# Patient Record
Sex: Female | Born: 1935 | Race: White | Hispanic: No | Marital: Married | Smoking: Never smoker
Health system: Southern US, Community
[De-identification: ages and names within clinical notes are randomized; demographics above are authoritative.]

## PROBLEM LIST (undated history)

## (undated) DIAGNOSIS — I4891 Unspecified atrial fibrillation: Secondary | ICD-10-CM

## (undated) DIAGNOSIS — N390 Urinary tract infection, site not specified: Secondary | ICD-10-CM

## (undated) DIAGNOSIS — M1990 Unspecified osteoarthritis, unspecified site: Secondary | ICD-10-CM

## (undated) DIAGNOSIS — I509 Heart failure, unspecified: Secondary | ICD-10-CM

## (undated) DIAGNOSIS — I10 Essential (primary) hypertension: Secondary | ICD-10-CM

## (undated) DIAGNOSIS — J189 Pneumonia, unspecified organism: Secondary | ICD-10-CM

## (undated) HISTORY — PX: CARDIOVERSION: SHX1299

## (undated) HISTORY — PX: ABDOMINAL HYSTERECTOMY: SHX81

## (undated) HISTORY — PX: HAND SURGERY: SHX662

## (undated) HISTORY — PX: BREAST REDUCTION SURGERY: SHX8

---

## 2011-08-12 ENCOUNTER — Emergency Department (HOSPITAL_BASED_OUTPATIENT_CLINIC_OR_DEPARTMENT_OTHER)
Admission: EM | Admit: 2011-08-12 | Discharge: 2011-08-12 | Disposition: A | Discharge status: Home / Self Care | Payer: Medicare Other | Attending: Emergency Medicine | Admitting: Emergency Medicine

## 2011-08-12 ENCOUNTER — Encounter: Payer: Self-pay | Admitting: *Deleted

## 2011-08-12 ENCOUNTER — Emergency Department (INDEPENDENT_AMBULATORY_CARE_PROVIDER_SITE_OTHER): Payer: Medicare Other

## 2011-08-12 DIAGNOSIS — M25512 Pain in left shoulder: Secondary | ICD-10-CM

## 2011-08-12 DIAGNOSIS — M25519 Pain in unspecified shoulder: Secondary | ICD-10-CM | POA: Insufficient documentation

## 2011-08-12 DIAGNOSIS — I4891 Unspecified atrial fibrillation: Secondary | ICD-10-CM | POA: Insufficient documentation

## 2011-08-12 DIAGNOSIS — T458X1A Poisoning by other primarily systemic and hematological agents, accidental (unintentional), initial encounter: Secondary | ICD-10-CM | POA: Insufficient documentation

## 2011-08-12 DIAGNOSIS — T45511A Poisoning by anticoagulants, accidental (unintentional), initial encounter: Secondary | ICD-10-CM | POA: Insufficient documentation

## 2011-08-12 DIAGNOSIS — M7989 Other specified soft tissue disorders: Secondary | ICD-10-CM | POA: Insufficient documentation

## 2011-08-12 DIAGNOSIS — I509 Heart failure, unspecified: Secondary | ICD-10-CM | POA: Insufficient documentation

## 2011-08-12 DIAGNOSIS — M898X9 Other specified disorders of bone, unspecified site: Secondary | ICD-10-CM

## 2011-08-12 DIAGNOSIS — Z79899 Other long term (current) drug therapy: Secondary | ICD-10-CM | POA: Insufficient documentation

## 2011-08-12 DIAGNOSIS — I10 Essential (primary) hypertension: Secondary | ICD-10-CM | POA: Insufficient documentation

## 2011-08-12 HISTORY — DX: Unspecified osteoarthritis, unspecified site: M19.90

## 2011-08-12 HISTORY — DX: Unspecified atrial fibrillation: I48.91

## 2011-08-12 HISTORY — DX: Essential (primary) hypertension: I10

## 2011-08-12 HISTORY — DX: Heart failure, unspecified: I50.9

## 2011-08-12 HISTORY — DX: Pneumonia, unspecified organism: J18.9

## 2011-08-12 HISTORY — DX: Urinary tract infection, site not specified: N39.0

## 2011-08-12 LAB — DIFFERENTIAL
Eosinophils Relative: 2 % (ref 0–5)
Lymphocytes Relative: 24 % (ref 12–46)
Lymphs Abs: 1.6 10*3/uL (ref 0.7–4.0)
Monocytes Absolute: 0.9 10*3/uL (ref 0.1–1.0)

## 2011-08-12 LAB — CBC
HCT: 33.1 % — ABNORMAL LOW (ref 36.0–46.0)
Hemoglobin: 10.3 g/dL — ABNORMAL LOW (ref 12.0–15.0)
MCV: 95.9 fL (ref 78.0–100.0)
Platelets: 176 10*3/uL (ref 150–400)
RBC: 3.45 MIL/uL — ABNORMAL LOW (ref 3.87–5.11)
WBC: 6.8 10*3/uL (ref 4.0–10.5)

## 2011-08-12 LAB — BASIC METABOLIC PANEL
CO2: 32 mEq/L (ref 19–32)
Calcium: 9.4 mg/dL (ref 8.4–10.5)
Glucose, Bld: 100 mg/dL — ABNORMAL HIGH (ref 70–99)
Sodium: 142 mEq/L (ref 135–145)

## 2011-08-12 LAB — PROTIME-INR: INR: 3.32 — ABNORMAL HIGH (ref 0.00–1.49)

## 2011-08-12 NOTE — ED Provider Notes (Signed)
History     CSN: [id number]  Arrival date & time 08/12/11  1614   First MD Initiated Contact with Patient 08/12/11 1919      Chief Complaint  Patient presents with  . Bleeding/Bruising    (Consider location/radiation/quality/duration/timing/severity/associated sxs/prior treatment) HPI Comments: Pt doesn't recall any trauma to hand.  She was washing dishes ~ 1500 today and noticed that her L hand was swollen and bruised.  She denies pain.  Last INR check was over 2 weeks ago.  Also, has L shoulder pain.  Seen by PCP yest and had EKG which she was told "showed nothing".  The history is provided by the patient and the spouse. No language interpreter was used.    Past Medical History  Diagnosis Date  . Atrial fibrillation   . CHF (congestive heart failure)   . Arthritis   . Hypertension   . UTI (lower urinary tract infection)   . Pneumonia     Past Surgical History  Procedure Date  . Cardioversion   . Breast reduction surgery   . Abdominal hysterectomy   . Hand surgery     History reviewed. No pertinent family history.  History  Substance Use Topics  . Smoking status: Never Smoker   . Smokeless tobacco: Never Used  . Alcohol Use: No    OB History    Grav Para Term Preterm Abortions TAB SAB Ect Mult Living                  Review of Systems  Musculoskeletal:       Swelling and bruising.  Also, L shoulder pain  All other systems reviewed and are negative.    Allergies  Ace inhibitors; Colchicine; Crestor; Hydrocodone-acetaminophen; Lipitor; Nsaids; Pravachol; Tramadol; and Zocor  Home Medications   Current Outpatient Rx  Name Route Sig Dispense Refill  . ACETAMINOPHEN 325 MG PO TABS Oral Take 325-650 mg by mouth every 6 (six) hours as needed. For pain     . AMIODARONE HCL 400 MG PO TABS Oral Take 400 mg by mouth daily.      Marland Kitchen CALCIUM CARBONATE-VITAMIN D 600-200 MG-UNIT PO TABS Oral Take 1 tablet by mouth 2 (two) times daily.      Marland Kitchen VITAMIN D 1000 UNITS  PO TABS Oral Take 1,000 Units by mouth daily.      . OMEGA-3 FATTY ACIDS 1000 MG PO CAPS Oral Take 2 g by mouth 2 (two) times daily.      Marland Kitchen LEVOTHYROXINE SODIUM 50 MCG PO TABS Oral Take 50 mcg by mouth daily.      . ADULT MULTIVITAMIN W/MINERALS CH Oral Take 1 tablet by mouth daily.      Marland Kitchen MELATONIN PO Oral Take 2 mcg by mouth at bedtime.      Marland Kitchen POLYETHYLENE GLYCOL 3350 PO POWD Oral Take 17 g by mouth daily.      Marland Kitchen POLYSACCHARIDE IRON 150 MG PO CAPS Oral Take 150 mg by mouth daily.      . RED YEAST RICE 600 MG PO CAPS Oral Take 1,200 mg by mouth daily.      . TORSEMIDE 20 MG PO TABS Oral Take 60 mg by mouth daily.     . WARFARIN SODIUM 4 MG PO TABS Oral Take 2-4 mg by mouth daily. Take 1 tab on Monday, Tuesday, Wednesday, Thursday and Friday and take 1/2 tab on Saturday and Sunday     . NITROGLYCERIN 0.4 MG SL SUBL Sublingual Place 0.4 mg under  the tongue every 5 (five) minutes as needed. For chest pain       BP 142/76  Pulse 71  Temp(Src) 97.7 F (36.5 C) (Oral)  Resp 16  SpO2 97%  Physical Exam  Nursing note and vitals reviewed. Constitutional: She is oriented to person, place, and time. She appears well-developed and well-nourished. No distress.  HENT:  Head: Normocephalic and atraumatic.  Eyes: EOM are normal.  Neck: Normal range of motion.  Cardiovascular: Normal rate, regular rhythm and normal heart sounds.   Pulmonary/Chest: Effort normal and breath sounds normal.  Abdominal: Soft. She exhibits no distension. There is no tenderness.  Musculoskeletal: She exhibits no tenderness.       Left shoulder: She exhibits decreased range of motion, tenderness, bony tenderness and pain. She exhibits no deformity, no spasm and normal strength.       Left hand: She exhibits swelling. She exhibits normal range of motion, no tenderness, no bony tenderness, normal capillary refill, no deformity and no laceration. normal sensation noted. Normal strength noted.       Hands: Neurological: She is  alert and oriented to person, place, and time.  Skin: Skin is warm and dry.  Psychiatric: She has a normal mood and affect. Judgment normal.    ED Course  Procedures (including critical care time)  Labs Reviewed  CBC - Abnormal; Notable for the following:    RBC 3.45 (*)    Hemoglobin 10.3 (*)    HCT 33.1 (*)    All other components within normal limits  DIFFERENTIAL - Abnormal; Notable for the following:    Monocytes Relative 13 (*)    All other components within normal limits  BASIC METABOLIC PANEL - Abnormal; Notable for the following:    Glucose, Bld 100 (*)    BUN 57 (*)    Creatinine, Ser 2.50 (*)    GFR calc non Af Amer 16 (*)    GFR calc Af Amer 19 (*)    All other components within normal limits  PROTIME-INR - Abnormal; Notable for the following:    Prothrombin Time 34.2 (*)    INR 3.32 (*)    All other components within normal limits   Dg Shoulder Left  08/12/2011  *RADIOLOGY REPORT*  Clinical Data: Left shoulder pain, no known injury  LEFT SHOULDER - 2+ VIEW  Comparison: None  Findings: AC joint alignment normal. Diffuse osseous demineralization. Prominent inferior acromial spur. No glenohumeral fracture or dislocation. Visualized left ribs intact. Bronchitic changes identified.  IMPRESSION: No acute bony abnormalities. Osseous demineralization. Prominent inferior acromial spur, which may predispose the patient to rotator cuff pathology.  Original Report Authenticated By: Lollie Marrow, M.D.     No diagnosis found.    MDM         Worthy Rancher, PA 08/12/11 2111

## 2011-08-12 NOTE — ED Notes (Signed)
Pt reports left arm numb yesterday- then later in the day began having pain- left hand had small bruise- went to PCP and had ekg done yesterday- today woke up with marked swelling and bruising to left- pt on coumadin

## 2011-08-13 NOTE — ED Provider Notes (Signed)
Medical screening examination/treatment/procedure(s) were performed by non-physician practitioner and as supervising physician I was immediately available for consultation/collaboration.   Rolan Bucco, MD 08/13/11 [phone]

## 2012-08-23 ENCOUNTER — Encounter (HOSPITAL_COMMUNITY): Payer: Self-pay | Admitting: *Deleted

## 2012-08-23 ENCOUNTER — Emergency Department (HOSPITAL_COMMUNITY)
Admission: EM | Admit: 2012-08-23 | Discharge: 2012-08-23 | Disposition: A | Discharge status: Home / Self Care | Payer: Medicare Other | Attending: Emergency Medicine | Admitting: Emergency Medicine

## 2012-08-23 ENCOUNTER — Emergency Department (HOSPITAL_COMMUNITY): Payer: Medicare Other

## 2012-08-23 DIAGNOSIS — Z87440 Personal history of urinary (tract) infections: Secondary | ICD-10-CM | POA: Insufficient documentation

## 2012-08-23 DIAGNOSIS — Z79899 Other long term (current) drug therapy: Secondary | ICD-10-CM | POA: Insufficient documentation

## 2012-08-23 DIAGNOSIS — W19XXXA Unspecified fall, initial encounter: Secondary | ICD-10-CM

## 2012-08-23 DIAGNOSIS — Y939 Activity, unspecified: Secondary | ICD-10-CM | POA: Insufficient documentation

## 2012-08-23 DIAGNOSIS — T148XXA Other injury of unspecified body region, initial encounter: Secondary | ICD-10-CM | POA: Insufficient documentation

## 2012-08-23 DIAGNOSIS — Z8739 Personal history of other diseases of the musculoskeletal system and connective tissue: Secondary | ICD-10-CM | POA: Insufficient documentation

## 2012-08-23 DIAGNOSIS — I509 Heart failure, unspecified: Secondary | ICD-10-CM | POA: Insufficient documentation

## 2012-08-23 DIAGNOSIS — Z8701 Personal history of pneumonia (recurrent): Secondary | ICD-10-CM | POA: Insufficient documentation

## 2012-08-23 DIAGNOSIS — I10 Essential (primary) hypertension: Secondary | ICD-10-CM | POA: Insufficient documentation

## 2012-08-23 DIAGNOSIS — Z8679 Personal history of other diseases of the circulatory system: Secondary | ICD-10-CM | POA: Insufficient documentation

## 2012-08-23 DIAGNOSIS — R296 Repeated falls: Secondary | ICD-10-CM | POA: Insufficient documentation

## 2012-08-23 DIAGNOSIS — Y929 Unspecified place or not applicable: Secondary | ICD-10-CM | POA: Insufficient documentation

## 2012-08-23 NOTE — ED Provider Notes (Signed)
History  This chart was scribed for non-physician practitioner working with Celene Kras, MD by Ardeen Jourdain, ED Scribe. This patient was seen in room TR11C/TR11C and the patient's care was started at 1852.  CSN: [id number]  Arrival date & time 08/23/12  1740   First MD Initiated Contact with Patient 08/23/12 1852      Chief Complaint  Patient presents with  . Fall     The history is provided by the patient. No language interpreter was used.    Jennifer Bender is a 79 y.o. female who presents to the Emergency Department complaining of left upper arm pain from a fall. She states she was trying to sit on a stool when it rolled backwards and she fell straight to the ground. She denies any LOC, head injury and any other injuries at this time. She states she is ambulating normally and with out pain.     Past Medical History  Diagnosis Date  . Atrial fibrillation   . CHF (congestive heart failure)   . Arthritis   . Hypertension   . UTI (lower urinary tract infection)   . Pneumonia     Past Surgical History  Procedure Date  . Cardioversion   . Breast reduction surgery   . Abdominal hysterectomy   . Hand surgery     History reviewed. No pertinent family history.  History  Substance Use Topics  . Smoking status: Never Smoker   . Smokeless tobacco: Never Used  . Alcohol Use: No   No OB history available.   Review of Systems  Musculoskeletal:       Left upper arm pain  Skin:       Bruising to left upper arm  All other systems reviewed and are negative.    Allergies  Ace inhibitors; Colchicine; Crestor; Hydrocodone-acetaminophen; Lipitor; Nsaids; Pravachol; Tramadol; and Zocor  Home Medications   Current Outpatient Rx  Name  Route  Sig  Dispense  Refill  . ACETAMINOPHEN 325 MG PO TABS   Oral   Take 325-650 mg by mouth every 6 (six) hours as needed. For pain          . AMIODARONE HCL 400 MG PO TABS   Oral   Take 400 mg by mouth daily.           Marland Kitchen  CALCIUM CARBONATE-VITAMIN D 600-200 MG-UNIT PO TABS   Oral   Take 1 tablet by mouth 2 (two) times daily.           Marland Kitchen VITAMIN D 1000 UNITS PO TABS   Oral   Take 1,000 Units by mouth daily.           . OMEGA-3 FATTY ACIDS 1000 MG PO CAPS   Oral   Take 2 g by mouth 2 (two) times daily.           Marland Kitchen LEVOTHYROXINE SODIUM 50 MCG PO TABS   Oral   Take 50 mcg by mouth daily.           . ADULT MULTIVITAMIN W/MINERALS CH   Oral   Take 1 tablet by mouth daily.           Marland Kitchen NITROGLYCERIN 0.4 MG SL SUBL   Sublingual   Place 0.4 mg under the tongue every 5 (five) minutes as needed. For chest pain          . POLYSACCHARIDE IRON 150 MG PO CAPS   Oral   Take 150 mg by mouth daily.           Marland Kitchen  RED YEAST RICE 600 MG PO CAPS   Oral   Take 1,200 mg by mouth daily.           . TORSEMIDE 20 MG PO TABS   Oral   Take 60 mg by mouth daily.            Triage Vitals: BP 113/29  Pulse 64  Resp 14  SpO2 99%  Physical Exam  Nursing note and vitals reviewed. Constitutional: She is oriented to person, place, and time. She appears well-developed and well-nourished. No distress.  HENT:  Head: Normocephalic and atraumatic.  Eyes: EOM are normal. Pupils are equal, round, and reactive to light.  Neck: Normal range of motion. Neck supple. No tracheal deviation present.  Cardiovascular: Normal rate.   Pulmonary/Chest: Effort normal. No respiratory distress.  Abdominal: Soft. She exhibits no distension.  Musculoskeletal: Normal range of motion. She exhibits no edema.       Left arm TTP over biceps, with mild bruising, ROM 5/5, strength 5/5,   Neurological: She is alert and oriented to person, place, and time.  Skin: Skin is warm and dry.  Psychiatric: She has a normal mood and affect. Her behavior is normal.    ED Course  Procedures (including critical care time)  DIAGNOSTIC STUDIES: Oxygen Saturation is 99% on room air, normal by my interpretation.    COORDINATION OF  CARE:  7:22 PM: Discussed treatment plan which includes x-ray of the area with pt at bedside and pt agreed to plan.  7:29 PM: Rechecked BP it is 124/58, pt is asymptomatic for lightheadedness and dizziness. Pt has normal gait.    Labs Reviewed - No data to display Dg Humerus Left  08/23/2012  *RADIOLOGY REPORT*  Clinical Data: Larey Seat and injured left upper arm.  LEFT HUMERUS - 2+ VIEW  Comparison: Left shoulder x-rays 08/12/2011.  No prior humerus imaging.  Findings: No acute fractures involving the humerus.  Generalized loss of bone mineral density.  Mild degenerative changes involving the shoulder as noted previously.  IMPRESSION: No acute osseous abnormality.  Osseous demineralization.   Original Report Authenticated By: Hulan Saas, M.D.      1. Fall   2. Muscle strain       MDM  This is an 79 year old female, who fell out of a chair, as it rolled backwards. She is complaining of upper arm pain, which was likely due to catching herself. Plain films are negative. Patient had low blood pressure on initial reading, but when taken manually blood pressure was improved. She is asymptomatic. Feel that she is stable and ready for discharge.      I personally performed the services described in this documentation, which was scribed in my presence. The recorded information has been reviewed and is accurate.     Roxy Horseman, PA-C 08/23/12 1935

## 2012-08-23 NOTE — ED Notes (Signed)
Reports visiting family upstairs, went to sit on a stool and it rolled backwards and she fell. Only complaint is left upper arm pain, denies hitting her head or loc. Bruising noted, no obv deformity noted, +left radial pulse present.

## 2012-08-24 NOTE — ED Provider Notes (Signed)
Medical screening examination/treatment/procedure(s) were performed by non-physician practitioner and as supervising physician I was immediately available for consultation/collaboration.    Esmeralda Blanford R Lewie Deman, MD 08/24/12 0030 

## 2018-02-20 ENCOUNTER — Other Ambulatory Visit: Payer: Self-pay

## 2018-02-20 ENCOUNTER — Emergency Department (HOSPITAL_COMMUNITY)
Admission: EM | Admit: 2018-02-20 | Discharge: 2018-02-20 | Disposition: A | Discharge status: Home / Self Care | Payer: Medicare Other | Attending: Emergency Medicine | Admitting: Emergency Medicine

## 2018-02-20 ENCOUNTER — Emergency Department (HOSPITAL_COMMUNITY): Payer: Medicare Other

## 2018-02-20 ENCOUNTER — Encounter (HOSPITAL_COMMUNITY): Payer: Self-pay

## 2018-02-20 DIAGNOSIS — S0990XA Unspecified injury of head, initial encounter: Secondary | ICD-10-CM | POA: Diagnosis present

## 2018-02-20 DIAGNOSIS — Y999 Unspecified external cause status: Secondary | ICD-10-CM | POA: Insufficient documentation

## 2018-02-20 DIAGNOSIS — Y9301 Activity, walking, marching and hiking: Secondary | ICD-10-CM | POA: Diagnosis not present

## 2018-02-20 DIAGNOSIS — Y92129 Unspecified place in nursing home as the place of occurrence of the external cause: Secondary | ICD-10-CM | POA: Diagnosis not present

## 2018-02-20 DIAGNOSIS — M25522 Pain in left elbow: Secondary | ICD-10-CM | POA: Diagnosis not present

## 2018-02-20 DIAGNOSIS — I110 Hypertensive heart disease with heart failure: Secondary | ICD-10-CM | POA: Diagnosis not present

## 2018-02-20 DIAGNOSIS — S0191XA Laceration without foreign body of unspecified part of head, initial encounter: Secondary | ICD-10-CM | POA: Insufficient documentation

## 2018-02-20 DIAGNOSIS — F0390 Unspecified dementia without behavioral disturbance: Secondary | ICD-10-CM | POA: Insufficient documentation

## 2018-02-20 DIAGNOSIS — S0083XA Contusion of other part of head, initial encounter: Secondary | ICD-10-CM

## 2018-02-20 DIAGNOSIS — Z79899 Other long term (current) drug therapy: Secondary | ICD-10-CM | POA: Diagnosis not present

## 2018-02-20 DIAGNOSIS — I509 Heart failure, unspecified: Secondary | ICD-10-CM | POA: Insufficient documentation

## 2018-02-20 DIAGNOSIS — W0110XA Fall on same level from slipping, tripping and stumbling with subsequent striking against unspecified object, initial encounter: Secondary | ICD-10-CM | POA: Insufficient documentation

## 2018-02-20 DIAGNOSIS — T148XXA Other injury of unspecified body region, initial encounter: Secondary | ICD-10-CM

## 2018-02-20 LAB — BASIC METABOLIC PANEL
Anion gap: 11 (ref 5–15)
BUN: 64 mg/dL — AB (ref 8–23)
CHLORIDE: 99 mmol/L (ref 98–111)
CO2: 28 mmol/L (ref 22–32)
CREATININE: 2.18 mg/dL — AB (ref 0.44–1.00)
Calcium: 9 mg/dL (ref 8.9–10.3)
GFR calc Af Amer: 21 mL/min — ABNORMAL LOW (ref >60)
GFR, EST NON AFRICAN AMERICAN: 18 mL/min — AB (ref >60)
GLUCOSE: 136 mg/dL — AB (ref 70–99)
POTASSIUM: 4.4 mmol/L (ref 3.5–5.1)
SODIUM: 138 mmol/L (ref 135–145)

## 2018-02-20 LAB — CBC WITH DIFFERENTIAL/PLATELET
BASOS ABS: 0 10*3/uL (ref 0.0–0.1)
Basophils Relative: 0 %
Eosinophils Absolute: 0.1 10*3/uL (ref 0.0–0.7)
Eosinophils Relative: 2 %
HCT: 36.2 % (ref 36.0–46.0)
Hemoglobin: 11.7 g/dL — ABNORMAL LOW (ref 12.0–15.0)
LYMPHS PCT: 15 %
Lymphs Abs: 1.4 10*3/uL (ref 0.7–4.0)
MCH: 31.9 pg (ref 26.0–34.0)
MCHC: 32.3 g/dL (ref 30.0–36.0)
MCV: 98.6 fL (ref 78.0–100.0)
MONO ABS: 1.1 10*3/uL — AB (ref 0.1–1.0)
Monocytes Relative: 12 %
Neutro Abs: 6.7 10*3/uL (ref 1.7–7.7)
Neutrophils Relative %: 71 %
PLATELETS: 210 10*3/uL (ref 150–400)
RBC: 3.67 MIL/uL — ABNORMAL LOW (ref 3.87–5.11)
RDW: 13.4 % (ref 11.5–15.5)
WBC: 9.3 10*3/uL (ref 4.0–10.5)

## 2018-02-20 MED ORDER — SODIUM CHLORIDE 0.9 % IV BOLUS
500.0000 mL | Freq: Once | INTRAVENOUS | Status: AC
  Administered 2018-02-20: 500 mL via INTRAVENOUS

## 2018-02-20 MED ORDER — FENTANYL CITRATE (PF) 100 MCG/2ML IJ SOLN
50.0000 ug | Freq: Once | INTRAMUSCULAR | Status: AC
  Administered 2018-02-20: 50 ug via INTRAVENOUS
  Filled 2018-02-20: qty 2

## 2018-02-20 NOTE — ED Provider Notes (Signed)
[facility]-EMERGENCY DEPT Provider Note   CSN: [id number] Arrival date & time: 02/20/18  1725     History   Chief Complaint Chief Complaint  Patient presents with  . Fall    HPI Jennifer Bender is a 84 y.o. female with PMH/o Arthritis, Afib, CHF, HTN, Pneumonia BIB EMS who presents for evaluation of mechanical fall that occurred just prior to ED arrival.  Patient reports that she was walking back into the nursing home facility when she tripped over the cement, causing her to fall forward.  Unclear if any LOC.  Patient states she did not have any preceding chest pain or dizziness prior to onset of fall.  Patient reports that she landed on her knees and then her face and elbow. Patient reports that when she initially was evaluated by EMS she had some neck pain but states not anymore. Denies any back pain. On ED arrival, patient is complaining of headache, facial pain.  Additionally, she has left elbow and upper arm pain.  Patient reports that she hit both of her knees when she fell and that they are both causing her pain.  She feels like they are more swollen than they normally are.  Patient is not currently on blood thinners.  Patient denies any vision changes, chest pain, difficulty breathing, abdominal pain, nausea/vomiting, numbness/weakness of her arms or legs.  The history is provided by the patient.    Past Medical History:  Diagnosis Date  . Arthritis   . Atrial fibrillation (HCC)   . CHF (congestive heart failure) (HCC)   . Hypertension   . Pneumonia   . UTI (lower urinary tract infection)     There are no active problems to display for this patient.   Past Surgical History:  Procedure Laterality Date  . ABDOMINAL HYSTERECTOMY    . BREAST REDUCTION SURGERY    . CARDIOVERSION    . HAND SURGERY       OB History   None      Home Medications    Prior to Admission medications   Medication Sig Start Date End Date Taking? Authorizing  Provider  diltiazem (DILACOR XR) 240 MG 24 hr capsule Take 240 mg by mouth daily.   Yes [provider]  donepezil (ARICEPT) 10 MG tablet Take 10 mg by mouth daily.   Yes [provider]  DULoxetine (CYMBALTA) 30 MG capsule Take 30 mg by mouth daily.   Yes [provider]  fish oil-omega-3 fatty acids 1000 MG capsule Take 1 g by mouth daily.    Yes [provider]  levothyroxine (SYNTHROID, LEVOTHROID) 88 MCG tablet Take 88 mcg by mouth daily before breakfast.   Yes [provider]  Melatonin 5 MG TABS Take 5 mg by mouth at bedtime as needed (sleep).   Yes [provider]  Multiple Vitamin (MULITIVITAMIN WITH MINERALS) TABS Take 1 tablet by mouth daily.     Yes [provider]  potassium chloride (K-DUR,KLOR-CON) 10 MEQ tablet Take 10 mEq by mouth daily.   Yes [provider]  torsemide (DEMADEX) 20 MG tablet Take 40 mg by mouth 2 (two) times daily.    Yes [provider]    Family History History reviewed. No pertinent family history.  Social History Social History   Tobacco Use  . Smoking status: Never Smoker  . Smokeless tobacco: Never Used  Substance Use Topics  . Alcohol use: No  . Drug use: No     Allergies  Ace inhibitors; Colchicine; Crestor [rosuvastatin calcium]; Hydrocodone-acetaminophen; Lipitor [atorvastatin calcium]; Morphine and related; Nsaids; Pravachol; Rosuvastatin; Tramadol; and Zocor [simvastatin]   Review of Systems Review of Systems  Constitutional: Negative for fever.  Respiratory: Negative for cough and shortness of breath.   Cardiovascular: Negative for chest pain.  Gastrointestinal: Negative for abdominal pain, nausea and vomiting.  Genitourinary: Negative for dysuria and hematuria.  Musculoskeletal: Positive for neck pain. Negative for back pain.       Left arm pain, bilateral knee pain  Neurological: Positive for headaches.     Physical Exam Updated Vital  Signs BP (!) 128/108 (BP Location: Right Arm)   Pulse 66   Temp 98.6 F (37 C) (Oral)   Resp 14   SpO2 98%   Physical Exam  Constitutional: She is oriented to person, place, and time. She appears well-developed and well-nourished.  HENT:  Head: Normocephalic and atraumatic.    Nose: Sinus tenderness present. No nasal septal hematoma.  Mouth/Throat: Oropharynx is clear and moist and mucous membranes are normal.  No skull deformities or crepitus noted.  Dried blood noted on the left side but no underlying wound, abrasion, laceration.  Tenderness to palpation noted to the nasal bridge with some obvious swelling.  No evidence of septal hematoma.  Eyes: Pupils are equal, round, and reactive to light. Conjunctivae, EOM and lids are normal.  EOMs intact without any difficulty.  Neck:  C-collar in place.  Cardiovascular: Normal rate, regular rhythm, normal heart sounds and normal pulses. Exam reveals no gallop and no friction rub.  No murmur heard. Pulses:      Radial pulses are 2+ on the right side, and 2+ on the left side.       Dorsalis pedis pulses are 2+ on the right side, and 2+ on the left side.  Pulmonary/Chest: Effort normal and breath sounds normal.  Lungs clear to auscultation bilaterally.  Symmetric chest rise.  No wheezing, rales, rhonchi.  Abdominal: Soft. Normal appearance. There is no tenderness. There is no rigidity and no guarding.  Musculoskeletal: Normal range of motion.  Tenderness palpation noted to left humerus, left elbow, left forearm.  No deformity or crepitus noted.  Limited range of motion secondary to tenderness.  Skin tear noted to the lateral aspect of the left forearm.  No tenderness palpation noted to wrist, hand.  No tenderness noted to the right upper extremity.  Tenderness palpation noted to the right knee with some overlying soft tissue swelling that appears to be chronic in nature.  No deformity or crepitus noted.  Flexion/extension intact.  Tenderness  palpation noted to the left knee with some overlying soft tissue swelling that appears to be chronic in nature.  No deformity or crepitus noted.  Flexion/extension of left lower extremity intact any difficulty.  No tenderness palpation to distal tib-fib, ankles bilaterally.  Neurological: She is alert and oriented to person, place, and time.  Cranial nerves III-XII intact Follows commands, Moves all extremities  5/5 strength to BUE and BLE  Sensation intact throughout all major nerve distributions Normal finger to nose. No slurred speech. No facial droop.   Skin: Skin is warm and dry. Capillary refill takes less than 2 seconds.  Skin tear noted to left upper extremity.  Small 0.5 cm wound noted to the frontal scalp.  No evidence of deep laceration.  Oozing after cleaning.  Psychiatric: She has a normal mood and affect. Her speech is normal.  Nursing note and vitals reviewed.    ED Treatments /  Results  Labs (all labs ordered are listed, but only abnormal results are displayed) Labs Reviewed  CBC WITH DIFFERENTIAL/PLATELET - Abnormal; Notable for the following components:      Result Value   RBC 3.67 (*)    Hemoglobin 11.7 (*)    Monocytes Absolute 1.1 (*)    All other components within normal limits  BASIC METABOLIC PANEL - Abnormal; Notable for the following components:   Glucose, Bld 136 (*)    BUN 64 (*)    Creatinine, Ser 2.18 (*)    GFR calc non Af Amer 18 (*)    GFR calc Af Amer 21 (*)    All other components within normal limits    EKG EKG Interpretation  Date/Time:  Wednesday February 20 2018 19:03:58 EDT Ventricular Rate:  65 PR Interval:    QRS Duration: 156 QT Interval:  431 QTC Calculation: 431 R Axis:   -102 Text Interpretation:  Afib/flut and V-paced complexes No further analysis attempted due to paced rhythm No previous ECGs available Confirmed by Richardean Canal [phone]) on 02/20/2018 7:06:15 PM   Radiology Dg Chest 2 View  Result Date: 02/20/2018 CLINICAL DATA:   Fall EXAM: CHEST - 2 VIEW COMPARISON:  02/21/2016 FINDINGS: Left-sided pacing device as before. Cardiomegaly. Aortic atherosclerosis. No acute airspace disease or pleural effusion. No pneumothorax. Old fracture deformity of the proximal right humerus. Advanced arthritis of the left shoulder. IMPRESSION: No active cardiopulmonary disease.  Cardiomegaly. Electronically Signed   By: Jasmine Pang M.D.   On: 02/20/2018 18:48   Dg Pelvis 1-2 Views  Result Date: 02/20/2018 CLINICAL DATA:  Fall EXAM: PELVIS - 1-2 VIEW COMPARISON:  05/15/2017 FINDINGS: Large feces retention in the rectum. Old deformity of the right inferior pubic ramus. Pubic symphysis is intact. SI joint degenerative changes. No acute displaced fracture or malalignment. Vascular calcifications. IMPRESSION: No acute osseous abnormality. Electronically Signed   By: Jasmine Pang M.D.   On: 02/20/2018 18:50   Dg Elbow Complete Left  Result Date: 02/20/2018 CLINICAL DATA:  Fall with elbow pain EXAM: LEFT ELBOW - COMPLETE 3+ VIEW COMPARISON:  None. FINDINGS: Elbow effusion is present. No dislocation. Small cortex lucency at the radial head neck junction on the lateral view. Vascular calcifications. IMPRESSION: Elbow effusion. Small cortex lucency at the radial head neck junction on single view, possible subtle cortex fracture. Electronically Signed   By: Jasmine Pang M.D.   On: 02/20/2018 18:38   Dg Forearm Left  Result Date: 02/20/2018 CLINICAL DATA:  Fall with arm pain EXAM: LEFT FOREARM - 2 VIEW COMPARISON:  12/04/2015 FINDINGS: Bones appear demineralized. Chronic deformity of the first metacarpal. Moderate arthritis at the first [facility] joint. Possible tiny cortex lucency at the head neck junction of the radius. Otherwise no definitive fracture lucency seen. Vascular calcifications. IMPRESSION: No acute osseous abnormality. A possible cortex lucency at the radial head neck junction is better seen on the elbow views. Electronically Signed   By: Jasmine Pang M.D.   On: 02/20/2018 18:44   Ct Head Wo Contrast  Result Date: 02/20/2018 CLINICAL DATA:  Fall, hit face EXAM: CT HEAD WITHOUT CONTRAST CT MAXILLOFACIAL WITHOUT CONTRAST CT CERVICAL SPINE WITHOUT CONTRAST TECHNIQUE: Multidetector CT imaging of the head, cervical spine, and maxillofacial structures were performed using the standard protocol without intravenous contrast. Multiplanar CT image reconstructions of the cervical spine and maxillofacial structures were also generated. COMPARISON:  05/15/2017 FINDINGS: CT HEAD FINDINGS Brain: There is atrophy and chronic small vessel disease changes. No  acute intracranial abnormality. Specifically, no hemorrhage, hydrocephalus, mass lesion, acute infarction, or significant intracranial injury. Vascular: No hyperdense vessel or unexpected calcification. Skull: No acute calvarial abnormality. Other: Soft tissue swelling over the forehead near the midline. CT MAXILLOFACIAL FINDINGS Osseous: No fracture.  Zygomatic arches and mandible intact. Orbits: Negative. No traumatic or inflammatory finding. Sinuses: Mucosal thickening in the ethmoid air cells, right maxillary and right frontal sinus. No air-fluid levels. Mastoids clear. Soft tissues: Soft tissue swelling over the forehead and nose. CT CERVICAL SPINE FINDINGS Alignment: No subluxation Skull base and vertebrae: Severe chronic compression deformity at the T2 vertebral body, stable since prior study. Mild compression deformity of the C5 superior endplate has progressed since prior study. No acute fracture. Soft tissues and spinal canal: No prevertebral fluid or swelling. No visible canal hematoma. Disc levels: Diffuse degenerative disc disease throughout the cervical spine and degenerative facet disease bilaterally. Upper chest: No acute findings Other: No acute findings IMPRESSION: No acute intracranial abnormality. Atrophy, chronic microvascular disease. Soft tissue swelling over the forehead and nose. No facial  fracture. Diffuse degenerative disc and facet disease in the cervical spine. Chronic compression deformity at T2 is stable. Mild compression deformity through the superior endplate of C5 has progressed since prior study. No acute fracture. Electronically Signed   By: Charlett NoseKevin  Dover M.D.   On: 02/20/2018 19:10   Ct Cervical Spine Wo Contrast  Result Date: 02/20/2018 CLINICAL DATA:  Fall, hit face EXAM: CT HEAD WITHOUT CONTRAST CT MAXILLOFACIAL WITHOUT CONTRAST CT CERVICAL SPINE WITHOUT CONTRAST TECHNIQUE: Multidetector CT imaging of the head, cervical spine, and maxillofacial structures were performed using the standard protocol without intravenous contrast. Multiplanar CT image reconstructions of the cervical spine and maxillofacial structures were also generated. COMPARISON:  05/15/2017 FINDINGS: CT HEAD FINDINGS Brain: There is atrophy and chronic small vessel disease changes. No acute intracranial abnormality. Specifically, no hemorrhage, hydrocephalus, mass lesion, acute infarction, or significant intracranial injury. Vascular: No hyperdense vessel or unexpected calcification. Skull: No acute calvarial abnormality. Other: Soft tissue swelling over the forehead near the midline. CT MAXILLOFACIAL FINDINGS Osseous: No fracture.  Zygomatic arches and mandible intact. Orbits: Negative. No traumatic or inflammatory finding. Sinuses: Mucosal thickening in the ethmoid air cells, right maxillary and right frontal sinus. No air-fluid levels. Mastoids clear. Soft tissues: Soft tissue swelling over the forehead and nose. CT CERVICAL SPINE FINDINGS Alignment: No subluxation Skull base and vertebrae: Severe chronic compression deformity at the T2 vertebral body, stable since prior study. Mild compression deformity of the C5 superior endplate has progressed since prior study. No acute fracture. Soft tissues and spinal canal: No prevertebral fluid or swelling. No visible canal hematoma. Disc levels: Diffuse degenerative disc  disease throughout the cervical spine and degenerative facet disease bilaterally. Upper chest: No acute findings Other: No acute findings IMPRESSION: No acute intracranial abnormality. Atrophy, chronic microvascular disease. Soft tissue swelling over the forehead and nose. No facial fracture. Diffuse degenerative disc and facet disease in the cervical spine. Chronic compression deformity at T2 is stable. Mild compression deformity through the superior endplate of C5 has progressed since prior study. No acute fracture. Electronically Signed   By: Charlett NoseKevin  Dover M.D.   On: 02/20/2018 19:10   Dg Knee Complete 4 Views Left  Result Date: 02/20/2018 CLINICAL DATA:  Fall with knee pain EXAM: LEFT KNEE - COMPLETE 4+ VIEW COMPARISON:  05/15/2017 FINDINGS: No fracture or malalignment. Vascular calcification. Moderate arthritis of the medial compartment with marked arthritis of the lateral compartment. Mild patellofemoral degenerative  change. No significant knee effusion. Vascular calcifications. IMPRESSION: Arthritis of the left knee.  No acute osseous abnormality. Electronically Signed   By: Jasmine Pang M.D.   On: 02/20/2018 18:41   Dg Knee Complete 4 Views Right  Result Date: 02/20/2018 CLINICAL DATA:  Fall with pain EXAM: RIGHT KNEE - COMPLETE 4+ VIEW COMPARISON:  05/15/2017 FINDINGS: Small knee effusion. No acute displaced fracture seen. Surgical plate and multiple screw fixation of the distal femur across old distal femoral fracture deformity. Moderate severe arthritis of the medial and lateral compartments of the knee. Extensive vascular calcification IMPRESSION: Status post surgical plate and screw fixation of old distal femoral fracture. No definite acute osseous abnormality is seen. There is a small knee effusion. Electronically Signed   By: Jasmine Pang M.D.   On: 02/20/2018 18:46   Dg Humerus Left  Result Date: 02/20/2018 CLINICAL DATA:  Fall with arm pain EXAM: LEFT HUMERUS - 2+ VIEW COMPARISON:   05/15/2018 FINDINGS: No acute displaced fracture is seen. Bones appear demineralized. High-riding humeral head compatible with rotator cuff disease. Arthritis at the glenohumeral interval. IMPRESSION: No acute osseous abnormality. High-riding position of the humeral head suspicious for rotator cuff disease. Marked arthritis at the glenohumeral interval. Electronically Signed   By: Jasmine Pang M.D.   On: 02/20/2018 18:40   Ct Maxillofacial Wo Contrast  Result Date: 02/20/2018 CLINICAL DATA:  Fall, hit face EXAM: CT HEAD WITHOUT CONTRAST CT MAXILLOFACIAL WITHOUT CONTRAST CT CERVICAL SPINE WITHOUT CONTRAST TECHNIQUE: Multidetector CT imaging of the head, cervical spine, and maxillofacial structures were performed using the standard protocol without intravenous contrast. Multiplanar CT image reconstructions of the cervical spine and maxillofacial structures were also generated. COMPARISON:  05/15/2017 FINDINGS: CT HEAD FINDINGS Brain: There is atrophy and chronic small vessel disease changes. No acute intracranial abnormality. Specifically, no hemorrhage, hydrocephalus, mass lesion, acute infarction, or significant intracranial injury. Vascular: No hyperdense vessel or unexpected calcification. Skull: No acute calvarial abnormality. Other: Soft tissue swelling over the forehead near the midline. CT MAXILLOFACIAL FINDINGS Osseous: No fracture.  Zygomatic arches and mandible intact. Orbits: Negative. No traumatic or inflammatory finding. Sinuses: Mucosal thickening in the ethmoid air cells, right maxillary and right frontal sinus. No air-fluid levels. Mastoids clear. Soft tissues: Soft tissue swelling over the forehead and nose. CT CERVICAL SPINE FINDINGS Alignment: No subluxation Skull base and vertebrae: Severe chronic compression deformity at the T2 vertebral body, stable since prior study. Mild compression deformity of the C5 superior endplate has progressed since prior study. No acute fracture. Soft tissues and  spinal canal: No prevertebral fluid or swelling. No visible canal hematoma. Disc levels: Diffuse degenerative disc disease throughout the cervical spine and degenerative facet disease bilaterally. Upper chest: No acute findings Other: No acute findings IMPRESSION: No acute intracranial abnormality. Atrophy, chronic microvascular disease. Soft tissue swelling over the forehead and nose. No facial fracture. Diffuse degenerative disc and facet disease in the cervical spine. Chronic compression deformity at T2 is stable. Mild compression deformity through the superior endplate of C5 has progressed since prior study. No acute fracture. Electronically Signed   By: Charlett Nose M.D.   On: 02/20/2018 19:10    Procedures .Marland KitchenLaceration Repair Date/Time: 02/20/2018 10:00 PM Performed by: Maxwell Caul, PA-C Authorized by: Maxwell Caul, PA-C   Consent:    Consent obtained:  Verbal   Consent given by:  Patient   Risks discussed:  Infection, pain, retained foreign body, poor cosmetic result and poor wound healing Anesthesia (see MAR for  exact dosages):    Anesthesia method:  None Laceration details:    Location:  Scalp   Scalp location:  Frontal   Length (cm):  0.5 Repair type:    Repair type:  Simple Treatment:    Area cleansed with:  Soap and water   Amount of cleaning:  Standard   Irrigation solution:  Sterile saline Skin repair:    Repair method:  Tissue adhesive Approximation:    Approximation:  Close Post-procedure details:    Patient tolerance of procedure:  Tolerated well, no immediate complications   (including critical care time)  Medications Ordered in ED Medications  sodium chloride 0.9 % bolus 500 mL (0 mLs Intravenous Stopped 02/20/18 2010)  fentaNYL (SUBLIMAZE) injection 50 mcg (50 mcg Intravenous Given 02/20/18 2008)     Initial Impression / Assessment and Plan / ED Course  I have reviewed the triage vital signs and the nursing notes.  Pertinent labs & imaging results  that were available during my care of the patient were reviewed by me and considered in my medical decision making (see chart for details).     84 year old female with possible history of  BIB EMS for evaluation of mechanical fall that occurred just prior to ED arrival.  Patient states she tripped on the cement after getting off of a car walking into the nursing home.  No preceding chest pain, dizziness.  Unclear if LOC.  She is not on any blood thinners. Patient is afebrile , non-toxic appearing, sitting comfortably on examination table. Vital signs reviewed. Ptient's blood pressure is 94/71.  Vital signs otherwise stable.  On exam, patient with a hematoma with overlying abrasion noted to the right forehead.  She also has some swelling tenderness to nasal bridge.  Tenderness to the left upper extremity.  We will plan for CT head, CT C-spine, CT maxilla facial.  Will get x-ray imaging of left upper extremity and bilateral knees.  Additionally, will order basic labs, EKG given low blood pressure.  CT head shows no evidence of acute intracranial abnormality, skull fracture.  CT maxillofacial shows no evidence of facial fracture.  CT cervical spine shows no evidence of acute cervical fracture.  She has evidence of a compression deformity through the superior endplate of C5 but was seen on previous study.  Pelvis x-ray negative for any acute abnormality.  Chest x-ray negative for any acute abnormality.  X-ray shows small elbow effusion.  There is a small questionable cortex lucency at the radial head neck junction on single view.  Possible subtle cortex fracture.  Humerus x-ray negative for any acute abnormality.  Bilateral knee x-rays are without any acute abnormality.  Given concerns of possible small elbow fracture and the fact the patient has a history of Alzheimer's and will intermittently become confused, discussed with Dr. Silverio Lay feel that the best plan would be to go ahead and splint her in the ED to limit  any movement with plans to follow-up with orthopedics.  Patient had a 0.5 cm small wound that was not extensive enough to require suture repair.  It continued to ooze after cleaning.  A small amount of tissue adhesive was applied.  Patient tolerated procedure well.  Reevaluation after splint placement.  Patient with good distal cap refill.  She can move all 5 digits of her left upper extremity without any difficulty.  Patient stable for discharge back to nursing home.  Instructions provided on patient's AVS for further follow-up with orthopedics.   Final Clinical Impressions(s) /  ED Diagnoses   Final diagnoses:  Contusion of face, initial encounter  Hematoma  Left elbow pain    ED Discharge Orders    None       Rosana Hoes 02/21/18 0014    Charlynne Pander, MD 02/22/18 [phone]

## 2018-02-20 NOTE — Discharge Instructions (Signed)
There was an abnormality seen on her elbow x-ray.  This is why she is in a splint.  She needs further evaluation by the orthopedic doctor.  Call in the next 4 to 5 days to arrange for an appointment.  Elevate the arm to help with swelling.  The splint cannot get wet.  You can take 1000 mg of Tylenol.  Do not exceed 4000 mg of Tylenol a day.  Patient had a small wound noted to her face.  This was repaired with tissue glue.  This will fall off on its own.  Please apply ice to her swelling and bruising.  Her facial bruising will continue to spread over the next few days.  This is normal.  Return the emergency department for any worsening pain of her arm, discoloration of her hands or fingers, swelling or redness of her hands or fingers, chest pain, difficulty breathing or any other worsening or concerning symptoms.

## 2018-02-20 NOTE — ED Notes (Signed)
[facility]

## 2018-02-20 NOTE — ED Notes (Signed)
Cleaned the blood out of patients hair. Will clean patients skin tear and lacerations when patients comes back from radiology.

## 2018-02-20 NOTE — ED Notes (Signed)
Bed: WA17 Expected date:  Expected time:  Means of arrival:  Comments: EMS 

## 2018-02-20 NOTE — ED Notes (Signed)
Pt can not sign for discharge due to mental status at this time.

## 2018-02-20 NOTE — ED Triage Notes (Addendum)
[facility] Patient was getting off the bus and tripped on the curb and landed on her knees and face. No bleeding or signs of injury on her knees. Patient has multiple skin tears on left arm. Laceration in her right eye brow near her nose. Bleeding is controlled. Blood on left side of head that appears to be from blood that ran down the side of her head.Patient denies blood thinners. Hx. CHF, CKD, Alzheimer.

## 2018-02-20 NOTE — ED Notes (Signed)
Patient transported to CT 

## 2018-02-20 NOTE — ED Notes (Addendum)
Patient in radiology. Will get EKG, blood work, and orthostatics when patient is back in room.

## 2018-02-26 ENCOUNTER — Other Ambulatory Visit: Payer: Self-pay

## 2018-02-26 ENCOUNTER — Emergency Department (HOSPITAL_COMMUNITY)
Admission: EM | Admit: 2018-02-26 | Discharge: 2018-02-27 | Disposition: A | Discharge status: Home / Self Care | Payer: Medicare Other | Attending: Emergency Medicine | Admitting: Emergency Medicine

## 2018-02-26 ENCOUNTER — Encounter (HOSPITAL_COMMUNITY): Payer: Self-pay

## 2018-02-26 DIAGNOSIS — S22000A Wedge compression fracture of unspecified thoracic vertebra, initial encounter for closed fracture: Secondary | ICD-10-CM

## 2018-02-26 DIAGNOSIS — Y9389 Activity, other specified: Secondary | ICD-10-CM | POA: Insufficient documentation

## 2018-02-26 DIAGNOSIS — Y998 Other external cause status: Secondary | ICD-10-CM | POA: Diagnosis not present

## 2018-02-26 DIAGNOSIS — S22080A Wedge compression fracture of T11-T12 vertebra, initial encounter for closed fracture: Secondary | ICD-10-CM | POA: Insufficient documentation

## 2018-02-26 DIAGNOSIS — S76011A Strain of muscle, fascia and tendon of right hip, initial encounter: Secondary | ICD-10-CM | POA: Diagnosis not present

## 2018-02-26 DIAGNOSIS — I110 Hypertensive heart disease with heart failure: Secondary | ICD-10-CM | POA: Diagnosis not present

## 2018-02-26 DIAGNOSIS — S79911A Unspecified injury of right hip, initial encounter: Secondary | ICD-10-CM | POA: Diagnosis present

## 2018-02-26 DIAGNOSIS — W010XXA Fall on same level from slipping, tripping and stumbling without subsequent striking against object, initial encounter: Secondary | ICD-10-CM | POA: Insufficient documentation

## 2018-02-26 DIAGNOSIS — Y92129 Unspecified place in nursing home as the place of occurrence of the external cause: Secondary | ICD-10-CM | POA: Insufficient documentation

## 2018-02-26 DIAGNOSIS — F0390 Unspecified dementia without behavioral disturbance: Secondary | ICD-10-CM | POA: Insufficient documentation

## 2018-02-26 DIAGNOSIS — I509 Heart failure, unspecified: Secondary | ICD-10-CM | POA: Insufficient documentation

## 2018-02-26 DIAGNOSIS — Z79899 Other long term (current) drug therapy: Secondary | ICD-10-CM | POA: Insufficient documentation

## 2018-02-26 NOTE — ED Triage Notes (Addendum)
Pt presents to ED due to pain in the right hip from [facility]. Pt experienced a fall and was not able to brace herself. Pt states to EMS she has not taken any pain medication since the fall. Per EMS, pt is A&Ox3, disoriented to time.

## 2018-02-26 NOTE — ED Notes (Signed)
Bed: ZO10WA18 Expected date:  Expected time:  Means of arrival:  Comments: EMS 84 yo fall 1 week ago 155/81

## 2018-02-27 ENCOUNTER — Emergency Department (HOSPITAL_COMMUNITY): Payer: Medicare Other

## 2018-02-27 LAB — CBC WITH DIFFERENTIAL/PLATELET
BASOS ABS: 0 10*3/uL (ref 0.0–0.1)
BASOS PCT: 0 %
Eosinophils Absolute: 0.3 10*3/uL (ref 0.0–0.7)
Eosinophils Relative: 3 %
HEMATOCRIT: 43.9 % (ref 36.0–46.0)
Hemoglobin: 14.3 g/dL (ref 12.0–15.0)
LYMPHS PCT: 13 %
Lymphs Abs: 1.4 10*3/uL (ref 0.7–4.0)
MCH: 32.1 pg (ref 26.0–34.0)
MCHC: 32.6 g/dL (ref 30.0–36.0)
MCV: 98.7 fL (ref 78.0–100.0)
Monocytes Absolute: 1.4 10*3/uL — ABNORMAL HIGH (ref 0.1–1.0)
Monocytes Relative: 13 %
NEUTROS ABS: 7.5 10*3/uL (ref 1.7–7.7)
NEUTROS PCT: 71 %
Platelets: 216 10*3/uL (ref 150–400)
RBC: 4.45 MIL/uL (ref 3.87–5.11)
RDW: 13.6 % (ref 11.5–15.5)
WBC: 10.7 10*3/uL — AB (ref 4.0–10.5)

## 2018-02-27 LAB — BASIC METABOLIC PANEL
ANION GAP: 12 (ref 5–15)
BUN: 68 mg/dL — ABNORMAL HIGH (ref 8–23)
CALCIUM: 9.2 mg/dL (ref 8.9–10.3)
CO2: 31 mmol/L (ref 22–32)
Chloride: 99 mmol/L (ref 98–111)
Creatinine, Ser: 2.12 mg/dL — ABNORMAL HIGH (ref 0.44–1.00)
GFR, EST AFRICAN AMERICAN: 22 mL/min — AB (ref >60)
GFR, EST NON AFRICAN AMERICAN: 19 mL/min — AB (ref >60)
Glucose, Bld: 107 mg/dL — ABNORMAL HIGH (ref 70–99)
POTASSIUM: 4 mmol/L (ref 3.5–5.1)
Sodium: 142 mmol/L (ref 135–145)

## 2018-02-27 LAB — PROTIME-INR
INR: 1.24
Prothrombin Time: 15.5 seconds — ABNORMAL HIGH (ref 11.4–15.2)

## 2018-02-27 MED ORDER — FENTANYL CITRATE (PF) 100 MCG/2ML IJ SOLN
50.0000 ug | INTRAMUSCULAR | Status: DC | PRN
  Administered 2018-02-27: 50 ug via INTRAVENOUS
  Filled 2018-02-27: qty 2

## 2018-02-27 MED ORDER — TRIPLE ANTIBIOTIC 3.5-400-5000 EX OINT
TOPICAL_OINTMENT | Freq: Every day | CUTANEOUS | Status: DC
  Administered 2018-02-27: 05:00:00 via TOPICAL
  Filled 2018-02-27: qty 1

## 2018-02-27 NOTE — ED Notes (Signed)
Attempted to ambulate in hall. Pt was able to ambulate to doorway until feeling like she was unable to stand. Nursing staff assisted pt to steady to return pt back to bed.

## 2018-02-27 NOTE — ED Notes (Signed)
PTAR called for transportation back to [facility].  

## 2018-02-27 NOTE — ED Provider Notes (Signed)
[facility]-EMERGENCY DEPT Provider Note   CSN: [id number] Arrival date & time: 02/26/18  2304     History   Chief Complaint Chief Complaint  Patient presents with  . Fall 1 week ago  . Right hip pain  Level 5 caveat due to dementia  HPI Jennifer Bender is a 84 y.o. female.  The history is provided by the patient. The history is limited by the condition of the patient.  Hip Pain  This is a new problem. Episode onset: Unknown. The problem occurs constantly. The problem has been gradually worsening. Nothing aggravates the symptoms. Nothing relieves the symptoms.  Presents from nursing facility for right hip pain.  Patient reports pain is been worsening, but she also reports pain throughout her body.  She reports chest pain, back pain, head pain, abdominal pain.  History is limited from patient due to dementia.  I spoke to the nurse facility([facility]) and they report patient's been having increasing right hip pain.  She is a full code. She was seen in the emergency department on July 3 for a fall, and per report there has not been any new falls since that time    Past Medical History:  Diagnosis Date  . Arthritis   . Atrial fibrillation (HCC)   . CHF (congestive heart failure) (HCC)   . Hypertension   . Pneumonia   . UTI (lower urinary tract infection)     There are no active problems to display for this patient.   Past Surgical History:  Procedure Laterality Date  . ABDOMINAL HYSTERECTOMY    . BREAST REDUCTION SURGERY    . CARDIOVERSION    . HAND SURGERY       OB History   None      Home Medications    Prior to Admission medications   Medication Sig Start Date End Date Taking? Authorizing Provider  diltiazem (DILACOR XR) 240 MG 24 hr capsule Take 240 mg by mouth daily.   Yes [provider]  donepezil (ARICEPT) 10 MG tablet Take 10 mg by mouth daily.   Yes [provider]  DULoxetine (CYMBALTA) 30 MG capsule  Take 30 mg by mouth daily.   Yes [provider]  fish oil-omega-3 fatty acids 1000 MG capsule Take 1 g by mouth daily.    Yes [provider]  levothyroxine (SYNTHROID, LEVOTHROID) 88 MCG tablet Take 88 mcg by mouth daily before breakfast.   Yes [provider]  Melatonin 5 MG TABS Take 5 mg by mouth at bedtime as needed (sleep).   Yes [provider]  Multiple Vitamin (MULITIVITAMIN WITH MINERALS) TABS Take 1 tablet by mouth daily.     Yes [provider]  potassium chloride (K-DUR,KLOR-CON) 10 MEQ tablet Take 10 mEq by mouth daily.   Yes [provider]  torsemide (DEMADEX) 20 MG tablet Take 40 mg by mouth 2 (two) times daily.    Yes [provider]    Family History No family history on file.  Social History Social History   Tobacco Use  . Smoking status: Never Smoker  . Smokeless tobacco: Never Used  Substance Use Topics  . Alcohol use: No  . Drug use: No     Allergies   Ace inhibitors; Colchicine; Crestor [rosuvastatin calcium]; Hydrocodone-acetaminophen; Lipitor [atorvastatin calcium]; Morphine and related; Nsaids; Pravachol; Rosuvastatin; Tramadol; and Zocor [simvastatin]   Review of Systems Review of Systems  Unable to perform ROS: Dementia     Physical Exam  Updated Vital Signs BP (!) 177/80 (BP Location: Right Arm)   Temp (!) 97.4 F (36.3 C) (Oral)   Resp 20   Ht 1.651 m (5\' 5" )   Wt 45.8 kg (101 lb)   SpO2 97%   BMI 16.81 kg/m   Physical Exam CONSTITUTIONAL: Elderly and frail HEAD: Healing wound to forehead, no other new signs of trauma EYES: EOMI/PERRL ENMT: Bruising noted throughout face from previous fall NECK: supple no meningeal signs CV: S1/S2 noted LUNGS: Lungs are clear to auscultation bilaterally, no apparent distress Chest-no bruising or crepitus ABDOMEN: soft, nontender GU:no cva tenderness NEURO: Pt is awake/alert, moves all extremities x4.  Patient is pleasantly  confused EXTREMITIES: pulses normal/equal, Right lower extremity is shortened.  She has significant tenderness with range of motion of her R hip.  She has various stages of bruising throughout extremities, but no other deformities.  Bandage is noted to left arm with tenderness of left elbow  SKIN: warm, color normal PSYCH: no abnormalities of mood noted, alert and oriented to situation   ED Treatments / Results  Labs (all labs ordered are listed, but only abnormal results are displayed) Labs Reviewed  BASIC METABOLIC PANEL - Abnormal; Notable for the following components:      Result Value   Glucose, Bld 107 (*)    BUN 68 (*)    Creatinine, Ser 2.12 (*)    GFR calc non Af Amer 19 (*)    GFR calc Af Amer 22 (*)    All other components within normal limits  CBC WITH DIFFERENTIAL/PLATELET - Abnormal; Notable for the following components:   WBC 10.7 (*)    Monocytes Absolute 1.4 (*)    All other components within normal limits  PROTIME-INR - Abnormal; Notable for the following components:   Prothrombin Time 15.5 (*)    All other components within normal limits  TYPE AND SCREEN    EKG EKG Interpretation  Date/Time:  Wednesday February 27 2018 01:05:29 EDT Ventricular Rate:  99 PR Interval:    QRS Duration: 155 QT Interval:  388 QTC Calculation: 498 R Axis:   -88 Text Interpretation:  Atrial fibrillation RBBB and LAFB Confirmed by Zadie Rhine ([id number]) on 02/27/2018 1:22:38 AM   Radiology Ct Lumbar Spine Wo Contrast  Result Date: 02/27/2018 CLINICAL DATA:  Fall.  RIGHT hip pain. EXAM: CT LUMBAR SPINE WITHOUT CONTRAST TECHNIQUE: Multidetector CT imaging of the lumbar spine was performed without intravenous contrast administration. Multiplanar CT image reconstructions were also generated. COMPARISON:  None. FINDINGS: SEGMENTATION: For the purposes of this report the last well-formed intervertebral disc space is reported as L5-S1. ALIGNMENT: Maintained lumbar lordosis. Grade 1 L5-S1  anterolisthesis without spondylolysis. VERTEBRAE: Acute moderate T12 compression fracture with 30-50% height loss. Slight buckling of the posterior cortex without retropulsion. Old mild T11 compression fracture with superior endplate Schmorl's node. Lumbar vertebral bodies intact. Intervertebral disc heights preserved. Osteopenia without destructive bony lesions. Subacute to chronic RIGHT posterior eleventh and twelfth rib fractures. Old RIGHT L3 transverse process fracture. PARASPINAL AND OTHER SOFT TISSUES: Small RIGHT pleural effusion. LEFT lung base atelectasis/scarring. Calcific atherosclerosis of the aortoiliac vessels. RIGHT pelvic kidney. DISC LEVELS: T12-L1 through L3-4: No disc bulge, canal stenosis nor neural foraminal narrowing. L4-5: No disc bulge. Moderate to severe facet arthropathy and ligamentum flavum redundancy without canal stenosis. Mild RIGHT, severe LEFT neural foraminal narrowing. L5-S1: Anterolisthesis. Small broad-based disc bulge. Moderate to severe facet arthropathy. No canal stenosis. Moderate bilateral neural foraminal narrowing. IMPRESSION: 1. Acute moderate T12  compression fracture. Old mild T11 compression fracture. 2. Grade 1 L5-S1 anterolisthesis without spondylolysis. 3. No canal stenosis. Neural foraminal narrowing L4-5 and L5-S1: Severe on the LEFT at L4-5. Electronically Signed   By: Awilda Metro M.D.   On: 02/27/2018 03:58   Ct Hip Right Wo Contrast  Result Date: 02/27/2018 CLINICAL DATA:  84 year old post fall with right hip pain. Hip pain, acute, fx suspected, initial exam EXAM: CT OF THE RIGHT HIP WITHOUT CONTRAST TECHNIQUE: Multidetector CT imaging of the right hip was performed according to the standard protocol. Multiplanar CT image reconstructions were also generated. Performed in conjunction with CT of the lumbar spine. COMPARISON:  Radiograph earlier this day. FINDINGS: Bones/Joint/Cartilage No acute fracture of the hip. Remote right inferior pubic ramus  fracture. Well-defined osseous density adjacent to the lesser trochanter in the region of the iliacus muscle likely sequela of remote injury. No significant hip joint space narrowing. Degenerative change at the pubic symphysis. No joint effusion. Ligaments Suboptimally assessed by CT. Muscles and Tendons No intramuscular hematoma. Soft tissues Suspect patchy contusion in the subcutaneous tissues laterally. No confluent hematoma. Advanced vascular calcifications. Large stool burden in the included pelvis with rectal distention. IMPRESSION: 1. No acute fracture of the right hip. 2. Suspected mild patchy subcutaneous contusion laterally. Electronically Signed   By: Rubye Oaks M.D.   On: 02/27/2018 03:56   Dg Hip Unilat With Pelvis 2-3 Views Right  Result Date: 02/27/2018 CLINICAL DATA:  Right hip pain after fall. EXAM: DG HIP (WITH OR WITHOUT PELVIS) 2-3V RIGHT COMPARISON:  Pelvis radiograph 02/20/2018, hip radiograph 02/21/2016 FINDINGS: The cortical margins of the bony pelvis and right hip are intact. No fracture. Diffuse bony under mineralization pubic symphysis and sacroiliac joints are congruent. Both femoral heads are well-seated in the respective acetabula. Lateral plate in the mid distal femoral shaft is partially included. Advanced vascular calcifications. IMPRESSION: No pelvic or right hip fracture. Electronically Signed   By: Rubye Oaks M.D.   On: 02/27/2018 00:36    Procedures Procedures (including critical care time)  Medications Ordered in ED Medications  fentaNYL (SUBLIMAZE) injection 50 mcg (has no administration in time range)     Initial Impression / Assessment and Plan / ED Course  I have reviewed the triage vital signs and the nursing notes.  Pertinent labs & imaging results that were available during my care of the patient were reviewed by me and considered in my medical decision making (see chart for details).     12:50 AM Patient seen on July 3 for fall.  She  had extensive imaging at that time, was found to have questionable left radial head fracture and discharged.  She now presents with right hip pain.  Per report, there is not been any new falls.  Imaging/labs pending Family - son Jake Shark - [phone] 4:49 AM Extensive  imaging performed.  CT hip shows no acute fracture.  I did perform lumbar imaging, which revealed compression fractures but no other acute findings She was able to bear weight and after a few steps reported pain.  I do not feel further imaging is required.  My suspicion for occult hip fracture is low at this time.  I was able to range the hip while she was lying in the bed without any pain elicited  Discharge back to facility.  Advised use of sling for her radial head fracture from previous injury.  No other acute traumatic injury found Final Clinical Impressions(s) / ED Diagnoses   Final  diagnoses:  Hip strain, right, initial encounter  Closed compression fracture of thoracic vertebra, initial encounter [facility])    ED Discharge Orders    None       Zadie Rhine, MD 02/27/18 [phone]

## 2018-02-27 NOTE — ED Notes (Signed)
Report given to [facility] 

## 2018-02-27 NOTE — ED Notes (Signed)
Wound care applied to pt's left forearm. Pt has an abrasion from her fall last week that did not heel due to arm being wrapped and having a splint. Triple antibiotic ointment has been applied and arm has been wrapped.

## 2018-03-03 ENCOUNTER — Emergency Department (HOSPITAL_COMMUNITY)
Admission: EM | Admit: 2018-03-03 | Discharge: 2018-03-03 | Disposition: A | Discharge status: Skilled Nursing Facility | Payer: Medicare Other | Attending: Emergency Medicine | Admitting: Emergency Medicine

## 2018-03-03 ENCOUNTER — Emergency Department (HOSPITAL_COMMUNITY): Payer: Medicare Other

## 2018-03-03 ENCOUNTER — Other Ambulatory Visit: Payer: Self-pay

## 2018-03-03 ENCOUNTER — Encounter (HOSPITAL_COMMUNITY): Payer: Self-pay | Admitting: Emergency Medicine

## 2018-03-03 DIAGNOSIS — Z79899 Other long term (current) drug therapy: Secondary | ICD-10-CM | POA: Diagnosis not present

## 2018-03-03 DIAGNOSIS — Y92129 Unspecified place in nursing home as the place of occurrence of the external cause: Secondary | ICD-10-CM | POA: Insufficient documentation

## 2018-03-03 DIAGNOSIS — Y9301 Activity, walking, marching and hiking: Secondary | ICD-10-CM | POA: Diagnosis not present

## 2018-03-03 DIAGNOSIS — I110 Hypertensive heart disease with heart failure: Secondary | ICD-10-CM | POA: Diagnosis not present

## 2018-03-03 DIAGNOSIS — S51011A Laceration without foreign body of right elbow, initial encounter: Secondary | ICD-10-CM | POA: Diagnosis not present

## 2018-03-03 DIAGNOSIS — I509 Heart failure, unspecified: Secondary | ICD-10-CM | POA: Insufficient documentation

## 2018-03-03 DIAGNOSIS — Y999 Unspecified external cause status: Secondary | ICD-10-CM | POA: Insufficient documentation

## 2018-03-03 DIAGNOSIS — W19XXXA Unspecified fall, initial encounter: Secondary | ICD-10-CM | POA: Diagnosis not present

## 2018-03-03 DIAGNOSIS — S59901A Unspecified injury of right elbow, initial encounter: Secondary | ICD-10-CM | POA: Diagnosis present

## 2018-03-03 MED ORDER — ACETAMINOPHEN 325 MG PO TABS
650.0000 mg | ORAL_TABLET | Freq: Once | ORAL | Status: AC
  Administered 2018-03-03: 650 mg via ORAL
  Filled 2018-03-03: qty 2

## 2018-03-03 NOTE — ED Notes (Signed)
Bed: ZO10WA22 Expected date:  Expected time:  Means of arrival:  Comments: 84 yo F/fall

## 2018-03-03 NOTE — ED Provider Notes (Signed)
[facility]-EMERGENCY DEPT Provider Note: Lowella DellJ. Lane Karema Tocci, MD, FACEP  CSN: [id number] MRN: [id number] ARRIVAL: 03/03/18 at 0514 ROOM: WA22/WA22   CHIEF COMPLAINT  Fall  Level 5 caveat: Dementia HISTORY OF PRESENT ILLNESS  03/03/18 5:21 AM Jennifer Bender is a 84 y.o. female who was sent from her living facility after an unwitnessed fall.  She was found on the floor.  She is complaining of neck pain.  She denies head pain or other injury apart from a skin tear to her right elbow.  She has healing facial ecchymoses from a previous fall as well as a healing skin tear to the left elbow from the previous fall.  She is awake and alert to her baseline.     Past Medical History:  Diagnosis Date  . Arthritis   . Atrial fibrillation (HCC)   . CHF (congestive heart failure) (HCC)   . Hypertension   . Pneumonia   . UTI (lower urinary tract infection)     Past Surgical History:  Procedure Laterality Date  . ABDOMINAL HYSTERECTOMY    . BREAST REDUCTION SURGERY    . CARDIOVERSION    . HAND SURGERY      No family history on file.  Social History   Tobacco Use  . Smoking status: Never Smoker  . Smokeless tobacco: Never Used  Substance Use Topics  . Alcohol use: No  . Drug use: No    Prior to Admission medications   Medication Sig Start Date End Date Taking? Authorizing Provider  diltiazem (DILACOR XR) 240 MG 24 hr capsule Take 240 mg by mouth daily.    [provider]  donepezil (ARICEPT) 10 MG tablet Take 10 mg by mouth daily.    [provider]  DULoxetine (CYMBALTA) 30 MG capsule Take 30 mg by mouth daily.    [provider]  fish oil-omega-3 fatty acids 1000 MG capsule Take 1 g by mouth daily.     [provider]  levothyroxine (SYNTHROID, LEVOTHROID) 88 MCG tablet Take 88 mcg by mouth daily before breakfast.    [provider]  Melatonin 5 MG TABS Take 5 mg by mouth at bedtime as needed (sleep).    [provider]  Multiple  Vitamin (MULITIVITAMIN WITH MINERALS) TABS Take 1 tablet by mouth daily.      [provider]  potassium chloride (K-DUR,KLOR-CON) 10 MEQ tablet Take 10 mEq by mouth daily.    [provider]  torsemide (DEMADEX) 20 MG tablet Take 40 mg by mouth 2 (two) times daily.     [provider]    Allergies Ace inhibitors; Colchicine; Crestor [rosuvastatin calcium]; Hydrocodone-acetaminophen; Lipitor [atorvastatin calcium]; Morphine and related; Nsaids; Pravachol; Rosuvastatin; Tramadol; and Zocor [simvastatin]   REVIEW OF SYSTEMS     PHYSICAL EXAMINATION  Initial Vital Signs Blood pressure 125/71, pulse 84, temperature 98.1 F (36.7 C), temperature source Oral, resp. rate 18, SpO2 99 %.  Examination General: Well-developed, well-nourished female in no acute distress; appearance consistent with age of record HENT: normocephalic; no scalp hematomas seen or palpated; resolving ecchymoses of the maxillas bilaterally Eyes: Right pupil round and reactive to light, left pupil irregular; extraocular muscles grossly intact Neck: Immobilized in rolled towel; posterior tenderness Heart: regular rate and rhythm Lungs: clear to auscultation bilaterally Abdomen: soft; nondistended; nontender; bowel sounds present Extremities: No deformity; full range of motion; pulses normal; trace edema of lower legs; no pain on passive range of motion Neurologic: Awake, alert and oriented x 2, disoriented to  day, date, year and POTUS; motor function intact in all extremities and symmetric; no facial droop Skin: Warm and dry; chronic appearing changes of lower legs; acute skin tear of the right elbow; subacute, healing skin tear of the left elbow Psychiatric: Normal mood and affect   RESULTS  Summary of this visit's results, reviewed by myself:   EKG Interpretation  Date/Time:    Ventricular Rate:    PR Interval:    QRS Duration:   QT Interval:    QTC Calculation:   R Axis:     Text  Interpretation:        Laboratory Studies: No results found for this or any previous visit (from the past 24 hour(s)). Imaging Studies: Ct Cervical Spine Wo Contrast  Result Date: 03/03/2018 CLINICAL DATA:  84 year old female with history of unwitnessed fall. Dementia. EXAM: CT CERVICAL SPINE WITHOUT CONTRAST TECHNIQUE: Multidetector CT imaging of the cervical spine was performed without intravenous contrast. Multiplanar CT image reconstructions were also generated. COMPARISON:  Cervical spine CT scan 02/20/2018. FINDINGS: Alignment: Normal. Skull base and vertebrae: Chronic compression fractures of C5 and T2 are again noted, most severe at T1 where there is 40% loss of anterior vertebral body height. No other definite acute displaced fractures are identified on today's examination. Soft tissues and spinal canal: No prevertebral fluid or swelling. No visible canal hematoma. Disc levels: Multilevel degenerative disc disease, most severe at C5-C6 and C6-C7. Mild multilevel facet arthropathy. Upper chest: Moderate right and small left pleural effusions are incompletely imaged. Other: None. IMPRESSION: 1. No evidence of significant acute traumatic injury to the cervical spine. 2. Chronic compression fractures of C5 and T2 are unchanged. Multilevel degenerative disc disease and cervical spondylosis, similar to the prior study. 3. Moderate right and small left pleural effusions incompletely imaged. Electronically Signed   By: Trudie Reed M.D.   On: 03/03/2018 07:11    ED COURSE and MDM  Nursing notes and initial vitals signs, including pulse oximetry, reviewed.  Vitals:   03/03/18 0514  BP: 125/71  Pulse: 84  Resp: 18  Temp: 98.1 F (36.7 C)  TempSrc: Oral  SpO2: 99%    PROCEDURES    ED DIAGNOSES     ICD-10-CM   1. Fall at nursing home, initial encounter W19.XXXA    Y92.129   2. Skin tear of right elbow without complication, initial encounter S51.011A        Kris No, Jonny Ruiz,  MD 03/03/18 [phone]

## 2018-03-03 NOTE — ED Triage Notes (Signed)
[facility] BIB [facility] unwitnessed fall. Patient was found on the floor. Patient states she was trying to buy a refrigerator or a stove. Patient pleasant on arrival in NAD.

## 2018-03-03 NOTE — ED Notes (Signed)
[facility] notified of [facility].

## 2018-03-07 ENCOUNTER — Encounter (HOSPITAL_COMMUNITY): Payer: Self-pay

## 2018-03-07 ENCOUNTER — Emergency Department (HOSPITAL_COMMUNITY)
Admission: EM | Admit: 2018-03-07 | Discharge: 2018-03-07 | Disposition: A | Discharge status: Home / Self Care | Payer: Medicare Other | Attending: Emergency Medicine | Admitting: Emergency Medicine

## 2018-03-07 DIAGNOSIS — I110 Hypertensive heart disease with heart failure: Secondary | ICD-10-CM | POA: Diagnosis not present

## 2018-03-07 DIAGNOSIS — S50311A Abrasion of right elbow, initial encounter: Secondary | ICD-10-CM | POA: Insufficient documentation

## 2018-03-07 DIAGNOSIS — I509 Heart failure, unspecified: Secondary | ICD-10-CM | POA: Diagnosis not present

## 2018-03-07 DIAGNOSIS — S098XXA Other specified injuries of head, initial encounter: Secondary | ICD-10-CM | POA: Diagnosis present

## 2018-03-07 DIAGNOSIS — S0001XA Abrasion of scalp, initial encounter: Secondary | ICD-10-CM

## 2018-03-07 DIAGNOSIS — S0003XA Contusion of scalp, initial encounter: Secondary | ICD-10-CM | POA: Insufficient documentation

## 2018-03-07 DIAGNOSIS — Y92121 Bathroom in nursing home as the place of occurrence of the external cause: Secondary | ICD-10-CM | POA: Diagnosis not present

## 2018-03-07 DIAGNOSIS — Y9389 Activity, other specified: Secondary | ICD-10-CM | POA: Insufficient documentation

## 2018-03-07 DIAGNOSIS — Z79899 Other long term (current) drug therapy: Secondary | ICD-10-CM | POA: Insufficient documentation

## 2018-03-07 DIAGNOSIS — Y999 Unspecified external cause status: Secondary | ICD-10-CM | POA: Diagnosis not present

## 2018-03-07 DIAGNOSIS — W01198A Fall on same level from slipping, tripping and stumbling with subsequent striking against other object, initial encounter: Secondary | ICD-10-CM | POA: Insufficient documentation

## 2018-03-07 DIAGNOSIS — W19XXXA Unspecified fall, initial encounter: Secondary | ICD-10-CM

## 2018-03-07 MED ORDER — ACETAMINOPHEN 325 MG PO TABS
650.0000 mg | ORAL_TABLET | Freq: Once | ORAL | Status: AC
  Administered 2018-03-07: 650 mg via ORAL
  Filled 2018-03-07: qty 2

## 2018-03-07 NOTE — Discharge Instructions (Addendum)
Based on the low-impact mechanism of your fall and your physical exam, we do not suspect any fractures or bleeds due to your fall. Please return to the ED if you have worsening pain anywhere, severe headache, nausea, or confusion. Keep your wounds clean and dry with bandages. Intermittently apply ice to your head wound and right elbow wound. Strict fall precautions at nursing homes to prevent future falls. Consider bedside commode.

## 2018-03-07 NOTE — ED Notes (Signed)
Patient given discharge instructions and verbalized understanding.  Patient stable to discharge at this time.  Patient is alert and oriented to baseline.  No distressed noted at this time.  All belongings taken with the patient at discharge.   

## 2018-03-07 NOTE — ED Notes (Signed)
Pt has been placed on pure wick and hooked up to suction canister.

## 2018-03-07 NOTE — ED Notes (Signed)
PT[facility] contacted to transport patient to [facility] 

## 2018-03-07 NOTE — ED Triage Notes (Signed)
Pt brought in by EMS due to having a fall while trying to use the bathroom. Pt slipped on urine in bathroom. Pt denies LOC and neck/back pain. Pt c/o butt pain. Pt does have dementia. Pt a&ox3. Pt has small lac on back on head and skin tear on arm.

## 2018-03-07 NOTE — ED Provider Notes (Signed)
[facility] EMERGENCY DEPARTMENT Provider Note   CSN: [id number] Arrival date & time: 03/07/18  [id number]   History   Chief Complaint Chief Complaint  Patient presents with  . Fall    HPI Jennifer Bender is a 84 y.o. female with a medical history of dementia, HTN, CHF, and afib (not anticoagulated) who presents after a fall that occurred at her nursing facility earlier today. The patient reports that she needed to go to the bathroom and knew that she was supposed to wait for the staff to help her. However, they were taking too long so she began to walk to the bathroom by herself. Before reaching the toilet, she began to urinate and ultimately slipped on her urine. She reports hitting the back of her head on the water heater and falling onto her right side on the bathroom floor. She remembers the whole episode and denies LOC. She endorses mild pain in her posterior head, right elbow pain, right finger pain, and all-over body soreness. She denies changes in vision, nausea, vomiting, or confusion. She denies chest pain, dyspnea, dizziness, or palpitations before her fall. Of note, this is her 4th fall and visit to the ED this month.   Past Medical History:  Diagnosis Date  . Arthritis   . Atrial fibrillation (HCC)   . CHF (congestive heart failure) (HCC)   . Hypertension   . Pneumonia   . UTI (lower urinary tract infection)     There are no active problems to display for this patient.   Past Surgical History:  Procedure Laterality Date  . ABDOMINAL HYSTERECTOMY    . BREAST REDUCTION SURGERY    . CARDIOVERSION    . HAND SURGERY       OB History   None      Home Medications    Prior to Admission medications   Medication Sig Start Date End Date Taking? Authorizing Provider  diltiazem (DILACOR XR) 240 MG 24 hr capsule Take 240 mg by mouth daily.    [provider]  donepezil (ARICEPT) 10 MG tablet Take 10 mg by mouth daily.    [provider]  DULoxetine (CYMBALTA) 30 MG capsule Take 30 mg by mouth daily.    [provider]  fish oil-omega-3 fatty acids 1000 MG capsule Take 1 g by mouth daily.     [provider]  levothyroxine (SYNTHROID, LEVOTHROID) 88 MCG tablet Take 88 mcg by mouth daily before breakfast.    [provider]  Melatonin 5 MG TABS Take 5 mg by mouth at bedtime as needed (sleep).    [provider]  Multiple Vitamin (MULITIVITAMIN WITH MINERALS) TABS Take 1 tablet by mouth daily.      [provider]  potassium chloride (K-DUR,KLOR-CON) 10 MEQ tablet Take 10 mEq by mouth daily.    [provider]  torsemide (DEMADEX) 20 MG tablet Take 40 mg by mouth 2 (two) times daily.     [provider]    Family History No family history on file.  Social History Social History   Tobacco Use  . Smoking status: Never Smoker  . Smokeless tobacco: Never Used  Substance Use Topics  . Alcohol use: No  . Drug use: No     Allergies   Ace inhibitors; Colchicine; Crestor [rosuvastatin calcium]; Hydrocodone-acetaminophen; Lipitor [atorvastatin calcium]; Morphine and related; Nsaids; Pravachol; Rosuvastatin; Tramadol; and Zocor [simvastatin]   Review of Systems Review of Systems  Constitutional: Negative for chills and fever.  HENT: Negative for rhinorrhea and sore throat.   Eyes: Negative for visual disturbance.  Respiratory: Negative for cough and shortness of breath.   Cardiovascular: Negative for chest pain and leg swelling.  Gastrointestinal: Negative for abdominal pain, nausea and vomiting.  Genitourinary: Negative for difficulty urinating and dysuria.  Musculoskeletal:       Right elbow pain. Right ring finger pain.  Skin: Negative for rash.       Skin abrasions and ecchymoses.  Neurological: Negative for dizziness and syncope.     Physical Exam Updated Vital Signs BP (!) 168/76   Pulse 76   Temp 97.9 F (36.6 C) (Oral)   Resp 17   Ht 5'  5" (1.651 m)   Wt 45.8 kg (101 lb)   SpO2 98%   BMI 16.81 kg/m   Physical Exam  Constitutional: She appears well-developed and well-nourished. No distress.  HENT:  Head: Normocephalic.  Right posterior scalp hematoma with overlying abrasion. Bilateral cheek ecchymoses that appear old.  Eyes: Pupils are equal, round, and reactive to light. EOM are normal.  Neck: Normal range of motion. Neck supple.  Cardiovascular: Normal rate. Exam reveals no gallop and no friction rub.  No murmur heard. Irregularly irregular.  Pulmonary/Chest: Effort normal and breath sounds normal. No respiratory distress. She has no wheezes. She has no rales.  Abdominal: Soft. Bowel sounds are normal. She exhibits no distension. There is no tenderness.  Musculoskeletal: Normal range of motion.  Multiple skin tears and abrasions to the right elbow. Right ring finger has no swelling or deformity. Normal ROM. Bilateral patellar ecchymoses.  Neurological: She is alert. No cranial nerve deficit or sensory deficit. She exhibits normal muscle tone.  Oriented to person and place, but not to time.  Skin: Skin is warm and dry. Capillary refill takes less than 2 seconds.  Psychiatric: She has a normal mood and affect. Her behavior is normal.     ED Treatments / Results  Labs (all labs ordered are listed, but only abnormal results are displayed) Labs Reviewed - No data to display  EKG EKG Interpretation  Date/Time:  Thursday March 07 2018 07:51:32 EDT Ventricular Rate:  74 PR Interval:    QRS Duration: 163 QT Interval:  420 QTC Calculation: 450 R Axis:   -164 Text Interpretation:  Atrial fibrillation Ventricular premature complex Right bundle branch block Confirmed by Cathren Laine ([id number]) on 03/07/2018 8:02:38 AM   Radiology No results found.  Procedures Procedures (including critical care time)  Medications Ordered in ED Medications  acetaminophen (TYLENOL) tablet 650 mg (650 mg Oral Given 03/07/18 0955)      Initial Impression / Assessment and Plan / ED Course  I have reviewed the triage vital signs and the nursing notes.  Pertinent labs & imaging results that were available during my care of the patient were reviewed by me and considered in my medical decision making (see chart for details).  Jennifer Bender is a 84 y.o. female with a medical history of dementia, HTN, CHF, and afib (not anticoagulated) who presents after a mechanical fall resulting in multiple abrasions, particularly to her right posterior scalp and right elbow. Upon arrival to the ED, she was afebrile and hemodynamically stable with pain in the areas of her wounds and overall soreness, but no overt headache, bony pain, altered mental status, or neurological deficits. This is the patient's 4th visit this month for falls and she has had multiple scans during this time which have showed lumbar vertebral compression fractures, but  no acute injuries or bleeds. Based on her benign symptoms and physical exam today, no imaging is necessary. She has no lacerations in need of repair. Wounds were cleaned and dressed. Pt medically safe for discharge back to nursing facility with strict fall precautions and strict return precautions of headache, vomiting, chest pain, or any other new or concerning symptoms. Advised patient to treat pain with tylenol and to intermittently ice her wounds. Advised patient to follow the fall precautions laid out by her nursing facility. Recommended a bedside commode.  Final Clinical Impressions(s) / ED Diagnoses   Final diagnoses:  Fall, initial encounter  Hematoma of scalp, initial encounter  Abrasion of scalp, initial encounter  Abrasion of right elbow, initial encounter    ED Discharge Orders    None       Dorrell, Cathleen Corti, MD 03/07/18 1434    Cathren Laine, MD 03/07/18 1530

## 2018-03-12 ENCOUNTER — Other Ambulatory Visit: Payer: Self-pay

## 2018-03-12 ENCOUNTER — Encounter (HOSPITAL_COMMUNITY): Payer: Self-pay

## 2018-03-12 ENCOUNTER — Emergency Department (HOSPITAL_COMMUNITY): Payer: Medicare Other

## 2018-03-12 ENCOUNTER — Emergency Department (HOSPITAL_COMMUNITY)
Admission: EM | Admit: 2018-03-12 | Discharge: 2018-03-12 | Disposition: A | Discharge status: Home / Self Care | Payer: Medicare Other | Attending: Emergency Medicine | Admitting: Emergency Medicine

## 2018-03-12 DIAGNOSIS — I509 Heart failure, unspecified: Secondary | ICD-10-CM | POA: Diagnosis not present

## 2018-03-12 DIAGNOSIS — I110 Hypertensive heart disease with heart failure: Secondary | ICD-10-CM | POA: Insufficient documentation

## 2018-03-12 DIAGNOSIS — R0602 Shortness of breath: Secondary | ICD-10-CM | POA: Diagnosis not present

## 2018-03-12 DIAGNOSIS — Z79899 Other long term (current) drug therapy: Secondary | ICD-10-CM | POA: Insufficient documentation

## 2018-03-12 LAB — CBC
HCT: 38.6 % (ref 36.0–46.0)
Hemoglobin: 11.9 g/dL — ABNORMAL LOW (ref 12.0–15.0)
MCH: 31.2 pg (ref 26.0–34.0)
MCHC: 30.8 g/dL (ref 30.0–36.0)
MCV: 101 fL — ABNORMAL HIGH (ref 78.0–100.0)
PLATELETS: 293 10*3/uL (ref 150–400)
RBC: 3.82 MIL/uL — AB (ref 3.87–5.11)
RDW: 13.1 % (ref 11.5–15.5)
WBC: 8.2 10*3/uL (ref 4.0–10.5)

## 2018-03-12 LAB — BASIC METABOLIC PANEL
Anion gap: 13 (ref 5–15)
BUN: 64 mg/dL — AB (ref 8–23)
CALCIUM: 8.7 mg/dL — AB (ref 8.9–10.3)
CO2: 22 mmol/L (ref 22–32)
Chloride: 99 mmol/L (ref 98–111)
Creatinine, Ser: 2.35 mg/dL — ABNORMAL HIGH (ref 0.44–1.00)
GFR calc Af Amer: 20 mL/min — ABNORMAL LOW (ref >60)
GFR, EST NON AFRICAN AMERICAN: 17 mL/min — AB (ref >60)
Glucose, Bld: 141 mg/dL — ABNORMAL HIGH (ref 70–99)
POTASSIUM: 4.5 mmol/L (ref 3.5–5.1)
SODIUM: 134 mmol/L — AB (ref 135–145)

## 2018-03-12 LAB — BRAIN NATRIURETIC PEPTIDE: B NATRIURETIC PEPTIDE 5: 109.8 pg/mL — AB (ref 0.0–100.0)

## 2018-03-12 MED ORDER — ALBUTEROL SULFATE (2.5 MG/3ML) 0.083% IN NEBU: 5.0000 mg | INHALATION_SOLUTION | Freq: Once | RESPIRATORY_TRACT | Status: DC

## 2018-03-12 NOTE — Discharge Instructions (Addendum)
Continue medications as previously prescribed.  Return to the ER if symptoms significantly worsen or change. 

## 2018-03-12 NOTE — ED Provider Notes (Signed)
[facility] EMERGENCY DEPARTMENT Provider Note   CSN: [id number] Arrival date & time: 03/12/18  0154     History   Chief Complaint Chief Complaint  Patient presents with  . Shortness of Breath    HPI Jennifer Bender is a 84 y.o. female.  Patient is a 84 year old female with past medical history of atrial fibrillation, congestive heart failure, and dementia.  She was brought from her extended care facility for evaluation of shortness of breath and wheezing.  She received albuterol in route and is now feeling better.  The history is provided by the patient.  Shortness of Breath  This is a new problem. The problem occurs continuously.The current episode started yesterday. The problem has been gradually improving. Pertinent negatives include no fever, no cough, no sputum production and no chest pain. She has tried nothing for the symptoms. The treatment provided no relief.    Past Medical History:  Diagnosis Date  . Arthritis   . Atrial fibrillation (HCC)   . CHF (congestive heart failure) (HCC)   . Hypertension   . Pneumonia   . UTI (lower urinary tract infection)     There are no active problems to display for this patient.   Past Surgical History:  Procedure Laterality Date  . ABDOMINAL HYSTERECTOMY    . BREAST REDUCTION SURGERY    . CARDIOVERSION    . HAND SURGERY       OB History   None      Home Medications    Prior to Admission medications   Medication Sig Start Date End Date Taking? Authorizing Provider  diltiazem (DILACOR XR) 240 MG 24 hr capsule Take 240 mg by mouth daily.    [provider]  donepezil (ARICEPT) 10 MG tablet Take 10 mg by mouth daily.    [provider]  DULoxetine (CYMBALTA) 30 MG capsule Take 30 mg by mouth daily.    [provider]  fish oil-omega-3 fatty acids 1000 MG capsule Take 1 g by mouth daily.     [provider]  levothyroxine (SYNTHROID, LEVOTHROID) 88 MCG tablet  Take 88 mcg by mouth daily before breakfast.    [provider]  Melatonin 5 MG TABS Take 5 mg by mouth at bedtime as needed (sleep).    [provider]  Multiple Vitamin (MULITIVITAMIN WITH MINERALS) TABS Take 1 tablet by mouth daily.      [provider]  potassium chloride (K-DUR,KLOR-CON) 10 MEQ tablet Take 10 mEq by mouth daily.    [provider]  torsemide (DEMADEX) 20 MG tablet Take 40 mg by mouth 2 (two) times daily.     [provider]    Family History History reviewed. No pertinent family history.  Social History Social History   Tobacco Use  . Smoking status: Never Smoker  . Smokeless tobacco: Never Used  Substance Use Topics  . Alcohol use: No  . Drug use: No     Allergies   Ace inhibitors; Colchicine; Crestor [rosuvastatin calcium]; Hydrocodone-acetaminophen; Lipitor [atorvastatin calcium]; Morphine and related; Nsaids; Pravachol; Rosuvastatin; Tramadol; and Zocor [simvastatin]   Review of Systems Review of Systems  Constitutional: Negative for fever.  Respiratory: Positive for shortness of breath. Negative for cough and sputum production.   Cardiovascular: Negative for chest pain.  All other systems reviewed and are negative.    Physical Exam Updated Vital Signs BP (!) 129/52   Pulse (!) 109   Temp 97.6 F (36.4 C) (Tympanic)  Resp 16   Ht 5\' 5"  (1.651 m)   Wt 45.4 kg (100 lb)   SpO2 91%   BMI 16.64 kg/m   Physical Exam  Constitutional: She is oriented to person, place, and time. She appears well-developed and well-nourished. No distress.  HENT:  Head: Normocephalic and atraumatic.  Neck: Normal range of motion. Neck supple.  Cardiovascular: Normal rate and regular rhythm. Exam reveals no gallop and no friction rub.  No murmur heard. Pulmonary/Chest: Effort normal and breath sounds normal. No respiratory distress. She has no wheezes.  There are slight rhonchi bilaterally.  Abdominal: Soft. Bowel  sounds are normal. She exhibits no distension. There is no tenderness.  Musculoskeletal: Normal range of motion.  Neurological: She is alert and oriented to person, place, and time.  Skin: Skin is warm and dry. She is not diaphoretic.  Nursing note and vitals reviewed.    ED Treatments / Results  Labs (all labs ordered are listed, but only abnormal results are displayed) Labs Reviewed  CBC - Abnormal; Notable for the following components:      Result Value   RBC 3.82 (*)    Hemoglobin 11.9 (*)    MCV 101.0 (*)    All other components within normal limits  BASIC METABOLIC PANEL - Abnormal; Notable for the following components:   Sodium 134 (*)    Glucose, Bld 141 (*)    BUN 64 (*)    Creatinine, Ser 2.35 (*)    Calcium 8.7 (*)    GFR calc non Af Amer 17 (*)    GFR calc Af Amer 20 (*)    All other components within normal limits  BRAIN NATRIURETIC PEPTIDE    EKG None  Radiology Dg Chest 2 View  Result Date: 03/12/2018 CLINICAL DATA:  Chest pain today. History of CHF and pacemaker. Shortness of breath. EXAM: CHEST - 2 VIEW COMPARISON:  02/20/2018 FINDINGS: Cardiac pacemaker. Shallow inspiration. Cardiac enlargement with pulmonary vascular congestion. Small bilateral pleural effusions are developing since previous study. Basilar atelectasis. No definite edema or consolidation. No pneumothorax. Calcification of the aorta. Degenerative changes in the spine and shoulders. Old fracture deformity of the proximal right humerus. IMPRESSION: Cardiac enlargement with pulmonary vascular congestion and small bilateral pleural effusions. Electronically Signed   By: Burman NievesWilliam  Stevens M.D.   On: 03/12/2018 02:49    Procedures Procedures (including critical care time)  Medications Ordered in ED Medications - No data to display   Initial Impression / Assessment and Plan / ED Course  I have reviewed the triage vital signs and the nursing notes.  Pertinent labs & imaging results that were  available during my care of the patient were reviewed by me and considered in my medical decision making (see chart for details).  Patient given an albuterol treatment and steroids in route by EMS.  Her oxygen saturations are adequate and she is in no respiratory distress.  Her lungs are basically clear at this time.  Her work-up is showing no significant abnormality otherwise.  Chest x-ray shows pulmonary vascular congestion, however BNP is essentially normal.  At this point, I feel as though she is appropriate for discharge and follow-up as needed.  Final Clinical Impressions(s) / ED Diagnoses   Final diagnoses:  None    ED Discharge Orders    None       Geoffery Lyonselo, Faigy Stretch, MD 03/12/18 [phone]

## 2018-03-12 NOTE — ED Notes (Signed)
[facility] called  

## 2018-03-12 NOTE — ED Triage Notes (Signed)
[facility] breath and diffuse wheezing.  EMS treated with 15mg  albuterol, atrovent, and 125mg  solumedrol.  Patient able to talk in broken sentences.  Hx of Alzheimers, CHF and recent broken hip.

## 2018-03-22 ENCOUNTER — Emergency Department (HOSPITAL_COMMUNITY): Payer: Medicare Other

## 2018-03-22 ENCOUNTER — Emergency Department (HOSPITAL_COMMUNITY)
Admission: EM | Admit: 2018-03-22 | Discharge: 2018-03-22 | Disposition: A | Discharge status: Home / Self Care | Payer: Medicare Other | Attending: Emergency Medicine | Admitting: Emergency Medicine

## 2018-03-22 ENCOUNTER — Encounter (HOSPITAL_COMMUNITY): Payer: Self-pay | Admitting: Emergency Medicine

## 2018-03-22 DIAGNOSIS — S39012A Strain of muscle, fascia and tendon of lower back, initial encounter: Secondary | ICD-10-CM | POA: Diagnosis not present

## 2018-03-22 DIAGNOSIS — S7002XA Contusion of left hip, initial encounter: Secondary | ICD-10-CM | POA: Diagnosis not present

## 2018-03-22 DIAGNOSIS — W010XXA Fall on same level from slipping, tripping and stumbling without subsequent striking against object, initial encounter: Secondary | ICD-10-CM | POA: Insufficient documentation

## 2018-03-22 DIAGNOSIS — I110 Hypertensive heart disease with heart failure: Secondary | ICD-10-CM | POA: Insufficient documentation

## 2018-03-22 DIAGNOSIS — Y92129 Unspecified place in nursing home as the place of occurrence of the external cause: Secondary | ICD-10-CM | POA: Insufficient documentation

## 2018-03-22 DIAGNOSIS — I509 Heart failure, unspecified: Secondary | ICD-10-CM | POA: Diagnosis not present

## 2018-03-22 DIAGNOSIS — Y999 Unspecified external cause status: Secondary | ICD-10-CM | POA: Insufficient documentation

## 2018-03-22 DIAGNOSIS — G309 Alzheimer's disease, unspecified: Secondary | ICD-10-CM | POA: Diagnosis not present

## 2018-03-22 DIAGNOSIS — S29019A Strain of muscle and tendon of unspecified wall of thorax, initial encounter: Secondary | ICD-10-CM

## 2018-03-22 DIAGNOSIS — Z79899 Other long term (current) drug therapy: Secondary | ICD-10-CM | POA: Diagnosis not present

## 2018-03-22 DIAGNOSIS — W19XXXA Unspecified fall, initial encounter: Secondary | ICD-10-CM

## 2018-03-22 DIAGNOSIS — Y9301 Activity, walking, marching and hiking: Secondary | ICD-10-CM | POA: Diagnosis not present

## 2018-03-22 DIAGNOSIS — S79912A Unspecified injury of left hip, initial encounter: Secondary | ICD-10-CM | POA: Diagnosis present

## 2018-03-22 MED ORDER — ACETAMINOPHEN 325 MG PO TABS
650.0000 mg | ORAL_TABLET | Freq: Once | ORAL | Status: AC
  Administered 2018-03-22: 650 mg via ORAL
  Filled 2018-03-22: qty 2

## 2018-03-22 NOTE — ED Notes (Signed)
Reviewed discharge instructions with pt, who noted understanding. Also [place] and [place] [facility] [place] [facility] Pt departed in NAD, and in care of [facility].

## 2018-03-22 NOTE — ED Provider Notes (Signed)
[facility] EMERGENCY DEPARTMENT Provider Note   CSN: [id number] Arrival date & time: 03/22/18  0103     History   Chief Complaint Chief Complaint  Patient presents with  . Fall    HPI Jennifer Bender is a 84 y.o. female.  Patient is a 84 year old female brought from [facility] for evaluation of a fall.  She was apparently attempting to ambulate when she lost her balance and fell.  She was unable to get off the floor and believe she was on the floor for approximately 1 hour.  She is complaining of pain in her left hip and back.  Denies other injury.  History is somewhat limited secondary to dementia.  The history is provided by the patient.  Fall  This is a new problem. The current episode started 1 to 2 hours ago. The problem occurs constantly. The problem has not changed since onset.Exacerbated by: Movement and palpation. Nothing relieves the symptoms. She has tried nothing for the symptoms.    Past Medical History:  Diagnosis Date  . Arthritis   . Atrial fibrillation (HCC)   . CHF (congestive heart failure) (HCC)   . Hypertension   . Pneumonia   . UTI (lower urinary tract infection)     There are no active problems to display for this patient.   Past Surgical History:  Procedure Laterality Date  . ABDOMINAL HYSTERECTOMY    . BREAST REDUCTION SURGERY    . CARDIOVERSION    . HAND SURGERY       OB History   None      Home Medications    Prior to Admission medications   Medication Sig Start Date End Date Taking? Authorizing Provider  acetaminophen (TYLENOL) 325 MG tablet Take 650 mg by mouth every 6 (six) hours as needed for mild pain.    [provider]  diltiazem (DILACOR XR) 240 MG 24 hr capsule Take 240 mg by mouth daily.    [provider]  donepezil (ARICEPT) 10 MG tablet Take 10 mg by mouth daily.    [provider]  DULoxetine (CYMBALTA) 30 MG capsule Take 30 mg by mouth daily.    [provider]    fish oil-omega-3 fatty acids 1000 MG capsule Take 1 g by mouth daily.     [provider]  levothyroxine (SYNTHROID, LEVOTHROID) 88 MCG tablet Take 88 mcg by mouth daily before breakfast.    [provider]  Melatonin 5 MG TABS Take 5 mg by mouth at bedtime as needed (sleep).    [provider]  Menthol, Topical Analgesic, (ICE BLUE EX) Apply 1 application topically 3 (three) times daily.    [provider]  Multiple Vitamin (MULITIVITAMIN WITH MINERALS) TABS Take 1 tablet by mouth daily.      [provider]  nitrofurantoin, macrocrystal-monohydrate, (MACROBID) 100 MG capsule Take 100 mg by mouth 4 (four) times daily. For 5 days    [provider]  potassium chloride (K-DUR,KLOR-CON) 10 MEQ tablet Take 10 mEq by mouth daily.    [provider]  Skin Protectants, Misc. (EUCERIN) cream Apply 1 application topically at bedtime.    [provider]  torsemide (DEMADEX) 20 MG tablet Take 40 mg by mouth every morning.     [provider]    Family History History reviewed. No pertinent family history.  Social History Social History   Tobacco Use  . Smoking status: Never Smoker  . Smokeless tobacco: Never Used  Substance Use Topics  . Alcohol use: No  . Drug use: No     Allergies   Ace inhibitors; Colchicine; Crestor [rosuvastatin calcium]; Hydrocodone-acetaminophen; Lipitor [atorvastatin calcium]; Morphine and related; Nsaids; Pravachol; Rosuvastatin; Tramadol; and Zocor [simvastatin]   Review of Systems Review of Systems  All other systems reviewed and are negative.    Physical Exam Updated Vital Signs BP (!) 134/56   Pulse 64   Temp 97.8 F (36.6 C) (Oral)   Resp 12   SpO2 98%   Physical Exam  Constitutional: She is oriented to person, place, and time. She appears well-developed and well-nourished. No distress.  HENT:  Head: Normocephalic and atraumatic.  Neck: Normal range of motion. Neck  supple.  Cardiovascular: Normal rate and regular rhythm. Exam reveals no gallop and no friction rub.  No murmur heard. Pulmonary/Chest: Effort normal and breath sounds normal. No respiratory distress. She has no wheezes.  Abdominal: Soft. Bowel sounds are normal. She exhibits no distension. There is no tenderness.  Musculoskeletal: Normal range of motion.  There is tenderness to palpation in the soft tissues of the thoracic and lumbar region.  There is no bony tenderness or step-off.  The left hip appears grossly normal.  There is minimal pain with palpation and range of motion.  Distal PMS is intact.  Neurological: She is alert and oriented to person, place, and time.  Skin: Skin is warm and dry. She is not diaphoretic.  Nursing note and vitals reviewed.    ED Treatments / Results  Labs (all labs ordered are listed, but only abnormal results are displayed) Labs Reviewed - No data to display  EKG None  Radiology No results found.  Procedures Procedures (including critical care time)  Medications Ordered in ED Medications - No data to display   Initial Impression / Assessment and Plan / ED Course  I have reviewed the triage vital signs and the nursing notes.  Pertinent labs & imaging results that were available during my care of the patient were reviewed by me and considered in my medical decision making (see chart for details).  X-rays are negative for fracture.  Patient will be discharged, to follow-up as needed.  Final Clinical Impressions(s) / ED Diagnoses   Final diagnoses:  None    ED Discharge Orders    None       Geoffery Lyonselo, Mili Piltz, MD 03/22/18 [phone]

## 2018-03-22 NOTE — ED Notes (Signed)
Pt placed on purewick 

## 2018-03-22 NOTE — ED Triage Notes (Signed)
[facility] of a unwitnessed [facility]. Pt complaining to pain to her back and left hip. Right leg is bowed outward which normal per EMS. Hx of Alzheimer's, CHF, Afib.

## 2018-03-22 NOTE — Discharge Instructions (Addendum)
Tylenol 650 mg every 6 hours as needed for pain.  Follow-up with your primary doctor if symptoms are not improving in the next week.

## 2018-03-22 NOTE — ED Notes (Signed)
Pt asking for food 

## 2018-03-27 ENCOUNTER — Emergency Department (HOSPITAL_COMMUNITY): Payer: Medicare Other

## 2018-03-27 ENCOUNTER — Other Ambulatory Visit: Payer: Self-pay

## 2018-03-27 ENCOUNTER — Encounter (HOSPITAL_COMMUNITY): Payer: Self-pay | Admitting: Emergency Medicine

## 2018-03-27 ENCOUNTER — Emergency Department (HOSPITAL_COMMUNITY)
Admission: EM | Admit: 2018-03-27 | Discharge: 2018-03-27 | Disposition: A | Discharge status: Home / Self Care | Payer: Medicare Other | Attending: Emergency Medicine | Admitting: Emergency Medicine

## 2018-03-27 DIAGNOSIS — I509 Heart failure, unspecified: Secondary | ICD-10-CM | POA: Insufficient documentation

## 2018-03-27 DIAGNOSIS — I110 Hypertensive heart disease with heart failure: Secondary | ICD-10-CM | POA: Diagnosis not present

## 2018-03-27 DIAGNOSIS — I4891 Unspecified atrial fibrillation: Secondary | ICD-10-CM | POA: Diagnosis not present

## 2018-03-27 DIAGNOSIS — Z0389 Encounter for observation for other suspected diseases and conditions ruled out: Secondary | ICD-10-CM | POA: Diagnosis not present

## 2018-03-27 DIAGNOSIS — Z79899 Other long term (current) drug therapy: Secondary | ICD-10-CM | POA: Diagnosis not present

## 2018-03-27 DIAGNOSIS — Z0000 Encounter for general adult medical examination without abnormal findings: Secondary | ICD-10-CM

## 2018-03-27 DIAGNOSIS — R7989 Other specified abnormal findings of blood chemistry: Secondary | ICD-10-CM | POA: Diagnosis present

## 2018-03-27 DIAGNOSIS — R899 Unspecified abnormal finding in specimens from other organs, systems and tissues: Secondary | ICD-10-CM

## 2018-03-27 LAB — CBC
HEMATOCRIT: 35.6 % — AB (ref 36.0–46.0)
Hemoglobin: 11.2 g/dL — ABNORMAL LOW (ref 12.0–15.0)
MCH: 31.6 pg (ref 26.0–34.0)
MCHC: 31.5 g/dL (ref 30.0–36.0)
MCV: 100.6 fL — ABNORMAL HIGH (ref 78.0–100.0)
Platelets: 198 10*3/uL (ref 150–400)
RBC: 3.54 MIL/uL — ABNORMAL LOW (ref 3.87–5.11)
RDW: 13.5 % (ref 11.5–15.5)
WBC: 8.5 10*3/uL (ref 4.0–10.5)

## 2018-03-27 LAB — BASIC METABOLIC PANEL
Anion gap: 10 (ref 5–15)
BUN: 50 mg/dL — AB (ref 8–23)
CALCIUM: 8.5 mg/dL — AB (ref 8.9–10.3)
CO2: 30 mmol/L (ref 22–32)
Chloride: 100 mmol/L (ref 98–111)
Creatinine, Ser: 1.55 mg/dL — ABNORMAL HIGH (ref 0.44–1.00)
GFR calc Af Amer: 32 mL/min — ABNORMAL LOW (ref >60)
GFR, EST NON AFRICAN AMERICAN: 28 mL/min — AB (ref >60)
GLUCOSE: 103 mg/dL — AB (ref 70–99)
Potassium: 4 mmol/L (ref 3.5–5.1)
Sodium: 140 mmol/L (ref 135–145)

## 2018-03-27 LAB — I-STAT TROPONIN, ED: Troponin i, poc: 0 ng/mL (ref 0.00–0.08)

## 2018-03-27 LAB — BRAIN NATRIURETIC PEPTIDE: B Natriuretic Peptide: 231.1 pg/mL — ABNORMAL HIGH (ref 0.0–100.0)

## 2018-03-27 NOTE — ED Triage Notes (Signed)
[facility], fell at the fascility last night and had labs drawn this mnorning. Elevated BNP. No c/o SOB (only with excertion), no CP. No swelling noted in extremities. AOx4

## 2018-03-27 NOTE — ED Notes (Signed)
[facility] contacted [facility]

## 2018-03-27 NOTE — ED Notes (Signed)
Pt denies chest pain and shortness of breath.

## 2018-03-27 NOTE — ED Notes (Signed)
Patient transported to X-ray 

## 2018-03-27 NOTE — Discharge Instructions (Signed)
BNP in the ER is 230. X-ray does not show any worsening CHF. We suspect that BNP you have is likely a lab error.

## 2018-03-27 NOTE — ED Provider Notes (Signed)
[facility] Provider Note   CSN: [id number] Arrival date & time: 03/27/18  1726     History   Chief Complaint Chief Complaint  Patient presents with  . Abnormal Lab    HPI Jennifer Bender is a 84 y.o. female.  HPI  84 year old female with history of CHF, A. fib comes in with chief complaint of abnormal lab.  Patient resides at a nursing home and was sent to the emergency room because a routine lab work-up showed BNP greater than 5000.  Apparently patient had a fall yesterday.  Patient has no complaints from her side.  She denies any chest pain, shortness of breath, headache, weakness and is upset that she was brought to the ER in first place.  Past Medical History:  Diagnosis Date  . Arthritis   . Atrial fibrillation (HCC)   . CHF (congestive heart failure) (HCC)   . Hypertension   . Pneumonia   . UTI (lower urinary tract infection)     There are no active problems to display for this patient.   Past Surgical History:  Procedure Laterality Date  . ABDOMINAL HYSTERECTOMY    . BREAST REDUCTION SURGERY    . CARDIOVERSION    . HAND SURGERY       OB History   None      Home Medications    Prior to Admission medications   Medication Sig Start Date End Date Taking? Authorizing Provider  acetaminophen (TYLENOL) 325 MG tablet Take 650 mg by mouth every 6 (six) hours as needed for mild pain.   Yes [provider]  diltiazem (DILACOR XR) 240 MG 24 hr capsule Take 240 mg by mouth daily.   Yes [provider]  donepezil (ARICEPT) 5 MG tablet Take 5 mg by mouth daily. x2 weeks   Yes [provider]  DULoxetine (CYMBALTA) 30 MG capsule Take 30 mg by mouth daily.   Yes [provider]  fish oil-omega-3 fatty acids 1000 MG capsule Take 1 g by mouth daily.    Yes [provider]  levothyroxine (SYNTHROID, LEVOTHROID) 88 MCG tablet Take 88 mcg by mouth daily before breakfast.   Yes [provider]  Melatonin 5 MG TABS Take 5 mg by mouth at bedtime as needed (sleep).   Yes [provider]  Menthol, Topical Analgesic, (ICE BLUE EX) Apply 1 application topically 3 (three) times daily.   Yes [provider]  Multiple Vitamin (MULITIVITAMIN WITH MINERALS) TABS Take 1 tablet by mouth daily. Daily-vite   Yes [provider]  potassium chloride (K-DUR,KLOR-CON) 10 MEQ tablet Take 10 mEq by mouth daily.   Yes [provider]  Skin Protectants, Misc. (EUCERIN) cream Apply 1 application topically at bedtime.   Yes [provider]  torsemide (DEMADEX) 20 MG tablet Take 30 mg by mouth 2 (two) times daily.    Yes [provider]    Family History History reviewed. No pertinent family history.  Social History Social History   Tobacco Use  . Smoking status: Never Smoker  . Smokeless tobacco: Never Used  Substance Use Topics  . Alcohol use: No  . Drug use: No     Allergies   Ace inhibitors; Colchicine; Crestor [rosuvastatin calcium]; Hydrocodone-acetaminophen; Lipitor [atorvastatin calcium]; Morphine and related; Nsaids; Pravachol; Rosuvastatin; Tramadol; and Zocor [simvastatin]   Review of Systems Review of Systems  Constitutional: Negative for activity change.  Respiratory: Negative for shortness of breath.   Cardiovascular: Negative for  chest pain.  Skin: Positive for rash.     Physical Exam Updated Vital Signs BP (!) 131/56   Pulse 67   Temp 98.4 F (36.9 C) (Oral)   Resp 18   Ht 5\' 5"  (1.651 m)   Wt 45.4 kg (100 lb)   SpO2 98%   BMI 16.64 kg/m   Physical Exam  Constitutional: She appears well-developed.  HENT:  Head: Normocephalic and atraumatic.  Eyes: EOM are normal.  Neck: Neck supple.  Cardiovascular: Normal rate.  Pulmonary/Chest: Effort normal.  Musculoskeletal: She exhibits no edema.  Neurological: She is alert.  Skin: Skin is dry.  Nursing note and vitals reviewed.    ED Treatments  / Results  Labs (all labs ordered are listed, but only abnormal results are displayed) Labs Reviewed  BASIC METABOLIC PANEL - Abnormal; Notable for the following components:      Result Value   Glucose, Bld 103 (*)    BUN 50 (*)    Creatinine, Ser 1.55 (*)    Calcium 8.5 (*)    GFR calc non Af Amer 28 (*)    GFR calc Af Amer 32 (*)    All other components within normal limits  CBC - Abnormal; Notable for the following components:   RBC 3.54 (*)    Hemoglobin 11.2 (*)    HCT 35.6 (*)    MCV 100.6 (*)    All other components within normal limits  BRAIN NATRIURETIC PEPTIDE - Abnormal; Notable for the following components:   B Natriuretic Peptide 231.1 (*)    All other components within normal limits  I-STAT TROPONIN, ED    EKG EKG Interpretation  Date/Time:  Wednesday March 27 2018 18:43:10 EDT Ventricular Rate:  73 PR Interval:    QRS Duration: 109 QT Interval:  401 QTC Calculation: 427 R Axis:   140 Text Interpretation:  Atrial fibrillation Ventricular tachycardia, unsustained Right axis deviation Low voltage, precordial leads Borderline T abnormalities, anterior leads No acute changes Confirmed by Derwood Kaplan ([id number]) on 03/27/2018 7:30:41 PM   Radiology Dg Chest 2 View  Result Date: 03/27/2018 CLINICAL DATA:  Chest pain and shortness of breath. Fell last night. EXAM: CHEST - 2 VIEW COMPARISON:  Thoracic spine dated 03/22/2018 and chest radiographs dated 03/12/2018. FINDINGS: Stable enlarged cardiac silhouette and left subclavian pacemaker leads. Small bilateral pleural effusions, decreased. Decreased prominence of the pulmonary vascularity and interstitial markings. Old, healed right humeral neck fracture. Superior migration of the left humeral head with bony remodeling compatible with a large, chronic rotator cuff tear. Stable lower thoracic vertebral compression deformity. No acute fractures. IMPRESSION: 1. Improving changes of congestive heart failure superimposed on  COPD. 2. No acute abnormality. Electronically Signed   By: Beckie Salts M.D.   On: 03/27/2018 18:46    Procedures Procedures (including critical care time)  Medications Ordered in ED Medications - No data to display   Initial Impression / Assessment and Plan / ED Course  I have reviewed the triage vital signs and the nursing notes.  Pertinent labs & imaging results that were available during my care of the patient were reviewed by me and considered in my medical decision making (see chart for details).     84 year old female comes in with chief complaint of abnormal labs. Patient has history of CHF, A. fib and resides at the nursing home.  Apparently patient had a fall yesterday, routine labs were ordered today and her BNP was noted to be greater than 5000 therefore she  was sent to the ED.  Patient denies any chest pain, shortness of breath.  She has no JVD, no rales on her exam, no respiratory distress and no pitting edema.  X-ray did not reveal any evidence of volume overload either.  BNP at our system is 200.   Clearly it seems like the lab had an erroneous result at the outside facility.  We have called the nursing home and informed them about our findings.  They had no other reason to send her to the ER besides the abnormal lab therefore we will discharge her.  Final Clinical Impressions(s) / ED Diagnoses   Final diagnoses:  Abnormal laboratory test  Normal exam    ED Discharge Orders    None       Derwood Kaplan, MD 03/27/18 2028

## 2018-03-31 ENCOUNTER — Emergency Department (HOSPITAL_COMMUNITY): Payer: Medicare Other

## 2018-03-31 ENCOUNTER — Encounter (HOSPITAL_COMMUNITY): Payer: Self-pay | Admitting: Emergency Medicine

## 2018-03-31 ENCOUNTER — Emergency Department (HOSPITAL_COMMUNITY)
Admission: EM | Admit: 2018-03-31 | Discharge: 2018-03-31 | Disposition: A | Discharge status: Home / Self Care | Payer: Medicare Other | Attending: Emergency Medicine | Admitting: Emergency Medicine

## 2018-03-31 ENCOUNTER — Other Ambulatory Visit: Payer: Self-pay

## 2018-03-31 DIAGNOSIS — Y92129 Unspecified place in nursing home as the place of occurrence of the external cause: Secondary | ICD-10-CM | POA: Diagnosis not present

## 2018-03-31 DIAGNOSIS — F0390 Unspecified dementia without behavioral disturbance: Secondary | ICD-10-CM | POA: Diagnosis not present

## 2018-03-31 DIAGNOSIS — S0990XA Unspecified injury of head, initial encounter: Secondary | ICD-10-CM | POA: Diagnosis not present

## 2018-03-31 DIAGNOSIS — I110 Hypertensive heart disease with heart failure: Secondary | ICD-10-CM | POA: Insufficient documentation

## 2018-03-31 DIAGNOSIS — W19XXXA Unspecified fall, initial encounter: Secondary | ICD-10-CM | POA: Insufficient documentation

## 2018-03-31 DIAGNOSIS — Z79899 Other long term (current) drug therapy: Secondary | ICD-10-CM | POA: Diagnosis not present

## 2018-03-31 DIAGNOSIS — Y939 Activity, unspecified: Secondary | ICD-10-CM | POA: Diagnosis not present

## 2018-03-31 DIAGNOSIS — Y998 Other external cause status: Secondary | ICD-10-CM | POA: Insufficient documentation

## 2018-03-31 DIAGNOSIS — I509 Heart failure, unspecified: Secondary | ICD-10-CM | POA: Insufficient documentation

## 2018-03-31 DIAGNOSIS — I4891 Unspecified atrial fibrillation: Secondary | ICD-10-CM | POA: Diagnosis not present

## 2018-03-31 NOTE — ED Notes (Signed)
Bed: WA18 Expected date:  Expected time:  Means of arrival:  Comments: EMS 

## 2018-03-31 NOTE — ED Provider Notes (Signed)
[facility]-EMERGENCY DEPT Provider Note   CSN: [id number] Arrival date & time: 03/31/18  0032     History   Chief Complaint Chief Complaint  Patient presents with  . Fall   Level 5 caveat: Dementia  HPI Jennifer Bender is a 84 y.o. female.  HPI 84 year old female presents to the emergency department with a reported fall from her nursing home.  She has a history of dementia.  She has no complaints at this time.  Staff reports that she tripped over her laces of her shoe.  Patient reports some pain in the back of her head.  No laceration.  No shortening of her legs or rotation of her legs.  Moves all 4 extremities.  Nursing facility reports baseline mental status.   Past Medical History:  Diagnosis Date  . Arthritis   . Atrial fibrillation (HCC)   . CHF (congestive heart failure) (HCC)   . Hypertension   . Pneumonia   . UTI (lower urinary tract infection)     There are no active problems to display for this patient.   Past Surgical History:  Procedure Laterality Date  . ABDOMINAL HYSTERECTOMY    . BREAST REDUCTION SURGERY    . CARDIOVERSION    . HAND SURGERY       OB History   None      Home Medications    Prior to Admission medications   Medication Sig Start Date End Date Taking? Authorizing Provider  diltiazem (DILACOR XR) 240 MG 24 hr capsule Take 240 mg by mouth daily.   Yes [provider]  donepezil (ARICEPT) 5 MG tablet Take 5 mg by mouth daily. x2 weeks   Yes [provider]  DULoxetine (CYMBALTA) 30 MG capsule Take 30 mg by mouth daily.   Yes [provider]  fish oil-omega-3 fatty acids 1000 MG capsule Take 1 g by mouth daily.    Yes [provider]  levothyroxine (SYNTHROID, LEVOTHROID) 88 MCG tablet Take 88 mcg by mouth daily before breakfast.   Yes [provider]  Menthol, Topical Analgesic, (ICE BLUE EX) Apply 1 application topically 3 (three) times daily.   Yes [provider]  Multiple Vitamin (MULITIVITAMIN WITH MINERALS) TABS Take 1 tablet by mouth daily. Daily-vite   Yes [provider]  potassium chloride (K-DUR,KLOR-CON) 10 MEQ tablet Take 10 mEq by mouth daily.   Yes [provider]  Skin Protectants, Misc. (EUCERIN) cream Apply 1 application topically at bedtime.   Yes [provider]  torsemide (DEMADEX) 20 MG tablet Take 30 mg by mouth 2 (two) times daily.    Yes [provider]  acetaminophen (TYLENOL) 325 MG tablet Take 650 mg by mouth every 6 (six) hours as needed for mild pain.    [provider]  Melatonin 5 MG TABS Take 5 mg by mouth at bedtime as needed (sleep).    [provider]    Family History No family history on file.  Social History Social History   Tobacco Use  . Smoking status: Never Smoker  . Smokeless tobacco: Never Used  Substance Use Topics  . Alcohol use: No  . Drug use: No     Allergies   Ace inhibitors; Colchicine; Crestor [rosuvastatin calcium]; Hydrocodone-acetaminophen; Lipitor [atorvastatin calcium]; Morphine and related; Nsaids; Pravachol; Rosuvastatin; Tramadol; and Zocor [simvastatin]   Review of Systems Review of Systems  Unable to perform ROS: Dementia     Physical Exam Updated Vital Signs BP Marland Kitchen(!)  154/71 (BP Location: Left Arm)   Pulse 83   Temp 98 F (36.7 C) (Oral)   Resp 18   Ht 5\' 5"  (1.651 m)   Wt 45.4 kg   SpO2 97%   BMI 16.64 kg/m   Physical Exam  Constitutional: She appears well-developed and well-nourished. No distress.  HENT:  Small posterior scalp hematoma without laceration  Eyes: EOM are normal.  Neck: Normal range of motion. Neck supple.  No C-spine tenderness.  Cardiovascular: Normal rate, regular rhythm and normal heart sounds.  Pulmonary/Chest: Effort normal and breath sounds normal.  Abdominal: Soft. She exhibits no distension. There is no tenderness.  Musculoskeletal: Normal range of motion.  Full range of motion of  bilateral shoulders, elbows, wrist.  Full range of motion bilateral hips, knees, ankles.  Skin tear to her right posterior forearm without secondary signs of infection.  No active bleeding.  Normal grip strength bilaterally.  Neurological: She is alert.  Skin: Skin is warm and dry.  Psychiatric: She has a normal mood and affect. Judgment normal.  Nursing note and vitals reviewed.    ED Treatments / Results  Labs (all labs ordered are listed, but only abnormal results are displayed) Labs Reviewed - No data to display  EKG None  Radiology Ct Head Wo Contrast  Result Date: 03/31/2018 CLINICAL DATA:  84 y/o F; status post fall. History of dementia and recent falls. EXAM: CT HEAD WITHOUT CONTRAST TECHNIQUE: Contiguous axial images were obtained from the base of the skull through the vertex without intravenous contrast. COMPARISON:  02/20/2018 CT head. FINDINGS: Brain: No evidence of acute infarction, hemorrhage, hydrocephalus, extra-axial collection or mass lesion/mass effect. Stable chronic microvascular ischemic changes and volume loss of the brain. Vascular: Calcific atherosclerosis of carotid siphons and vertebral arteries. Skull: Normal. Negative for fracture or focal lesion. Sinuses/Orbits: Chronic inflammatory changes of the wall of left sphenoid sinus which is well aerated. Mild mucosal thickening in right anterior ethmoid and frontal sinus. Normal aeration of mastoid air cells. Right intra-ocular lens replacement. Other: None. IMPRESSION: 1. No acute intracranial abnormality or calvarial fracture. 2. Stable chronic microvascular ischemic changes and volume loss of the brain. Electronically Signed   By: Mitzi Hansen M.D.   On: 03/31/2018 02:50    Procedures Procedures (including critical care time)  Medications Ordered in ED Medications - No data to display   Initial Impression / Assessment and Plan / ED Course  I have reviewed the triage vital signs and the nursing  notes.  Pertinent labs & imaging results that were available during my care of the patient were reviewed by me and considered in my medical decision making (see chart for details).     Chest and abdomen are benign.  Full range of motion of major joints.  CT imaging of the head without acute abnormality.  C-spine nontender.  Normal neurologic exam.  Primary care follow-up.  No indication for additional testing or work-up at this time.   Final Clinical Impressions(s) / ED Diagnoses   Final diagnoses:  Closed head injury, initial encounter  Fall, initial encounter    ED Discharge Orders    None       Azalia Bilis, MD 03/31/18 [phone]

## 2018-03-31 NOTE — ED Triage Notes (Signed)
Pt BIB EMS from [facility] s/p fall. Patient with a skin tear to her right arm but no other injuries noted. Patient hx dementia, unable to tell us if she hurt anything else. No shortening or rotation noted to either hip. Per staff patient only had one shoe on for unknown reason and tripped over the laces.

## 2018-03-31 NOTE — ED Notes (Signed)
[facility] called for transport.  

## 2018-03-31 NOTE — ED Notes (Signed)
[facility] at bedside 

## 2018-03-31 NOTE — ED Notes (Signed)
Patient unable to sign for self, signed for patient and witnessed by I. Coggin, CN

## 2018-05-28 ENCOUNTER — Encounter (HOSPITAL_COMMUNITY): Payer: Self-pay

## 2018-05-28 ENCOUNTER — Emergency Department (HOSPITAL_COMMUNITY): Payer: Medicare Other

## 2018-05-28 ENCOUNTER — Other Ambulatory Visit: Payer: Self-pay

## 2018-05-28 ENCOUNTER — Emergency Department (HOSPITAL_COMMUNITY)
Admission: EM | Admit: 2018-05-28 | Discharge: 2018-05-28 | Payer: Medicare Other | Attending: Emergency Medicine | Admitting: Emergency Medicine

## 2018-05-28 DIAGNOSIS — Y929 Unspecified place or not applicable: Secondary | ICD-10-CM | POA: Insufficient documentation

## 2018-05-28 DIAGNOSIS — Y999 Unspecified external cause status: Secondary | ICD-10-CM | POA: Insufficient documentation

## 2018-05-28 DIAGNOSIS — W0110XA Fall on same level from slipping, tripping and stumbling with subsequent striking against unspecified object, initial encounter: Secondary | ICD-10-CM | POA: Diagnosis not present

## 2018-05-28 DIAGNOSIS — Z79899 Other long term (current) drug therapy: Secondary | ICD-10-CM | POA: Diagnosis not present

## 2018-05-28 DIAGNOSIS — W19XXXA Unspecified fall, initial encounter: Secondary | ICD-10-CM

## 2018-05-28 DIAGNOSIS — Y9301 Activity, walking, marching and hiking: Secondary | ICD-10-CM | POA: Insufficient documentation

## 2018-05-28 DIAGNOSIS — S0101XA Laceration without foreign body of scalp, initial encounter: Secondary | ICD-10-CM | POA: Diagnosis not present

## 2018-05-28 DIAGNOSIS — I509 Heart failure, unspecified: Secondary | ICD-10-CM | POA: Diagnosis not present

## 2018-05-28 DIAGNOSIS — I110 Hypertensive heart disease with heart failure: Secondary | ICD-10-CM | POA: Diagnosis not present

## 2018-05-28 DIAGNOSIS — S0990XA Unspecified injury of head, initial encounter: Secondary | ICD-10-CM | POA: Diagnosis present

## 2018-05-28 LAB — BASIC METABOLIC PANEL
Anion gap: 14 (ref 5–15)
BUN: 62 mg/dL — ABNORMAL HIGH (ref 8–23)
CHLORIDE: 98 mmol/L (ref 98–111)
CO2: 27 mmol/L (ref 22–32)
CREATININE: 2.08 mg/dL — AB (ref 0.44–1.00)
Calcium: 9 mg/dL (ref 8.9–10.3)
GFR calc Af Amer: 23 mL/min — ABNORMAL LOW (ref >60)
GFR, EST NON AFRICAN AMERICAN: 20 mL/min — AB (ref >60)
Glucose, Bld: 124 mg/dL — ABNORMAL HIGH (ref 70–99)
POTASSIUM: 4 mmol/L (ref 3.5–5.1)
Sodium: 139 mmol/L (ref 135–145)

## 2018-05-28 LAB — CBC
HCT: 36.3 % (ref 36.0–46.0)
Hemoglobin: 11.5 g/dL — ABNORMAL LOW (ref 12.0–15.0)
MCH: 31.5 pg (ref 26.0–34.0)
MCHC: 31.7 g/dL (ref 30.0–36.0)
MCV: 99.5 fL (ref 80.0–100.0)
PLATELETS: 234 10*3/uL (ref 150–400)
RBC: 3.65 MIL/uL — AB (ref 3.87–5.11)
RDW: 13.2 % (ref 11.5–15.5)
WBC: 7.3 10*3/uL (ref 4.0–10.5)
nRBC: 0 % (ref 0.0–0.2)

## 2018-05-28 LAB — TROPONIN I: Troponin I: <0.03 ng/mL (ref <0.03)

## 2018-05-28 MED ORDER — LIDOCAINE-EPINEPHRINE (PF) 2 %-1:200000 IJ SOLN
20.0000 mL | Freq: Once | INTRAMUSCULAR | Status: AC
  Administered 2018-05-28: 20 mL via INTRADERMAL

## 2018-05-28 NOTE — ED Notes (Signed)
[facility] called for transport to [facility], 8th on list

## 2018-05-28 NOTE — Discharge Instructions (Addendum)
Follow up with your primary care doctor, staples will need to be removed in 7-10 days

## 2018-05-28 NOTE — ED Notes (Signed)
ED Provider at bedside. 

## 2018-05-28 NOTE — ED Provider Notes (Signed)
[facility] EMERGENCY DEPARTMENT Provider Note   CSN: [id number] Arrival date & time: 05/28/18  1706     History   Chief Complaint Chief Complaint  Patient presents with  . Fall  . Chest Pain    HPI Jennifer Bender is a 84 y.o. female.  HPI Patient presents to the emergency room for evaluation of a head injury after a fall.  Patient was at her nursing facility.  She was walking with her walker when she fell backwards.  Patient hit her head on a chair and sustained a laceration.  Patient did not lose consciousness.  She does complain of some pain at the top of her head and also at the base of her neck.  She initially complained of some pain in her chest after the fall but denies any pain currently.  Patient has history of dementia so she has some difficulty with her memory.  Denies any arm pain or leg pain.  She denies any abdominal pain.  No complaints of any numbness or weakness. Past Medical History:  Diagnosis Date  . Arthritis   . Atrial fibrillation (HCC)   . CHF (congestive heart failure) (HCC)   . Hypertension   . Pneumonia   . UTI (lower urinary tract infection)     There are no active problems to display for this patient.   Past Surgical History:  Procedure Laterality Date  . ABDOMINAL HYSTERECTOMY    . BREAST REDUCTION SURGERY    . CARDIOVERSION    . HAND SURGERY       OB History   None      Home Medications    Prior to Admission medications   Medication Sig Start Date End Date Taking? Authorizing Provider  acetaminophen (TYLENOL) 325 MG tablet Take 650 mg by mouth every 6 (six) hours as needed for mild pain.   Yes [provider]  diltiazem (DILACOR XR) 240 MG 24 hr capsule Take 240 mg by mouth daily.   Yes [provider]  DULoxetine (CYMBALTA) 30 MG capsule Take 30 mg by mouth daily.   Yes [provider]  fish oil-omega-3 fatty acids 1000 MG capsule Take 1 g by mouth daily.    Yes [provider]  levothyroxine (SYNTHROID, LEVOTHROID) 88 MCG tablet Take 88 mcg by mouth daily before breakfast.   Yes [provider]  Melatonin 5 MG TABS Take 5 mg by mouth at bedtime as needed (sleep).   Yes [provider]  Menthol, Topical Analgesic, (ICE BLUE EX) Apply 1 application topically 3 (three) times daily.   Yes [provider]  Multiple Vitamin (MULITIVITAMIN WITH MINERALS) TABS Take 1 tablet by mouth daily. Daily-vite   Yes [provider]  potassium chloride (K-DUR,KLOR-CON) 10 MEQ tablet Take 10 mEq by mouth daily.   Yes [provider]  Skin Protectants, Misc. (EUCERIN) cream Apply 1 application topically at bedtime.   Yes [provider]  torsemide (DEMADEX) 20 MG tablet Take 30 mg by mouth 2 (two) times daily.    Yes [provider]    Family History No family history on file.  Social History Social History   Tobacco Use  . Smoking status: Never Smoker  . Smokeless tobacco: Never Used  Substance Use Topics  . Alcohol use: No  . Drug use: No     Allergies   Ace inhibitors; Colchicine; Crestor [rosuvastatin calcium]; Ezetimibe; Hydrocodone-acetaminophen; Lipitor [atorvastatin calcium]; Morphine and related; Nsaids; Pravachol; Rosuvastatin; Shellfish allergy;  Tolmetin; Tramadol; and Zocor [simvastatin]   Review of Systems Review of Systems  All other systems reviewed and are negative.    Physical Exam Updated Vital Signs BP (!) 126/100   Pulse 69   Temp 97.8 F (36.6 C) (Oral)   Resp 18   Ht 1.6 m (5\' 3" )   Wt 45.4 kg   SpO2 95%   BMI 17.73 kg/m   Physical Exam  Constitutional: No distress.  Elderly, frail  HENT:  Head: Normocephalic.  Right Ear: External ear normal.  Left Ear: External ear normal.  Dried blood on the top of her scalp and in her hair  Eyes: Conjunctivae are normal. Right eye exhibits no discharge. Left eye exhibits no discharge. No scleral icterus.  Neck: Neck supple. No  tracheal deviation present.  Cardiovascular: Normal rate, regular rhythm and intact distal pulses.  Pulmonary/Chest: Effort normal and breath sounds normal. No stridor. No respiratory distress. She has no wheezes. She has no rales.  Abdominal: Soft. Bowel sounds are normal. She exhibits no distension. There is no tenderness. There is no rebound and no guarding.  Musculoskeletal: She exhibits no edema.       Right shoulder: She exhibits no tenderness, no bony tenderness and no swelling.       Left shoulder: She exhibits no tenderness, no bony tenderness and no swelling.       Right wrist: She exhibits no tenderness, no bony tenderness and no swelling.       Left wrist: She exhibits no tenderness, no bony tenderness and no swelling.       Right hip: She exhibits normal range of motion, no tenderness, no bony tenderness and no swelling.       Left hip: She exhibits normal range of motion, no tenderness and no bony tenderness.       Right ankle: She exhibits no swelling. No tenderness.       Left ankle: She exhibits no swelling. No tenderness.       Cervical back: She exhibits tenderness. She exhibits no bony tenderness and no swelling.       Thoracic back: She exhibits no tenderness, no bony tenderness and no swelling.       Lumbar back: She exhibits no tenderness, no bony tenderness and no swelling.  Neurological: She is alert. She has normal strength. No cranial nerve deficit (no facial droop, extraocular movements intact, no slurred speech) or sensory deficit. She exhibits normal muscle tone. She displays no seizure activity. Coordination normal.  Skin: Skin is warm and dry. No rash noted.  Psychiatric: She has a normal mood and affect.  Nursing note and vitals reviewed.    ED Treatments / Results  Labs (all labs ordered are listed, but only abnormal results are displayed) Labs Reviewed  CBC - Abnormal; Notable for the following components:      Result Value   RBC 3.65 (*)    Hemoglobin  11.5 (*)    All other components within normal limits  BASIC METABOLIC PANEL - Abnormal; Notable for the following components:   Glucose, Bld 124 (*)    BUN 62 (*)    Creatinine, Ser 2.08 (*)    GFR calc non Af Amer 20 (*)    GFR calc Af Amer 23 (*)    All other components within normal limits  TROPONIN I    EKG EKG Interpretation  Date/Time:  Tuesday May 28 2018 17:14:02 EDT Ventricular Rate:  64 PR Interval:    QRS  Duration: 157 QT Interval:  454 QTC Calculation: 476 R Axis:   86 Text Interpretation:  Atrial fibrillation Right bundle branch block No significant change since last tracing Confirmed by Linwood Dibbles [phone]) on 05/28/2018 5:20:34 PM   Radiology Ct Head Wo Contrast  Result Date: 05/28/2018 CLINICAL DATA:  84 year old female with acute head and neck injury from fall today. EXAM: CT HEAD WITHOUT CONTRAST CT CERVICAL SPINE WITHOUT CONTRAST TECHNIQUE: Multidetector CT imaging of the head and cervical spine was performed following the standard protocol without intravenous contrast. Multiplanar CT image reconstructions of the cervical spine were also generated. COMPARISON:  03/31/2018 and prior CTs FINDINGS: CT HEAD FINDINGS Brain: No evidence of acute infarction, hemorrhage, hydrocephalus, extra-axial collection or mass lesion/mass effect. Atrophy and mild chronic small-vessel white matter ischemic changes again noted. Vascular: Atherosclerotic calcifications again identified. Skull: Normal. Negative for fracture or focal lesion. Sinuses/Orbits: Opacified RIGHT frontal, anterior ethmoid and maxillary sinuses noted. Other: High scalp soft tissue swelling noted without fracture. CT CERVICAL SPINE FINDINGS Alignment: Normal. Skull base and vertebrae: No acute fracture. T2 compression fracture is again noted. No primary bone lesion or focal pathologic process. Soft tissues and spinal canal: No prevertebral fluid or swelling. No visible canal hematoma. Disc levels: Mild to moderate  multilevel degenerative disc disease, spondylosis and facet arthropathy noted. Upper chest: No acute abnormality Other: None IMPRESSION: 1. No evidence of acute intracranial abnormality. Atrophy and chronic small-vessel white matter ischemic changes. 2. No static evidence of acute injury to the cervical spine. 3. Scalp soft tissue swelling without acute fracture. 4. Remote T2 compression fracture 5. RIGHT paranasal sinusitis. Electronically Signed   By: Harmon Pier M.D.   On: 05/28/2018 19:44   Ct Cervical Spine Wo Contrast  Result Date: 05/28/2018 CLINICAL DATA:  84 year old female with acute head and neck injury from fall today. EXAM: CT HEAD WITHOUT CONTRAST CT CERVICAL SPINE WITHOUT CONTRAST TECHNIQUE: Multidetector CT imaging of the head and cervical spine was performed following the standard protocol without intravenous contrast. Multiplanar CT image reconstructions of the cervical spine were also generated. COMPARISON:  03/31/2018 and prior CTs FINDINGS: CT HEAD FINDINGS Brain: No evidence of acute infarction, hemorrhage, hydrocephalus, extra-axial collection or mass lesion/mass effect. Atrophy and mild chronic small-vessel white matter ischemic changes again noted. Vascular: Atherosclerotic calcifications again identified. Skull: Normal. Negative for fracture or focal lesion. Sinuses/Orbits: Opacified RIGHT frontal, anterior ethmoid and maxillary sinuses noted. Other: High scalp soft tissue swelling noted without fracture. CT CERVICAL SPINE FINDINGS Alignment: Normal. Skull base and vertebrae: No acute fracture. T2 compression fracture is again noted. No primary bone lesion or focal pathologic process. Soft tissues and spinal canal: No prevertebral fluid or swelling. No visible canal hematoma. Disc levels: Mild to moderate multilevel degenerative disc disease, spondylosis and facet arthropathy noted. Upper chest: No acute abnormality Other: None IMPRESSION: 1. No evidence of acute intracranial  abnormality. Atrophy and chronic small-vessel white matter ischemic changes. 2. No static evidence of acute injury to the cervical spine. 3. Scalp soft tissue swelling without acute fracture. 4. Remote T2 compression fracture 5. RIGHT paranasal sinusitis. Electronically Signed   By: Harmon Pier M.D.   On: 05/28/2018 19:44    Procedures .Marland KitchenLaceration Repair Date/Time: 05/28/2018 8:33 PM Performed by: Linwood Dibbles, MD Authorized by: Linwood Dibbles, MD   Consent:    Consent obtained:  Verbal   Consent given by:  Patient   Risks discussed:  Infection, need for additional repair, pain, poor cosmetic result and poor wound healing  Alternatives discussed:  No treatment and delayed treatment Universal protocol:    Procedure explained and questions answered to patient or proxy's satisfaction: yes     Relevant documents present and verified: yes     Test results available and properly labeled: yes     Imaging studies available: yes     Required blood products, implants, devices, and special equipment available: yes     Site/side marked: yes     Immediately prior to procedure, a time out was called: yes     Patient identity confirmed:  Verbally with patient Anesthesia (see MAR for exact dosages):    Anesthesia method:  Local infiltration   Local anesthetic:  Lidocaine 1% WITH epi Laceration details:    Location:  Scalp   Scalp location:  Mid-scalp   Length (cm):  2.5 Repair type:    Repair type:  Simple Pre-procedure details:    Preparation:  Patient was prepped and draped in usual sterile fashion Exploration:    Wound extent: no foreign bodies/material noted, no muscle damage noted and no underlying fracture noted     Contaminated: no   Treatment:    Area cleansed with:  Saline   Amount of cleaning:  Standard   Irrigation solution:  Sterile saline   Irrigation method:  Syringe   Visualized foreign bodies/material removed: no   Skin repair:    Repair method:  Staples   Number of staples:   5 Approximation:    Approximation:  Close Post-procedure details:    Dressing:  Open (no dressing)   Patient tolerance of procedure:  Tolerated well, no immediate complications   (including critical care time)  Medications Ordered in ED Medications  lidocaine-EPINEPHrine (XYLOCAINE W/EPI) 2 %-1:200000 (PF) injection 20 mL (20 mLs Intradermal Given 05/28/18 1954)     Initial Impression / Assessment and Plan / ED Course  I have reviewed the triage vital signs and the nursing notes.  Pertinent labs & imaging results that were available during my care of the patient were reviewed by me and considered in my medical decision making (see chart for details).    Labs reviewed.  Evaded BUN and creatinine is chronic.  Hemoglobin is stable compared to previous.  EKG without concerning features and troponin is normal.  Patient did not complain of any chest pain to me.  I suspect she may have had some chest wall injury associated with her fall.  I doubt acute coronary syndrome.  Head CT and cervical spine CT did not show any fractures or intracranial bleeding.  Patient's scalp laceration was repaired without difficulty.  She is stable for discharge back to the nursing facility  Final Clinical Impressions(s) / ED Diagnoses   Final diagnoses:  Laceration of scalp, initial encounter  Fall, initial encounter    ED Discharge Orders    None       Linwood Dibbles, MD 05/28/18 2034

## 2018-05-28 NOTE — ED Notes (Signed)
MD at bedside. 

## 2018-05-28 NOTE — ED Triage Notes (Signed)
Pt from [facility] for fall and chest pain. Pt was walking with walker when she fell backwards, hitting her head on a chair, 2am lac to back of head. No LOC, no blood thinners. Pt immediately c.o chest pain after the fall but denies pain at this time. Pt has hx of dementia and is at baseline. Has pacemaker. Pt alert,nad noted.  Bleeding controlled at this time  Bp 122/56 HR 60 paced [id number] 95% room air CBG 145

## 2018-05-30 ENCOUNTER — Encounter (HOSPITAL_COMMUNITY): Payer: Self-pay

## 2018-05-30 ENCOUNTER — Emergency Department (HOSPITAL_COMMUNITY): Payer: Medicare Other

## 2018-05-30 ENCOUNTER — Other Ambulatory Visit: Payer: Self-pay

## 2018-05-30 ENCOUNTER — Emergency Department (HOSPITAL_COMMUNITY)
Admission: EM | Admit: 2018-05-30 | Discharge: 2018-05-30 | Disposition: A | Discharge status: Home / Self Care | Payer: Medicare Other | Attending: Emergency Medicine | Admitting: Emergency Medicine

## 2018-05-30 DIAGNOSIS — I509 Heart failure, unspecified: Secondary | ICD-10-CM | POA: Insufficient documentation

## 2018-05-30 DIAGNOSIS — Z79899 Other long term (current) drug therapy: Secondary | ICD-10-CM | POA: Diagnosis not present

## 2018-05-30 DIAGNOSIS — I110 Hypertensive heart disease with heart failure: Secondary | ICD-10-CM | POA: Diagnosis not present

## 2018-05-30 DIAGNOSIS — R52 Pain, unspecified: Secondary | ICD-10-CM | POA: Diagnosis present

## 2018-05-30 DIAGNOSIS — F0391 Unspecified dementia with behavioral disturbance: Secondary | ICD-10-CM | POA: Diagnosis not present

## 2018-05-30 DIAGNOSIS — W19XXXA Unspecified fall, initial encounter: Secondary | ICD-10-CM

## 2018-05-30 NOTE — ED Notes (Addendum)
ED Provider at bedside. Purewick placed on pt at this time.

## 2018-05-30 NOTE — ED Provider Notes (Signed)
[facility] EMERGENCY DEPARTMENT Provider Note   CSN: [id number] Arrival date & time: 05/30/18  0755     History   Chief Complaint Chief Complaint  Patient presents with  . Fall    HPI Jennifer Bender is a 84 y.o. female.  HPI  Pt arrives to ED from [facility] with complaints of two falls this morning and neck and bilateral hip pain. EMS reports pt was found by staff early this morning on the floor and "refused care" but was returned to bed. Pt found again screaming out on the floor a few hours later and EMS was called. Pt has dementia and is confused baseline.   Patient has been alert and interactive with me.  She can very very talkative and talk about her past extensively.  She does not recall falling or what occurred earlier in the day.  She does not localize pain.  Sometimes she tells me she has no pain in the another time she endorses pain. Past Medical History:  Diagnosis Date  . Arthritis   . Atrial fibrillation (HCC)   . CHF (congestive heart failure) (HCC)   . Hypertension   . Pneumonia   . UTI (lower urinary tract infection)     There are no active problems to display for this patient.   Past Surgical History:  Procedure Laterality Date  . ABDOMINAL HYSTERECTOMY    . BREAST REDUCTION SURGERY    . CARDIOVERSION    . HAND SURGERY       OB History   None      Home Medications    Prior to Admission medications   Medication Sig Start Date End Date Taking? Authorizing Provider  acetaminophen (TYLENOL) 325 MG tablet Take 650 mg by mouth 4 (four) times daily as needed for mild pain.    Yes [provider]  diltiazem (DILACOR XR) 240 MG 24 hr capsule Take 240 mg by mouth daily.   Yes [provider]  DULoxetine (CYMBALTA) 30 MG capsule Take 30 mg by mouth daily.   Yes [provider]  fish oil-omega-3 fatty acids 1000 MG capsule Take 1 g by mouth daily.    Yes [provider]  levothyroxine (SYNTHROID,  LEVOTHROID) 88 MCG tablet Take 88 mcg by mouth daily before breakfast.   Yes [provider]  Melatonin 5 MG TABS Take 5 mg by mouth at bedtime as needed (sleep).   Yes [provider]  Menthol, Topical Analgesic, (ICE BLUE EX) Apply 1 application topically 3 (three) times daily.   Yes [provider]  Multiple Vitamin (MULITIVITAMIN WITH MINERALS) TABS Take 1 tablet by mouth daily. Daily-vite   Yes [provider]  potassium chloride (K-DUR,KLOR-CON) 10 MEQ tablet Take 10 mEq by mouth daily.   Yes [provider]  Skin Protectants, Misc. (EUCERIN) cream Apply 1 application topically at bedtime.   Yes [provider]  torsemide (DEMADEX) 20 MG tablet Take 40 mg by mouth 2 (two) times daily.    Yes [provider]    Family History History reviewed. No pertinent family history.  Social History Social History   Tobacco Use  . Smoking status: Never Smoker  . Smokeless tobacco: Never Used  Substance Use Topics  . Alcohol use: No  . Drug use: No     Allergies   Ace inhibitors; Colchicine; Crestor [rosuvastatin calcium]; Ezetimibe; Hydrocodone-acetaminophen; Lipitor [atorvastatin calcium]; Morphine and related; Nsaids; Pravachol; Rosuvastatin; Shellfish allergy; Tolmetin; Tramadol; and Zocor [simvastatin]  Review of Systems Review of Systems Level 5 caveat cannot obtain review of systems due to dementia.  Physical Exam Updated Vital Signs BP (!) 156/79   Pulse 69   Temp 97.8 F (36.6 C) (Oral)   Resp 14   Ht 5\' 3"  (1.6 m)   Wt 45.4 kg   SpO2 98%   BMI 17.73 kg/m   Physical Exam  Constitutional:  Patient is alert and lying in the bed.  She is in no acute distress.  No respiratory distress.  HENT:  Patient has previously stapled scalp laceration to the vertex of her head.  This is very good condition.  No other evident facial contusions or scalp hematomas.  Eyes: Pupils are equal, round, and reactive to light.  EOM are normal.  Neck:  Patient exhibits no pain to palpation of the cervical spine.  Cardiovascular: Normal rate, regular rhythm and intact distal pulses.  Pulmonary/Chest: Effort normal and breath sounds normal. She exhibits no tenderness.  Abdominal: Soft. She exhibits no distension. There is no tenderness. There is no guarding.  Patient's abdomen is nontender.  She initially had a full bladder.  This was palpable.  She reported having to go to the bathroom.  Once [place] placed, she was able to urinate freely and I could palpate her abdomen with no discomfort.  Musculoskeletal:  No evident new deformities.  Patient has extensive arthritis changes of her knees and other joints.  none joints are warm or have effusion present.  She can clasp each of my hands and her hands and with pleasant conversation we can go through handshake motions and move her elbows wrists and shoulders without objection.  Also, with continuous pleasant conversation I am able to palpate along her lower extremities and hips without her objecting or complaining of pain.  She has very large arthritic knees and clearly has extensive chronic bony degeneration.  However I am able to do some flexion extension at the hips and knees as we converse with outpatient crying out or making any verbal objections.  Same with the back.  Patient lying on her side will speak with me continuously I have examined and palpated the bony prominences of her back without eliciting objection.  Neurological: She is alert. She exhibits normal muscle tone. Coordination normal.  Skin: Skin is warm and dry.  Psychiatric: She has a normal mood and affect.     ED Treatments / Results  Labs (all labs ordered are listed, but only abnormal results are displayed) Labs Reviewed - No data to display  EKG EKG Interpretation  Date/Time:  Thursday May 30 2018 08:09:54 EDT Ventricular Rate:  75 PR Interval:    QRS Duration: 155 QT Interval:  423 QTC  Calculation: 473 R Axis:   -111 Text Interpretation:  Sinus rhythm Multiform ventricular premature complexes Short PR interval RBBB and LAFB Baseline wander in lead(s) V4 old RBBB. not sig different from previous Confirmed by Arby Barrette [phone]) on 05/30/2018 8:46:34 AM   Radiology Dg Chest 2 View  Result Date: 05/30/2018 CLINICAL DATA:  Fall EXAM: CHEST - 2 VIEW COMPARISON:  03/27/2018 FINDINGS: Left pacer in place with leads in the right atrium and right ventricle. Cardiomegaly. No overt edema or confluent opacities. No effusions or acute bony abnormality. IMPRESSION: Cardiomegaly.  No acute cardiopulmonary disease. Electronically Signed   By: Charlett Nose M.D.   On: 05/30/2018 12:49   Dg Lumbar Spine 2-3 Views  Result Date: 05/30/2018 CLINICAL DATA:  Recent fall, diffuse pain EXAM:  LUMBAR SPINE - 2-3 VIEW COMPARISON:  Lumbar spine films of 03/22/2017 and CT lumbar spine of 02/27/2017 FINDINGS: The old compression deformities of T12 and to a lesser degree T11 are again noted without interval change. No new compression deformity is seen. Lumbar vertebrae are unchanged in alignment with very slight anterolisthesis of L5 on S1 most likely due to degenerative change. Intervertebral disc spaces appear normal. Moderate abdominal aortic atherosclerosis is present. IMPRESSION: No new compression deformity is seen. No change in alignment and intervertebral disc spaces. Electronically Signed   By: Dwyane Dee M.D.   On: 05/30/2018 12:51   Dg Pelvis 1-2 Views  Result Date: 05/30/2018 CLINICAL DATA:  Recent fall, diffuse pain EXAM: PELVIS - 1-2 VIEW COMPARISON:  CT abdomen pelvis of 02/27/2017 FINDINGS: No hip fracture is seen. The bones are osteopenic in this elderly patient. There is irregularity of the right inferior pelvic ramus which may represent fracture, possibly old. Clinical correlation is recommended. The SI joints appear normal. IMPRESSION: 1. No hip fracture is seen. 2. There is abnormality  of the inferior right pelvic ramus which represent an old fracture, but correlate clinically. Electronically Signed   By: Dwyane Dee M.D.   On: 05/30/2018 12:49   Ct Head Wo Contrast  Result Date: 05/28/2018 CLINICAL DATA:  84 year old female with acute head and neck injury from fall today. EXAM: CT HEAD WITHOUT CONTRAST CT CERVICAL SPINE WITHOUT CONTRAST TECHNIQUE: Multidetector CT imaging of the head and cervical spine was performed following the standard protocol without intravenous contrast. Multiplanar CT image reconstructions of the cervical spine were also generated. COMPARISON:  03/31/2018 and prior CTs FINDINGS: CT HEAD FINDINGS Brain: No evidence of acute infarction, hemorrhage, hydrocephalus, extra-axial collection or mass lesion/mass effect. Atrophy and mild chronic small-vessel white matter ischemic changes again noted. Vascular: Atherosclerotic calcifications again identified. Skull: Normal. Negative for fracture or focal lesion. Sinuses/Orbits: Opacified RIGHT frontal, anterior ethmoid and maxillary sinuses noted. Other: High scalp soft tissue swelling noted without fracture. CT CERVICAL SPINE FINDINGS Alignment: Normal. Skull base and vertebrae: No acute fracture. T2 compression fracture is again noted. No primary bone lesion or focal pathologic process. Soft tissues and spinal canal: No prevertebral fluid or swelling. No visible canal hematoma. Disc levels: Mild to moderate multilevel degenerative disc disease, spondylosis and facet arthropathy noted. Upper chest: No acute abnormality Other: None IMPRESSION: 1. No evidence of acute intracranial abnormality. Atrophy and chronic small-vessel white matter ischemic changes. 2. No static evidence of acute injury to the cervical spine. 3. Scalp soft tissue swelling without acute fracture. 4. Remote T2 compression fracture 5. RIGHT paranasal sinusitis. Electronically Signed   By: Harmon Pier M.D.   On: 05/28/2018 19:44   Ct Cervical Spine Wo  Contrast  Result Date: 05/28/2018 CLINICAL DATA:  84 year old female with acute head and neck injury from fall today. EXAM: CT HEAD WITHOUT CONTRAST CT CERVICAL SPINE WITHOUT CONTRAST TECHNIQUE: Multidetector CT imaging of the head and cervical spine was performed following the standard protocol without intravenous contrast. Multiplanar CT image reconstructions of the cervical spine were also generated. COMPARISON:  03/31/2018 and prior CTs FINDINGS: CT HEAD FINDINGS Brain: No evidence of acute infarction, hemorrhage, hydrocephalus, extra-axial collection or mass lesion/mass effect. Atrophy and mild chronic small-vessel white matter ischemic changes again noted. Vascular: Atherosclerotic calcifications again identified. Skull: Normal. Negative for fracture or focal lesion. Sinuses/Orbits: Opacified RIGHT frontal, anterior ethmoid and maxillary sinuses noted. Other: High scalp soft tissue swelling noted without fracture. CT CERVICAL SPINE FINDINGS Alignment: Normal.  Skull base and vertebrae: No acute fracture. T2 compression fracture is again noted. No primary bone lesion or focal pathologic process. Soft tissues and spinal canal: No prevertebral fluid or swelling. No visible canal hematoma. Disc levels: Mild to moderate multilevel degenerative disc disease, spondylosis and facet arthropathy noted. Upper chest: No acute abnormality Other: None IMPRESSION: 1. No evidence of acute intracranial abnormality. Atrophy and chronic small-vessel white matter ischemic changes. 2. No static evidence of acute injury to the cervical spine. 3. Scalp soft tissue swelling without acute fracture. 4. Remote T2 compression fracture 5. RIGHT paranasal sinusitis. Electronically Signed   By: Harmon Pier M.D.   On: 05/28/2018 19:44    Procedures Procedures (including critical care time)  Medications Ordered in ED Medications - No data to display   Initial Impression / Assessment and Plan / ED Course  I have reviewed the triage  vital signs and the nursing notes.  Pertinent labs & imaging results that were available during my care of the patient were reviewed by me and considered in my medical decision making (see chart for details).     Patient is sent for crying out after found down from fall.  As documented above, patient is very conversant and can have long conversations about her past.  I have been able to do physical examination without precipitating pain complaints.  However, if the patient becomes agitated and movements are too brisker people approach her quickly trying to move her blankets or do other medical related things, she can become quite agitated crying out and complaining of pain.  X-rays of the axial skeleton do not show acute fractures.  At this time, I feel patient stable to return to nursing home care.  I recommend Tylenol for pain control, careful and attentive movements.  She should have recheck by treating facility physician within the next several days.  Final Clinical Impressions(s) / ED Diagnoses   Final diagnoses:  Fall, initial encounter  Generalized pain  Dementia with behavioral disturbance, unspecified dementia type [facility])    ED Discharge Orders    None       Arby Barrette, MD 05/30/18 1321

## 2018-05-30 NOTE — ED Notes (Signed)
Patient/caregiver verbalizes understanding of discharge instructions. Opportunity for questioning and answers were provided. Armband removed by staff, pt discharged from ED via [facility].

## 2018-05-30 NOTE — Discharge Instructions (Addendum)
1.  No focal area of pain was identified.  Patient was examined and there are no areas of bruising or localizing reproducible pain.  She was functional sitting up, eating and interacting.  She is able to stand to bear weight.  X-rays were taken of chest, lumbar spine and pelvis without any new findings identified. 2.  Give the patient Tylenol as needed for pain.  Assist in position as possible for comfort.  Have the facility physician reassess the patient in 2 to 3 days.

## 2018-05-30 NOTE — ED Notes (Signed)
Patient transported to X-ray 

## 2018-05-30 NOTE — ED Notes (Signed)
Attempted to ambulate pt with walker per MD request. Pt got to standing position with minimal assistance, and then refused to walk due to "pain in my left shoulder and then everywhere running down". Pt returned to bed and stated that she would be willing to try again later and that she is sorry she disappointed the staff. MD aware, will continue to monitor.

## 2018-05-30 NOTE — ED Triage Notes (Signed)
Pt arrives to ED from [facility] with complaints of two falls this morning and neck and bilateral hip pain. EMS reports pt was found by staff early this morning on the floor and "refused care" but was returned to bed. Pt found again screaming out on the floor a few hours later and EMS was called. Pt has dementia and is confused baseline. Pt placed in position of comfort with bed locked and lowered, call bell in reach.

## 2018-06-12 ENCOUNTER — Emergency Department (HOSPITAL_COMMUNITY)
Admission: EM | Admit: 2018-06-12 | Discharge: 2018-06-12 | Disposition: A | Discharge status: Home / Self Care | Payer: Medicare Other | Attending: Emergency Medicine | Admitting: Emergency Medicine

## 2018-06-12 ENCOUNTER — Emergency Department (HOSPITAL_COMMUNITY): Payer: Medicare Other

## 2018-06-12 ENCOUNTER — Other Ambulatory Visit: Payer: Self-pay

## 2018-06-12 ENCOUNTER — Encounter (HOSPITAL_COMMUNITY): Payer: Self-pay

## 2018-06-12 DIAGNOSIS — M25561 Pain in right knee: Secondary | ICD-10-CM | POA: Insufficient documentation

## 2018-06-12 DIAGNOSIS — S0990XA Unspecified injury of head, initial encounter: Secondary | ICD-10-CM

## 2018-06-12 DIAGNOSIS — M25551 Pain in right hip: Secondary | ICD-10-CM | POA: Insufficient documentation

## 2018-06-12 DIAGNOSIS — Z79899 Other long term (current) drug therapy: Secondary | ICD-10-CM | POA: Diagnosis not present

## 2018-06-12 DIAGNOSIS — F0390 Unspecified dementia without behavioral disturbance: Secondary | ICD-10-CM | POA: Diagnosis not present

## 2018-06-12 DIAGNOSIS — I110 Hypertensive heart disease with heart failure: Secondary | ICD-10-CM | POA: Insufficient documentation

## 2018-06-12 DIAGNOSIS — I509 Heart failure, unspecified: Secondary | ICD-10-CM | POA: Insufficient documentation

## 2018-06-12 DIAGNOSIS — Y939 Activity, unspecified: Secondary | ICD-10-CM | POA: Insufficient documentation

## 2018-06-12 DIAGNOSIS — Y92129 Unspecified place in nursing home as the place of occurrence of the external cause: Secondary | ICD-10-CM | POA: Diagnosis not present

## 2018-06-12 DIAGNOSIS — S0083XA Contusion of other part of head, initial encounter: Secondary | ICD-10-CM | POA: Diagnosis not present

## 2018-06-12 DIAGNOSIS — W1830XA Fall on same level, unspecified, initial encounter: Secondary | ICD-10-CM | POA: Diagnosis not present

## 2018-06-12 DIAGNOSIS — S0101XA Laceration without foreign body of scalp, initial encounter: Secondary | ICD-10-CM | POA: Diagnosis not present

## 2018-06-12 DIAGNOSIS — W19XXXA Unspecified fall, initial encounter: Secondary | ICD-10-CM

## 2018-06-12 DIAGNOSIS — S12391A Other nondisplaced fracture of fourth cervical vertebra, initial encounter for closed fracture: Secondary | ICD-10-CM | POA: Insufficient documentation

## 2018-06-12 DIAGNOSIS — Y999 Unspecified external cause status: Secondary | ICD-10-CM | POA: Insufficient documentation

## 2018-06-12 LAB — BASIC METABOLIC PANEL
Anion gap: 9 (ref 5–15)
BUN: 66 mg/dL — AB (ref 8–23)
CHLORIDE: 101 mmol/L (ref 98–111)
CO2: 31 mmol/L (ref 22–32)
Calcium: 8.9 mg/dL (ref 8.9–10.3)
Creatinine, Ser: 1.42 mg/dL — ABNORMAL HIGH (ref 0.44–1.00)
GFR calc Af Amer: 36 mL/min — ABNORMAL LOW (ref >60)
GFR calc non Af Amer: 31 mL/min — ABNORMAL LOW (ref >60)
Glucose, Bld: 100 mg/dL — ABNORMAL HIGH (ref 70–99)
POTASSIUM: 3.9 mmol/L (ref 3.5–5.1)
Sodium: 141 mmol/L (ref 135–145)

## 2018-06-12 LAB — CBC
HCT: 41.2 % (ref 36.0–46.0)
Hemoglobin: 12.9 g/dL (ref 12.0–15.0)
MCH: 31.4 pg (ref 26.0–34.0)
MCHC: 31.3 g/dL (ref 30.0–36.0)
MCV: 100.2 fL — ABNORMAL HIGH (ref 80.0–100.0)
NRBC: 0 % (ref 0.0–0.2)
Platelets: 225 10*3/uL (ref 150–400)
RBC: 4.11 MIL/uL (ref 3.87–5.11)
RDW: 12.9 % (ref 11.5–15.5)
WBC: 9.5 10*3/uL (ref 4.0–10.5)

## 2018-06-12 MED ORDER — FENTANYL CITRATE (PF) 100 MCG/2ML IJ SOLN
25.0000 ug | Freq: Once | INTRAMUSCULAR | Status: AC
  Administered 2018-06-12: 25 ug via INTRAVENOUS
  Filled 2018-06-12: qty 2

## 2018-06-12 NOTE — ED Provider Notes (Signed)
.  Suture Removal Date/Time: 06/12/2018 3:17 PM Performed by: Emi Holes, PA-C Authorized by: Emi Holes, PA-C   Consent:    Consent obtained:  Verbal   Consent given by:  Patient   Risks discussed:  Bleeding and pain   Alternatives discussed:  No treatment Location:    Location:  Head/neck   Head/neck location:  Scalp Procedure details:    Wound appearance:  No signs of infection, good wound healing and clean   Number of staples removed:  5 Post-procedure details:    Post-removal:  No dressing applied   Patient tolerance of procedure:  Tolerated well, no immediate complications .Marland KitchenLaceration Repair Date/Time: 06/12/2018 3:18 PM Performed by: Emi Holes, PA-C Authorized by: Emi Holes, PA-C   Consent:    Consent obtained:  Verbal   Consent given by:  Patient   Risks discussed:  Infection, pain and poor cosmetic result   Alternatives discussed:  No treatment Anesthesia (see MAR for exact dosages):    Anesthesia method:  None Laceration details:    Location:  Scalp   Scalp location:  L parietal   Length (cm):  2 Repair type:    Repair type:  Simple Pre-procedure details:    Preparation:  Imaging obtained to evaluate for foreign bodies and patient was prepped and draped in usual sterile fashion Exploration:    Hemostasis achieved with:  Direct pressure   Wound exploration: wound explored through full range of motion     Wound extent: no foreign bodies/material noted     Contaminated: no   Treatment:    Area cleansed with:  Saline   Amount of cleaning:  Standard   Irrigation solution:  Sterile saline   Irrigation volume:    Irrigation method:  Syringe   Visualized foreign bodies/material removed: no   Skin repair:    Repair method:  Staples   Number of staples:  3 Approximation:    Approximation:  Close Post-procedure details:    Dressing:  Open (no dressing)   Patient tolerance of procedure:  Tolerated well, no immediate  complications   Caprice Renshaw, PA-S assisted with both procedures under my supervision.   Emi Holes, PA-C 06/12/18 1521    Tegeler, Canary Brim, MD 06/12/18 1540

## 2018-06-12 NOTE — Consult Note (Signed)
Neurosurgery Consultation  Reason for Consult: Closed fracture of the cervical spine Referring Physician: Pollina  CC: Neck pain  HPI: This is a 84 y.o. woman that presents with after a fall with severe neck pain. She reportedly has dementia, she is unfortunately a poor historian and is unable to provide me with details of when or how she fell or how she has been feeling since. She currently endorses neck pain, denies weakness, numbness, or parasthesias.    ROS: A 14 point ROS was performed and is negative except as noted in the HPI but is limited by patient's dementia  PMHx:  Past Medical History:  Diagnosis Date  . Arthritis   . Atrial fibrillation (HCC)   . CHF (congestive heart failure) (HCC)   . Hypertension   . Pneumonia   . UTI (lower urinary tract infection)    FamHx: No family history on file. SocHx:  reports that she has never smoked. She has never used smokeless tobacco. She reports that she does not drink alcohol or use drugs.  Exam: Vital signs in last 24 hours: Temp:  [97.9 F (36.6 C)] 97.9 F (36.6 C) (10/23 0515) Pulse Rate:  [79-119] 85 (10/23 1215) Resp:  [16] 16 (10/23 0515) BP: (130-166)/(70-94) 166/92 (10/23 1215) SpO2:  [91 %-99 %] 99 % (10/23 1215) Weight:  [45.4 kg] 45.4 kg (10/23 0518) General: Awake, alert, cooperative, appears stated age and frail Head: normocephalic, +large left sided scalp ecchymosis HEENT: in well-fitting cervical collar, +diffusely increased tone in posterior cervical musculature  Pulmonary: breathing O2 by [place] comfortably, no evidence of increased work of breathing Cardiac: irregularly irregular Abdomen: S NT ND Extremities: warm and well perfused x4 Neuro: AOx1, PERRL, gaze neutral, FS Strength 5/5 x4, SILTx4 Pt spent much of the exam discussing her roommate, easily distracted during exam   Assessment and Plan: 84 y.o. woman s/p fall. 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 In comparison with her CT C-spine from 2 weeks ago, the fishmouthing of C4-5 has increased significantly. Given her DISH changes, this likely represents a fracture-dislocation of these two motion segments with instability. -given pt's age, dementia, and frailty, I do not think she would do well with surgical treatment of this fracture. I discussed this with the patient's family. I also explained to them that this represents an unstable fracture and that another injury like a fall could result in a spinal cord injury due to this instability. Unfortunately, a rigid cervical collar is the best treatment that we have, but I emphasized that this is unlikely to protect her completely. -okay for discharge from a neurosurgical perspective, pt should follow up with me in 6 weeks with repeat xrays to monitor the status of the fracture healing -Family asked regarding pain control. I advised the family that muscle relaxants can worsen mental status in the elderly, especially with dementia and that they could be used if needed. -please call with any concerns or questions  Jadene Pierini, MD 06/12/18 2:41 PM [facility]

## 2018-06-12 NOTE — ED Notes (Signed)
Patient transported to X-ray 

## 2018-06-12 NOTE — Discharge Instructions (Addendum)
Your imaging today showed evidence of a cervical spine fracture.  The neurosurgery team recommends discharge with cervical collar in place.  They request follow-up in 6 weeks.  Be careful not to have further falls given the instability of your neck fracture.  If any symptoms develop, change, or worsen, please return to the nearest emergency department.

## 2018-06-12 NOTE — ED Notes (Signed)
Reported off information to [facility]. Patient will be transported to [facility].

## 2018-06-12 NOTE — ED Notes (Signed)
Bed: WA21 Expected date:  Expected time:  Means of arrival:  Comments: EMS 84 yo female from SNF-unwitnessed fall/hematoma to head-no blood thinners

## 2018-06-12 NOTE — ED Triage Notes (Signed)
Pt brought by [facility] from [facility] due to an unwitnessed fall. Pt has a hematoma on the left side of her head. Denies LOC or blood thinners. Bleeding is controlled by dressing at this time. Pt also c/o hip bilateral hip pain.

## 2018-06-12 NOTE — ED Notes (Signed)
Patient cleaned up after arriving by EMS.

## 2018-06-12 NOTE — ED Provider Notes (Signed)
Patient arrived as a transfer from [place] long emerge department.  Patient had imaging after sustaining a fall overnight revealing evidence of a C4 cervical spine fracture.    Patient was transferred to see neurosurgery in the emergency department.    On my initial evaluation the patient, she is reporting severe pain in her neck and head.  Low-dose of fentanyl be ordered for pain.  Neurosurgery was called who will come see patient to determine disposition.   2:50 PM Neurosurgery assessed the patient and felt that she likely had other fractures that were not seen on initial CT.  They felt that due to patient's age and comorbidities she would not be a good candidate for surgery and thus they felt she was safe for discharge home for chronic cervical collar use.   Neurosurgery is going to call the patient's family and update them on the plan of care.  Patient's left wound will be cleaned.  Patient was found to have staples in the top of her head which will be removed.  Patient had staples placed in a new laceration on the left.  Patient will be discharged home for outpatient management of her cervical spine fracture.   Clinical Impression: 1. Other closed nondisplaced fracture of fourth cervical vertebra, initial encounter (HCC)   2. Fall, initial encounter   3. Injury of head, initial encounter     Disposition: Discharge  Condition: Stable  I have discussed the results, Dx and Tx plan with the pt(& family if present). He/she/they expressed understanding and agree(s) with the plan. Discharge instructions discussed at great length. Strict return precautions discussed and pt &/or family have verbalized understanding of the instructions. No further questions at time of discharge.    New Prescriptions   No medications on file    Follow Up: Jadene Pierini, MD [address] [place] [address] [phone]  In 6 weeks Please call to follow-up in 6 weeks with neurosurgery  team.  [facility] EMERGENCY DEPARTMENT [address] [id number] mc [place] [address] [phone]         Tegeler, Canary Brim, MD 06/12/18 2209

## 2018-06-12 NOTE — ED Provider Notes (Addendum)
[facility]-EMERGENCY DEPT Provider Note   CSN: [id number] Arrival date & time: 06/12/18  0507     History   Chief Complaint Chief Complaint  Patient presents with  . Fall    HPI Jennifer Bender is a 84 y.o. female.  Patient presents to the emergency department for evaluation after a fall.  Patient had an unwitnessed fall at nursing facility.  She was found on the ground, has a large contusion on the left side of her forehead.  Patient is only complaining of right knee pain.  She is unsure what happened, does not remember the fall.  She does have a history of dementia.     Past Medical History:  Diagnosis Date  . Arthritis   . Atrial fibrillation (HCC)   . CHF (congestive heart failure) (HCC)   . Hypertension   . Pneumonia   . UTI (lower urinary tract infection)     There are no active problems to display for this patient.   Past Surgical History:  Procedure Laterality Date  . ABDOMINAL HYSTERECTOMY    . BREAST REDUCTION SURGERY    . CARDIOVERSION    . HAND SURGERY       OB History   None      Home Medications    Prior to Admission medications   Medication Sig Start Date End Date Taking? Authorizing Provider  acetaminophen (TYLENOL) 325 MG tablet Take 650 mg by mouth 4 (four) times daily as needed for mild pain.    Yes [provider]  diltiazem (DILACOR XR) 180 MG 24 hr capsule Take 180 mg by mouth daily.   Yes [provider]  DULoxetine (CYMBALTA) 30 MG capsule Take 30 mg by mouth daily.   Yes [provider]  fish oil-omega-3 fatty acids 1000 MG capsule Take 1 g by mouth daily.    Yes [provider]  levothyroxine (SYNTHROID, LEVOTHROID) 88 MCG tablet Take 88 mcg by mouth daily before breakfast.   Yes [provider]  Menthol, Topical Analgesic, (ICE BLUE EX) Apply 1 application topically 3 (three) times daily.   Yes [provider]  Multiple Vitamin (MULITIVITAMIN WITH MINERALS)  TABS Take 1 tablet by mouth daily. Daily-vite   Yes [provider]  potassium chloride (K-DUR,KLOR-CON) 10 MEQ tablet Take 10 mEq by mouth daily.   Yes [provider]  Skin Protectants, Misc. (EUCERIN) cream Apply 1 application topically at bedtime.   Yes [provider]  torsemide (DEMADEX) 20 MG tablet Take 40 mg by mouth 2 (two) times daily.    Yes [provider]  Melatonin 5 MG TABS Take 5 mg by mouth at bedtime as needed (sleep).    [provider]    Family History No family history on file.  Social History Social History   Tobacco Use  . Smoking status: Never Smoker  . Smokeless tobacco: Never Used  Substance Use Topics  . Alcohol use: No  . Drug use: No     Allergies   Ace inhibitors; Colchicine; Crestor [rosuvastatin calcium]; Ezetimibe; Hydrocodone-acetaminophen; Lipitor [atorvastatin calcium]; Morphine and related; Nsaids; Pravachol; Rosuvastatin; Shellfish allergy; Tolmetin; Tramadol; and Zocor [simvastatin]   Review of Systems Review of Systems  Unable to perform ROS: Dementia     Physical Exam Updated Vital Signs BP (!) 156/94 (BP Location: Right Arm)   Pulse 79   Temp 97.9 F (36.6 C) (Oral)   Resp 16   Ht 5\' 3"  (1.6 m)   Wt  45.4 kg   SpO2 96%   BMI 17.73 kg/m   Physical Exam  Constitutional: She is oriented to person, place, and time. She appears well-developed and well-nourished. No distress.  HENT:  Head: Normocephalic. Head is with contusion (left forehead).  Right Ear: Hearing normal.  Left Ear: Hearing normal.  Nose: Nose normal.  Mouth/Throat: Oropharynx is clear and moist and mucous membranes are normal.  Eyes: Pupils are equal, round, and reactive to light. Conjunctivae and EOM are normal.  Neck: Normal range of motion. Neck supple.  Cardiovascular: Regular rhythm, S1 normal and S2 normal. Exam reveals no gallop and no friction rub.  No murmur heard. Pulmonary/Chest: Effort normal and  breath sounds normal. No respiratory distress. She exhibits no tenderness.  Abdominal: Soft. Normal appearance and bowel sounds are normal. There is no hepatosplenomegaly. There is no tenderness. There is no rebound, no guarding, no tenderness at McBurney's point and negative Murphy's sign. No hernia.  Musculoskeletal: Normal range of motion.       Right hip: She exhibits normal range of motion.       Left hip: She exhibits normal range of motion.       Right knee: She exhibits ecchymosis. She exhibits no deformity. Tenderness found.  Neurological: She is alert and oriented to person, place, and time. She has normal strength. No cranial nerve deficit or sensory deficit. Coordination normal. GCS eye subscore is 4. GCS verbal subscore is 5. GCS motor subscore is 6.  Skin: Skin is warm, dry and intact. No rash noted. No cyanosis.  Psychiatric: She has a normal mood and affect. Her speech is normal and behavior is normal. Thought content normal.  Nursing note and vitals reviewed.    ED Treatments / Results  Labs (all labs ordered are listed, but only abnormal results are displayed) Labs Reviewed  CBC - Abnormal; Notable for the following components:      Result Value   MCV 100.2 (*)    All other components within normal limits  BASIC METABOLIC PANEL - Abnormal; Notable for the following components:   Glucose, Bld 100 (*)    BUN 66 (*)    Creatinine, Ser 1.42 (*)    GFR calc non Af Amer 31 (*)    GFR calc Af Amer 36 (*)    All other components within normal limits    EKG None  Radiology Ct Head Wo Contrast  Result Date: 06/12/2018 CLINICAL DATA:  Pain following trauma EXAM: CT HEAD WITHOUT CONTRAST CT CERVICAL SPINE WITHOUT CONTRAST TECHNIQUE: Multidetector CT imaging of the head and cervical spine was performed following the standard protocol without intravenous contrast. Multiplanar CT image reconstructions of the cervical spine were also generated. COMPARISON:  CT head and CT  cervical spine May 28, 2018; CT cervical spine March 03, 2018 FINDINGS: CT HEAD FINDINGS Brain: Moderate diffuse atrophy is stable. There is no intracranial mass, hemorrhage, extra-axial fluid collection, or midline shift. There is patchy small vessel disease in the centra semiovale bilaterally. No acute infarct is appreciable. Vascular: There is no hyperdense vessel. There is calcification in each carotid siphon region. Skull: Bony calvarium appears intact. There is a sizable left frontal scalp hematoma. There is a smaller left temporal scalp hematoma. There are skin staples in the anterior frontal vertex region midline. Sinuses/Orbits: There is opacification of most of the visualized right maxillary antrum. There is opacification of multiple anterior ethmoid air cells on the right as well as the right frontal sinus. Orbits appear symmetric  bilaterally. Other: Mastoid air cells are clear. CT CERVICAL SPINE FINDINGS Alignment: There is 1 mm of anterolisthesis of C4 on C5. No other spondylolisthesis evident. Skull base and vertebrae: There is an acute fracture involving the posterior aspect of the right C4 lamina extending to the junction of the lamina and spinous process of C4 without appreciable displacement. Fracture does not extend into the canal in this region. No other acute fracture is demonstrable. There is chronic anterior wedging of the C5 and T2 vertebral bodies which appear stable. Bones are osteoporotic. There are no blastic or lytic bone lesions. Soft tissues and spinal canal: Prevertebral soft tissues and predental space regions are normal. There is no paraspinous lesion. There is no evident cord or canal hematoma. Disc levels: There is severe disc space narrowing at C5-6 and C6-7. There is facet hypertrophy at multiple levels bilaterally. There is moderate exit foraminal narrowing at C3-4 on the right, at C4-5 on the right, and at C5-6 bilaterally due to bony hypertrophy. There is no frank disc  extrusion or high-grade stenosis. Upper chest: Visualized upper lung zones are clear. Other: There is calcification in each carotid artery. IMPRESSION: CT head: Left-sided scalp hematomas, largest in the left frontal region. Skin staples anterior vertex region. No fracture. There is stable atrophy with periventricular small vessel disease. No acute infarct. No intracranial mass or hemorrhage. Multifocal paranasal sinus disease on the right. Foci of arterial vascular calcification noted. CT cervical spine: 1. Acute fracture involving the posterior aspect of the C4 lamina on the right extending to the junction of the C4 lamina and spinous process, essentially nondisplaced. No bony fragments extending into the canal. No other acute fracture. 2.  Stable prior wedge fractures at C5 and T2. 3.  Multilevel arthropathy. 4.  1 mm anterolisthesis of C4 on C5, likely due to spondylosis. 5.  Carotid artery calcification bilaterally. Critical Value/emergent results were called by telephone at the time of interpretation on 06/12/2018 at 7:21 am to Dr. Jaci Carrel , who verbally acknowledged these results. Electronically Signed   By: Bretta Bang III M.D.   On: 06/12/2018 07:21   Ct Cervical Spine Wo Contrast  Result Date: 06/12/2018 CLINICAL DATA:  Pain following trauma EXAM: CT HEAD WITHOUT CONTRAST CT CERVICAL SPINE WITHOUT CONTRAST TECHNIQUE: Multidetector CT imaging of the head and cervical spine was performed following the standard protocol without intravenous contrast. Multiplanar CT image reconstructions of the cervical spine were also generated. COMPARISON:  CT head and CT cervical spine May 28, 2018; CT cervical spine March 03, 2018 FINDINGS: CT HEAD FINDINGS Brain: Moderate diffuse atrophy is stable. There is no intracranial mass, hemorrhage, extra-axial fluid collection, or midline shift. There is patchy small vessel disease in the centra semiovale bilaterally. No acute infarct is appreciable.  Vascular: There is no hyperdense vessel. There is calcification in each carotid siphon region. Skull: Bony calvarium appears intact. There is a sizable left frontal scalp hematoma. There is a smaller left temporal scalp hematoma. There are skin staples in the anterior frontal vertex region midline. Sinuses/Orbits: There is opacification of most of the visualized right maxillary antrum. There is opacification of multiple anterior ethmoid air cells on the right as well as the right frontal sinus. Orbits appear symmetric bilaterally. Other: Mastoid air cells are clear. CT CERVICAL SPINE FINDINGS Alignment: There is 1 mm of anterolisthesis of C4 on C5. No other spondylolisthesis evident. Skull base and vertebrae: There is an acute fracture involving the posterior aspect of the right C4 lamina  extending to the junction of the lamina and spinous process of C4 without appreciable displacement. Fracture does not extend into the canal in this region. No other acute fracture is demonstrable. There is chronic anterior wedging of the C5 and T2 vertebral bodies which appear stable. Bones are osteoporotic. There are no blastic or lytic bone lesions. Soft tissues and spinal canal: Prevertebral soft tissues and predental space regions are normal. There is no paraspinous lesion. There is no evident cord or canal hematoma. Disc levels: There is severe disc space narrowing at C5-6 and C6-7. There is facet hypertrophy at multiple levels bilaterally. There is moderate exit foraminal narrowing at C3-4 on the right, at C4-5 on the right, and at C5-6 bilaterally due to bony hypertrophy. There is no frank disc extrusion or high-grade stenosis. Upper chest: Visualized upper lung zones are clear. Other: There is calcification in each carotid artery. IMPRESSION: CT head: Left-sided scalp hematomas, largest in the left frontal region. Skin staples anterior vertex region. No fracture. There is stable atrophy with periventricular small vessel  disease. No acute infarct. No intracranial mass or hemorrhage. Multifocal paranasal sinus disease on the right. Foci of arterial vascular calcification noted. CT cervical spine: 1. Acute fracture involving the posterior aspect of the C4 lamina on the right extending to the junction of the C4 lamina and spinous process, essentially nondisplaced. No bony fragments extending into the canal. No other acute fracture. 2.  Stable prior wedge fractures at C5 and T2. 3.  Multilevel arthropathy. 4.  1 mm anterolisthesis of C4 on C5, likely due to spondylosis. 5.  Carotid artery calcification bilaterally. Critical Value/emergent results were called by telephone at the time of interpretation on 06/12/2018 at 7:21 am to Dr. Jaci Carrel , who verbally acknowledged these results. Electronically Signed   By: Bretta Bang III M.D.   On: 06/12/2018 07:21   Dg Knee Complete 4 Views Right  Result Date: 06/12/2018 CLINICAL DATA:  Fall tonight with pain. EXAM: RIGHT KNEE - COMPLETE 4+ VIEW COMPARISON:  Radiograph 02/20/2018 FINDINGS: Remote distal femur fracture with lateral plate and screw fixation. No acute or periprosthetic fracture. Proximal aspect of the hardware is not entirely included in the field of view. Osteoarthritis with tibiofemoral joint space narrowing and peripheral spurring, medial greater than lateral. No large joint effusion, hardware partially obscures evaluation. The bones are under mineralized. Advanced vascular calcifications. IMPRESSION: Postsurgical and degenerative change of the right knee without acute fracture. Electronically Signed   By: Narda Rutherford M.D.   On: 06/12/2018 06:50   Dg Hip Unilat W Or Wo Pelvis 2-3 Views Right  Result Date: 06/12/2018 CLINICAL DATA:  Fall tonight with right hip pain. EXAM: DG HIP (WITH OR WITHOUT PELVIS) 2-3V RIGHT COMPARISON:  Pelvis and right hip radiographs 02/27/2018 FINDINGS: The cortical margins of the bony pelvis and right hip are intact. No  fracture. The bones are under mineralized. Pubic symphysis and sacroiliac joints are congruent. Both femoral heads are well-seated in the respective acetabula. Lateral plate and screw in the mid femoral shaft partially included. Advanced vascular calcifications. Stool distends the rectum. IMPRESSION: No fracture of the pelvis or right hip. Electronically Signed   By: Narda Rutherford M.D.   On: 06/12/2018 06:48    Procedures Procedures (including critical care time)  Medications Ordered in ED Medications - No data to display   Initial Impression / Assessment and Plan / ED Course  I have reviewed the triage vital signs and the nursing notes.  Pertinent labs &  imaging results that were available during my care of the patient were reviewed by me and considered in my medical decision making (see chart for details).     Patient presents to the emergency department for evaluation after a fall.  Patient had unwitnessed fall, noted to have a large contusion on her right forehead.  Patient has dementia at baseline, cannot provide any further information.  Patient therefore underwent CT head and cervical spine for further evaluation.  Head CT did not show any acute injury, patient does, however, have evidence of C4 fracture.  Discussed with neurosurgery on-call, recommend transfer to [facility] emergency department for further evaluation.  I did contact the patient's daughter, [name] [phone]) and inform her of the patient's injury and the fact that she was being transferred to [facility].  I asked her to have family go to [facility] so neurosurgery can further discuss this injury and treatment options.  Final Clinical Impressions(s) / ED Diagnoses   Final diagnoses:  Other closed nondisplaced fracture of fourth cervical vertebra, initial encounter [facility])    ED Discharge Orders    None       Gilda Crease, MD 06/12/18 [id number]    Gilda Crease, MD 06/12/18  [phone]

## 2018-06-13 ENCOUNTER — Emergency Department (HOSPITAL_COMMUNITY): Payer: Medicare Other

## 2018-06-13 ENCOUNTER — Observation Stay (HOSPITAL_COMMUNITY)
Admission: EM | Admit: 2018-06-13 | Discharge: 2018-06-19 | Disposition: A | Discharge status: Skilled Nursing Facility | Payer: Medicare Other | Attending: Internal Medicine | Admitting: Internal Medicine

## 2018-06-13 ENCOUNTER — Other Ambulatory Visit: Payer: Self-pay

## 2018-06-13 DIAGNOSIS — R062 Wheezing: Secondary | ICD-10-CM | POA: Insufficient documentation

## 2018-06-13 DIAGNOSIS — S12300A Unspecified displaced fracture of fourth cervical vertebra, initial encounter for closed fracture: Secondary | ICD-10-CM

## 2018-06-13 DIAGNOSIS — Z888 Allergy status to other drugs, medicaments and biological substances status: Secondary | ICD-10-CM | POA: Insufficient documentation

## 2018-06-13 DIAGNOSIS — I420 Dilated cardiomyopathy: Secondary | ICD-10-CM | POA: Diagnosis not present

## 2018-06-13 DIAGNOSIS — I700 Atherosclerosis of aorta: Secondary | ICD-10-CM | POA: Insufficient documentation

## 2018-06-13 DIAGNOSIS — Z886 Allergy status to analgesic agent status: Secondary | ICD-10-CM | POA: Insufficient documentation

## 2018-06-13 DIAGNOSIS — R296 Repeated falls: Secondary | ICD-10-CM | POA: Diagnosis not present

## 2018-06-13 DIAGNOSIS — Z79899 Other long term (current) drug therapy: Secondary | ICD-10-CM | POA: Insufficient documentation

## 2018-06-13 DIAGNOSIS — I615 Nontraumatic intracerebral hemorrhage, intraventricular: Secondary | ICD-10-CM | POA: Diagnosis not present

## 2018-06-13 DIAGNOSIS — I6782 Cerebral ischemia: Secondary | ICD-10-CM | POA: Insufficient documentation

## 2018-06-13 DIAGNOSIS — D649 Anemia, unspecified: Secondary | ICD-10-CM | POA: Insufficient documentation

## 2018-06-13 DIAGNOSIS — Z993 Dependence on wheelchair: Secondary | ICD-10-CM | POA: Insufficient documentation

## 2018-06-13 DIAGNOSIS — R55 Syncope and collapse: Secondary | ICD-10-CM | POA: Diagnosis not present

## 2018-06-13 DIAGNOSIS — R140 Abdominal distension (gaseous): Secondary | ICD-10-CM | POA: Diagnosis not present

## 2018-06-13 DIAGNOSIS — J320 Chronic maxillary sinusitis: Secondary | ICD-10-CM | POA: Insufficient documentation

## 2018-06-13 DIAGNOSIS — G319 Degenerative disease of nervous system, unspecified: Secondary | ICD-10-CM | POA: Diagnosis not present

## 2018-06-13 DIAGNOSIS — R9082 White matter disease, unspecified: Secondary | ICD-10-CM | POA: Diagnosis not present

## 2018-06-13 DIAGNOSIS — I4891 Unspecified atrial fibrillation: Secondary | ICD-10-CM

## 2018-06-13 DIAGNOSIS — S12391A Other nondisplaced fracture of fourth cervical vertebra, initial encounter for closed fracture: Secondary | ICD-10-CM

## 2018-06-13 DIAGNOSIS — M1990 Unspecified osteoarthritis, unspecified site: Secondary | ICD-10-CM | POA: Insufficient documentation

## 2018-06-13 DIAGNOSIS — J322 Chronic ethmoidal sinusitis: Secondary | ICD-10-CM | POA: Diagnosis not present

## 2018-06-13 DIAGNOSIS — I081 Rheumatic disorders of both mitral and tricuspid valves: Secondary | ICD-10-CM | POA: Diagnosis not present

## 2018-06-13 DIAGNOSIS — Z79890 Hormone replacement therapy (postmenopausal): Secondary | ICD-10-CM | POA: Insufficient documentation

## 2018-06-13 DIAGNOSIS — Z681 Body mass index (BMI) 19 or less, adult: Secondary | ICD-10-CM | POA: Diagnosis not present

## 2018-06-13 DIAGNOSIS — Z87440 Personal history of urinary (tract) infections: Secondary | ICD-10-CM | POA: Insufficient documentation

## 2018-06-13 DIAGNOSIS — I509 Heart failure, unspecified: Secondary | ICD-10-CM | POA: Diagnosis not present

## 2018-06-13 DIAGNOSIS — Z885 Allergy status to narcotic agent status: Secondary | ICD-10-CM | POA: Insufficient documentation

## 2018-06-13 DIAGNOSIS — Z9889 Other specified postprocedural states: Secondary | ICD-10-CM | POA: Insufficient documentation

## 2018-06-13 DIAGNOSIS — E440 Moderate protein-calorie malnutrition: Secondary | ICD-10-CM | POA: Diagnosis not present

## 2018-06-13 DIAGNOSIS — E039 Hypothyroidism, unspecified: Secondary | ICD-10-CM | POA: Diagnosis not present

## 2018-06-13 DIAGNOSIS — N184 Chronic kidney disease, stage 4 (severe): Secondary | ICD-10-CM

## 2018-06-13 DIAGNOSIS — I130 Hypertensive heart and chronic kidney disease with heart failure and stage 1 through stage 4 chronic kidney disease, or unspecified chronic kidney disease: Secondary | ICD-10-CM | POA: Diagnosis not present

## 2018-06-13 DIAGNOSIS — Z66 Do not resuscitate: Secondary | ICD-10-CM | POA: Insufficient documentation

## 2018-06-13 DIAGNOSIS — G9340 Encephalopathy, unspecified: Secondary | ICD-10-CM | POA: Diagnosis not present

## 2018-06-13 DIAGNOSIS — S0003XA Contusion of scalp, initial encounter: Secondary | ICD-10-CM

## 2018-06-13 DIAGNOSIS — R0989 Other specified symptoms and signs involving the circulatory and respiratory systems: Secondary | ICD-10-CM

## 2018-06-13 DIAGNOSIS — I10 Essential (primary) hypertension: Secondary | ICD-10-CM

## 2018-06-13 DIAGNOSIS — Z90710 Acquired absence of both cervix and uterus: Secondary | ICD-10-CM | POA: Insufficient documentation

## 2018-06-13 LAB — CBC WITH DIFFERENTIAL/PLATELET
Abs Immature Granulocytes: 0.01 10*3/uL (ref 0.00–0.07)
BASOS ABS: 0.1 10*3/uL (ref 0.0–0.1)
BASOS PCT: 1 %
EOS PCT: 1 %
Eosinophils Absolute: 0.1 10*3/uL (ref 0.0–0.5)
HCT: 47.2 % — ABNORMAL HIGH (ref 36.0–46.0)
HEMOGLOBIN: 14.3 g/dL (ref 12.0–15.0)
Immature Granulocytes: 0 %
LYMPHS PCT: 12 %
Lymphs Abs: 0.8 10*3/uL (ref 0.7–4.0)
MCH: 30.4 pg (ref 26.0–34.0)
MCHC: 30.3 g/dL (ref 30.0–36.0)
MCV: 100.4 fL — ABNORMAL HIGH (ref 80.0–100.0)
Monocytes Absolute: 0.8 10*3/uL (ref 0.1–1.0)
Monocytes Relative: 12 %
NEUTROS ABS: 5.3 10*3/uL (ref 1.7–7.7)
NRBC: 0 % (ref 0.0–0.2)
Neutrophils Relative %: 74 %
PLATELETS: 177 10*3/uL (ref 150–400)
RBC: 4.7 MIL/uL (ref 3.87–5.11)
RDW: 12.7 % (ref 11.5–15.5)
WBC: 7 10*3/uL (ref 4.0–10.5)

## 2018-06-13 LAB — BASIC METABOLIC PANEL
Anion gap: 10 (ref 5–15)
BUN: 53 mg/dL — ABNORMAL HIGH (ref 8–23)
CHLORIDE: 107 mmol/L (ref 98–111)
CO2: 27 mmol/L (ref 22–32)
CREATININE: 1.55 mg/dL — AB (ref 0.44–1.00)
Calcium: 8.6 mg/dL — ABNORMAL LOW (ref 8.9–10.3)
GFR calc non Af Amer: 28 mL/min — ABNORMAL LOW (ref >60)
GFR, EST AFRICAN AMERICAN: 32 mL/min — AB (ref >60)
Glucose, Bld: 121 mg/dL — ABNORMAL HIGH (ref 70–99)
POTASSIUM: 3.9 mmol/L (ref 3.5–5.1)
Sodium: 144 mmol/L (ref 135–145)

## 2018-06-13 LAB — URINALYSIS, ROUTINE W REFLEX MICROSCOPIC
BILIRUBIN URINE: NEGATIVE
Glucose, UA: NEGATIVE mg/dL
HGB URINE DIPSTICK: NEGATIVE
KETONES UR: NEGATIVE mg/dL
Leukocytes, UA: NEGATIVE
Nitrite: NEGATIVE
PROTEIN: NEGATIVE mg/dL
Specific Gravity, Urine: 1.013 (ref 1.005–1.030)
pH: 5 (ref 5.0–8.0)

## 2018-06-13 LAB — CBG MONITORING, ED: Glucose-Capillary: 116 mg/dL — ABNORMAL HIGH (ref 70–99)

## 2018-06-13 LAB — TROPONIN I: TROPONIN I: 0.03 ng/mL — AB (ref <0.03)

## 2018-06-13 MED ORDER — LORAZEPAM 2 MG/ML IJ SOLN
0.5000 mg | Freq: Once | INTRAMUSCULAR | Status: AC
  Administered 2018-06-13: 0.5 mg via INTRAVENOUS
  Filled 2018-06-13: qty 1

## 2018-06-13 MED ORDER — SODIUM CHLORIDE 0.9 % IV BOLUS
1000.0000 mL | Freq: Once | INTRAVENOUS | Status: AC
  Administered 2018-06-13: 1000 mL via INTRAVENOUS

## 2018-06-13 MED ORDER — SODIUM CHLORIDE 0.9 % IV SOLN
INTRAVENOUS | Status: DC
  Administered 2018-06-13 – 2018-06-14 (×4): via INTRAVENOUS

## 2018-06-13 NOTE — ED Provider Notes (Signed)
[facility] EMERGENCY DEPARTMENT Provider Note   CSN: [id number] Arrival date & time: 06/13/18  1650     History   Chief Complaint Chief Complaint  Patient presents with  . Altered Mental Status    HPI Jennifer Bender is a 84 y.o. female.  Pt presents to the ED today with a syncopal event.  The pt was in her wheelchair at [facility] place and went unresponsive in her wheelchair.  The staff lowered her to the ground and were about the start CPR when she started responding.  The pt was here yesterday after sustaining a fall where she struck her head and had a nondisplaced C4 lamina and spinous process fx.  She was d/c back to facility with an Aspen collar.  Pt denies any pain now.  Pt does have a hx of afib, but is not on any blood thinners.  She does have a pacemaker, so we will interrogate that.     Past Medical History:  Diagnosis Date  . Arthritis   . Atrial fibrillation (HCC)   . CHF (congestive heart failure) (HCC)   . Hypertension   . Pneumonia   . UTI (lower urinary tract infection)     Patient Active Problem List   Diagnosis Date Noted  . Syncope 06/13/2018    Past Surgical History:  Procedure Laterality Date  . ABDOMINAL HYSTERECTOMY    . BREAST REDUCTION SURGERY    . CARDIOVERSION    . HAND SURGERY       OB History   None      Home Medications    Prior to Admission medications   Medication Sig Start Date End Date Taking? Authorizing Provider  acetaminophen (TYLENOL) 325 MG tablet Take 650 mg by mouth 4 (four) times daily as needed for mild pain.     [provider]  diltiazem (DILACOR XR) 180 MG 24 hr capsule Take 180 mg by mouth daily.    [provider]  DULoxetine (CYMBALTA) 30 MG capsule Take 30 mg by mouth daily.    [provider]  fish oil-omega-3 fatty acids 1000 MG capsule Take 1 g by mouth daily.     [provider]  levothyroxine (SYNTHROID, LEVOTHROID) 88 MCG tablet Take 88 mcg by mouth  daily before breakfast.    [provider]  Melatonin 5 MG TABS Take 5 mg by mouth at bedtime as needed (sleep).    [provider]  Menthol, Topical Analgesic, (ICE BLUE EX) Apply 1 application topically 3 (three) times daily.    [provider]  Multiple Vitamin (MULITIVITAMIN WITH MINERALS) TABS Take 1 tablet by mouth daily. Daily-vite    [provider]  potassium chloride (K-DUR,KLOR-CON) 10 MEQ tablet Take 10 mEq by mouth daily.    [provider]  Skin Protectants, Misc. (EUCERIN) cream Apply 1 application topically at bedtime.    [provider]  torsemide (DEMADEX) 20 MG tablet Take 40 mg by mouth 2 (two) times daily.     [provider]    Family History No family history on file.  Social History Social History   Tobacco Use  . Smoking status: Never Smoker  . Smokeless tobacco: Never Used  Substance Use Topics  . Alcohol use: No  . Drug use: No     Allergies   Ace inhibitors; Colchicine; Crestor [rosuvastatin calcium]; Ezetimibe; Hydrocodone-acetaminophen; Lipitor [atorvastatin calcium]; Morphine and related; Nsaids; Pravachol; Rosuvastatin; Shellfish allergy; Tolmetin; Tramadol; and Zocor [simvastatin]   Review  of Systems Review of Systems  Neurological: Positive for syncope.  All other systems reviewed and are negative.    Physical Exam Updated Vital Signs BP 137/81   Pulse 87   Temp (!) 97.5 F (36.4 C) (Oral)   Resp 20   SpO2 (!) 88%   Physical Exam  Constitutional: She appears well-developed and well-nourished.  HENT:  Right Ear: External ear normal.  Left Ear: External ear normal.  Nose: Nose normal.  Mouth/Throat: Oropharynx is clear and moist.  Large hematoma and healing laceration to left forehead  Eyes: Pupils are equal, round, and reactive to light. Conjunctivae and EOM are normal.  Neck: Normal range of motion. Neck supple.  Cardiovascular: Normal rate, normal heart sounds and  intact distal pulses. An irregularly irregular rhythm present.  Pulmonary/Chest: Effort normal and breath sounds normal.  Abdominal: Soft. Bowel sounds are normal.  Musculoskeletal: Normal range of motion.  Neurological: She is alert.  Skin: Skin is warm. Capillary refill takes less than 2 seconds.  Psychiatric: She has a normal mood and affect. Her behavior is normal. Judgment and thought content normal.  Nursing note and vitals reviewed.    ED Treatments / Results  Labs (all labs ordered are listed, but only abnormal results are displayed) Labs Reviewed  BASIC METABOLIC PANEL - Abnormal; Notable for the following components:      Result Value   Glucose, Bld 121 (*)    BUN 53 (*)    Creatinine, Ser 1.55 (*)    Calcium 8.6 (*)    GFR calc non Af Amer 28 (*)    GFR calc Af Amer 32 (*)    All other components within normal limits  CBC WITH DIFFERENTIAL/PLATELET - Abnormal; Notable for the following components:   HCT 47.2 (*)    MCV 100.4 (*)    All other components within normal limits  TROPONIN I - Abnormal; Notable for the following components:   Troponin I 0.03 (*)    All other components within normal limits  CBG MONITORING, ED - Abnormal; Notable for the following components:   Glucose-Capillary 116 (*)    All other components within normal limits  URINALYSIS, ROUTINE W REFLEX MICROSCOPIC    EKG EKG Interpretation  Date/Time:  Thursday June 13 2018 17:28:45 EDT Ventricular Rate:  73 PR Interval:    QRS Duration: 141 QT Interval:  434 QTC Calculation: 479 R Axis:   -69 Text Interpretation:  Atrial fibrillation RBBB and LAFB No significant change since last tracing Confirmed by Jacalyn Lefevre [phone]) on 06/13/2018 5:32:57 PM   Radiology Dg Chest 2 View  Result Date: 06/13/2018 CLINICAL DATA:  Syncope EXAM: CHEST - 2 VIEW COMPARISON:  05/30/2018 FINDINGS: Cardiomegaly. Left pacer remains in place, unchanged. No confluent airspace opacities or effusions. No  acute bony abnormality. Severe compression fracture noted in the lower thoracic spine, stable. Old right femoral neck fracture. IMPRESSION: Cardiomegaly.  No active cardiopulmonary disease. Electronically Signed   By: Charlett Nose M.D.   On: 06/13/2018 19:33   Ct Head Wo Contrast  Result Date: 06/13/2018 CLINICAL DATA:  Fall, altered level of consciousness EXAM: CT HEAD WITHOUT CONTRAST TECHNIQUE: Contiguous axial images were obtained from the base of the skull through the vertex without intravenous contrast. COMPARISON:  06/12/2018 FINDINGS: Brain: New since prior studies a small amount of layering blood in the posterior horn of the left lateral ventricle. No intraparenchymal hemorrhage. Advanced atrophy and chronic small vessel disease. Associated ventriculomegaly, stable. Vascular: No hyperdense vessel or  unexpected calcification. Skull: No acute calvarial abnormality. Sinuses/Orbits: Complete opacification of the right frontal sinus, anterior ethmoid air cells, and right maxillary sinus. Other: Left scalp hematoma again noted, unchanged. IMPRESSION: New finding since prior study is a small amount of layering blood in the posterior horn of the left lateral ventricle. Atrophy, chronic small vessel disease. Electronically Signed   By: Charlett Nose M.D.   On: 06/13/2018 19:26   Ct Head Wo Contrast  Result Date: 06/12/2018 CLINICAL DATA:  Pain following trauma EXAM: CT HEAD WITHOUT CONTRAST CT CERVICAL SPINE WITHOUT CONTRAST TECHNIQUE: Multidetector CT imaging of the head and cervical spine was performed following the standard protocol without intravenous contrast. Multiplanar CT image reconstructions of the cervical spine were also generated. COMPARISON:  CT head and CT cervical spine May 28, 2018; CT cervical spine March 03, 2018 FINDINGS: CT HEAD FINDINGS Brain: Moderate diffuse atrophy is stable. There is no intracranial mass, hemorrhage, extra-axial fluid collection, or midline shift. There is patchy  small vessel disease in the centra semiovale bilaterally. No acute infarct is appreciable. Vascular: There is no hyperdense vessel. There is calcification in each carotid siphon region. Skull: Bony calvarium appears intact. There is a sizable left frontal scalp hematoma. There is a smaller left temporal scalp hematoma. There are skin staples in the anterior frontal vertex region midline. Sinuses/Orbits: There is opacification of most of the visualized right maxillary antrum. There is opacification of multiple anterior ethmoid air cells on the right as well as the right frontal sinus. Orbits appear symmetric bilaterally. Other: Mastoid air cells are clear. CT CERVICAL SPINE FINDINGS Alignment: There is 1 mm of anterolisthesis of C4 on C5. No other spondylolisthesis evident. Skull base and vertebrae: There is an acute fracture involving the posterior aspect of the right C4 lamina extending to the junction of the lamina and spinous process of C4 without appreciable displacement. Fracture does not extend into the canal in this region. No other acute fracture is demonstrable. There is chronic anterior wedging of the C5 and T2 vertebral bodies which appear stable. Bones are osteoporotic. There are no blastic or lytic bone lesions. Soft tissues and spinal canal: Prevertebral soft tissues and predental space regions are normal. There is no paraspinous lesion. There is no evident cord or canal hematoma. Disc levels: There is severe disc space narrowing at C5-6 and C6-7. There is facet hypertrophy at multiple levels bilaterally. There is moderate exit foraminal narrowing at C3-4 on the right, at C4-5 on the right, and at C5-6 bilaterally due to bony hypertrophy. There is no frank disc extrusion or high-grade stenosis. Upper chest: Visualized upper lung zones are clear. Other: There is calcification in each carotid artery. IMPRESSION: CT head: Left-sided scalp hematomas, largest in the left frontal region. Skin staples  anterior vertex region. No fracture. There is stable atrophy with periventricular small vessel disease. No acute infarct. No intracranial mass or hemorrhage. Multifocal paranasal sinus disease on the right. Foci of arterial vascular calcification noted. CT cervical spine: 1. Acute fracture involving the posterior aspect of the C4 lamina on the right extending to the junction of the C4 lamina and spinous process, essentially nondisplaced. No bony fragments extending into the canal. No other acute fracture. 2.  Stable prior wedge fractures at C5 and T2. 3.  Multilevel arthropathy. 4.  1 mm anterolisthesis of C4 on C5, likely due to spondylosis. 5.  Carotid artery calcification bilaterally. Critical Value/emergent results were called by telephone at the time of interpretation on 06/12/2018 at 7:21  am to Dr. Jaci Carrel , who verbally acknowledged these results. Electronically Signed   By: Bretta Bang III M.D.   On: 06/12/2018 07:21   Ct Cervical Spine Wo Contrast  Result Date: 06/12/2018 CLINICAL DATA:  Pain following trauma EXAM: CT HEAD WITHOUT CONTRAST CT CERVICAL SPINE WITHOUT CONTRAST TECHNIQUE: Multidetector CT imaging of the head and cervical spine was performed following the standard protocol without intravenous contrast. Multiplanar CT image reconstructions of the cervical spine were also generated. COMPARISON:  CT head and CT cervical spine May 28, 2018; CT cervical spine March 03, 2018 FINDINGS: CT HEAD FINDINGS Brain: Moderate diffuse atrophy is stable. There is no intracranial mass, hemorrhage, extra-axial fluid collection, or midline shift. There is patchy small vessel disease in the centra semiovale bilaterally. No acute infarct is appreciable. Vascular: There is no hyperdense vessel. There is calcification in each carotid siphon region. Skull: Bony calvarium appears intact. There is a sizable left frontal scalp hematoma. There is a smaller left temporal scalp hematoma. There are  skin staples in the anterior frontal vertex region midline. Sinuses/Orbits: There is opacification of most of the visualized right maxillary antrum. There is opacification of multiple anterior ethmoid air cells on the right as well as the right frontal sinus. Orbits appear symmetric bilaterally. Other: Mastoid air cells are clear. CT CERVICAL SPINE FINDINGS Alignment: There is 1 mm of anterolisthesis of C4 on C5. No other spondylolisthesis evident. Skull base and vertebrae: There is an acute fracture involving the posterior aspect of the right C4 lamina extending to the junction of the lamina and spinous process of C4 without appreciable displacement. Fracture does not extend into the canal in this region. No other acute fracture is demonstrable. There is chronic anterior wedging of the C5 and T2 vertebral bodies which appear stable. Bones are osteoporotic. There are no blastic or lytic bone lesions. Soft tissues and spinal canal: Prevertebral soft tissues and predental space regions are normal. There is no paraspinous lesion. There is no evident cord or canal hematoma. Disc levels: There is severe disc space narrowing at C5-6 and C6-7. There is facet hypertrophy at multiple levels bilaterally. There is moderate exit foraminal narrowing at C3-4 on the right, at C4-5 on the right, and at C5-6 bilaterally due to bony hypertrophy. There is no frank disc extrusion or high-grade stenosis. Upper chest: Visualized upper lung zones are clear. Other: There is calcification in each carotid artery. IMPRESSION: CT head: Left-sided scalp hematomas, largest in the left frontal region. Skin staples anterior vertex region. No fracture. There is stable atrophy with periventricular small vessel disease. No acute infarct. No intracranial mass or hemorrhage. Multifocal paranasal sinus disease on the right. Foci of arterial vascular calcification noted. CT cervical spine: 1. Acute fracture involving the posterior aspect of the C4 lamina  on the right extending to the junction of the C4 lamina and spinous process, essentially nondisplaced. No bony fragments extending into the canal. No other acute fracture. 2.  Stable prior wedge fractures at C5 and T2. 3.  Multilevel arthropathy. 4.  1 mm anterolisthesis of C4 on C5, likely due to spondylosis. 5.  Carotid artery calcification bilaterally. Critical Value/emergent results were called by telephone at the time of interpretation on 06/12/2018 at 7:21 am to Dr. Jaci Carrel , who verbally acknowledged these results. Electronically Signed   By: Bretta Bang III M.D.   On: 06/12/2018 07:21   Dg Knee Complete 4 Views Right  Result Date: 06/12/2018 CLINICAL DATA:  Fall tonight with  pain. EXAM: RIGHT KNEE - COMPLETE 4+ VIEW COMPARISON:  Radiograph 02/20/2018 FINDINGS: Remote distal femur fracture with lateral plate and screw fixation. No acute or periprosthetic fracture. Proximal aspect of the hardware is not entirely included in the field of view. Osteoarthritis with tibiofemoral joint space narrowing and peripheral spurring, medial greater than lateral. No large joint effusion, hardware partially obscures evaluation. The bones are under mineralized. Advanced vascular calcifications. IMPRESSION: Postsurgical and degenerative change of the right knee without acute fracture. Electronically Signed   By: Narda Rutherford M.D.   On: 06/12/2018 06:50   Dg Hip Unilat W Or Wo Pelvis 2-3 Views Right  Result Date: 06/12/2018 CLINICAL DATA:  Fall tonight with right hip pain. EXAM: DG HIP (WITH OR WITHOUT PELVIS) 2-3V RIGHT COMPARISON:  Pelvis and right hip radiographs 02/27/2018 FINDINGS: The cortical margins of the bony pelvis and right hip are intact. No fracture. The bones are under mineralized. Pubic symphysis and sacroiliac joints are congruent. Both femoral heads are well-seated in the respective acetabula. Lateral plate and screw in the mid femoral shaft partially included. Advanced vascular  calcifications. Stool distends the rectum. IMPRESSION: No fracture of the pelvis or right hip. Electronically Signed   By: Narda Rutherford M.D.   On: 06/12/2018 06:48    Procedures Procedures (including critical care time)  Medications Ordered in ED Medications  sodium chloride 0.9 % bolus 1,000 mL (0 mLs Intravenous Stopped 06/13/18 1925)    And  0.9 %  sodium chloride infusion ( Intravenous New Bag/Given 06/13/18 2211)  LORazepam (ATIVAN) injection 0.5 mg (0.5 mg Intravenous Given 06/13/18 2212)     Initial Impression / Assessment and Plan / ED Course  I have reviewed the triage vital signs and the nursing notes.  Pertinent labs & imaging results that were available during my care of the patient were reviewed by me and considered in my medical decision making (see chart for details).    Pt has a pacemaker, but unfortunately, we don't know what kind it is.  There is nothing in the records on Epic that I can find.  Family members do not know either.  They will call the cardiologist in the morning and ask them.  The pt was d/w Dr. Loney Loh (triad) for admission.  Pt will need an admission for observation for syncope.  In the morning, once we find out what kind of pacemaker she has, we can interrogate.  Final Clinical Impressions(s) / ED Diagnoses   Final diagnoses:  Syncope, unspecified syncope type  Other closed nondisplaced fracture of fourth cervical vertebra, initial encounter (HCC)  IVH (intraventricular hemorrhage) (HCC)  Contusion of scalp, initial encounter    ED Discharge Orders    None       Jacalyn Lefevre, MD 06/13/18 2259
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
## 2018-06-13 NOTE — ED Notes (Signed)
Pt given sandwich and milk at pts request

## 2018-06-13 NOTE — ED Notes (Signed)
Patient transported to X-ray 

## 2018-06-14 ENCOUNTER — Encounter (HOSPITAL_COMMUNITY): Payer: Self-pay | Admitting: *Deleted

## 2018-06-14 ENCOUNTER — Observation Stay (HOSPITAL_COMMUNITY): Payer: Medicare Other

## 2018-06-14 ENCOUNTER — Ambulatory Visit (HOSPITAL_COMMUNITY): Payer: Medicare Other

## 2018-06-14 ENCOUNTER — Observation Stay (HOSPITAL_BASED_OUTPATIENT_CLINIC_OR_DEPARTMENT_OTHER): Payer: Medicare Other

## 2018-06-14 ENCOUNTER — Other Ambulatory Visit: Payer: Self-pay

## 2018-06-14 DIAGNOSIS — I340 Nonrheumatic mitral (valve) insufficiency: Secondary | ICD-10-CM

## 2018-06-14 DIAGNOSIS — S12300A Unspecified displaced fracture of fourth cervical vertebra, initial encounter for closed fracture: Secondary | ICD-10-CM

## 2018-06-14 DIAGNOSIS — E440 Moderate protein-calorie malnutrition: Secondary | ICD-10-CM

## 2018-06-14 DIAGNOSIS — I361 Nonrheumatic tricuspid (valve) insufficiency: Secondary | ICD-10-CM | POA: Diagnosis not present

## 2018-06-14 DIAGNOSIS — N184 Chronic kidney disease, stage 4 (severe): Secondary | ICD-10-CM

## 2018-06-14 DIAGNOSIS — E039 Hypothyroidism, unspecified: Secondary | ICD-10-CM

## 2018-06-14 DIAGNOSIS — I4891 Unspecified atrial fibrillation: Secondary | ICD-10-CM

## 2018-06-14 DIAGNOSIS — I509 Heart failure, unspecified: Secondary | ICD-10-CM

## 2018-06-14 DIAGNOSIS — R55 Syncope and collapse: Secondary | ICD-10-CM | POA: Diagnosis not present

## 2018-06-14 DIAGNOSIS — I10 Essential (primary) hypertension: Secondary | ICD-10-CM

## 2018-06-14 LAB — TROPONIN I
TROPONIN I: 0.03 ng/mL — AB (ref <0.03)
Troponin I: 0.03 ng/mL (ref <0.03)
Troponin I: <0.03 ng/mL (ref <0.03)

## 2018-06-14 LAB — ECHOCARDIOGRAM COMPLETE
Height: 66 in
WEIGHTICAEL: 1728.41 [oz_av]

## 2018-06-14 LAB — MRSA PCR SCREENING: MRSA BY PCR: NEGATIVE

## 2018-06-14 MED ORDER — LEVOTHYROXINE SODIUM 88 MCG PO TABS
88.0000 ug | ORAL_TABLET | Freq: Every day | ORAL | Status: DC
  Administered 2018-06-14 – 2018-06-19 (×6): 88 ug via ORAL
  Filled 2018-06-14 (×6): qty 1

## 2018-06-14 MED ORDER — ACETAMINOPHEN 325 MG PO TABS
650.0000 mg | ORAL_TABLET | Freq: Four times a day (QID) | ORAL | Status: DC
  Administered 2018-06-14 – 2018-06-19 (×18): 650 mg via ORAL
  Filled 2018-06-14 (×18): qty 2

## 2018-06-14 MED ORDER — MELATONIN 3 MG PO TABS
3.0000 mg | ORAL_TABLET | Freq: Every evening | ORAL | Status: DC | PRN
  Administered 2018-06-17: 3 mg via ORAL
  Filled 2018-06-14 (×2): qty 1

## 2018-06-14 MED ORDER — TRAMADOL HCL 50 MG PO TABS
50.0000 mg | ORAL_TABLET | Freq: Four times a day (QID) | ORAL | Status: DC | PRN
  Administered 2018-06-14 (×2): 50 mg via ORAL
  Filled 2018-06-14 (×3): qty 1

## 2018-06-14 MED ORDER — DILTIAZEM HCL ER COATED BEADS 180 MG PO CP24
180.0000 mg | ORAL_CAPSULE | Freq: Every day | ORAL | Status: DC
  Administered 2018-06-14 – 2018-06-19 (×6): 180 mg via ORAL
  Filled 2018-06-14 (×6): qty 1

## 2018-06-14 MED ORDER — DULOXETINE HCL 30 MG PO CPEP
30.0000 mg | ORAL_CAPSULE | Freq: Every day | ORAL | Status: DC
  Administered 2018-06-14 – 2018-06-19 (×6): 30 mg via ORAL
  Filled 2018-06-14 (×6): qty 1

## 2018-06-14 MED ORDER — MAGNESIUM HYDROXIDE 400 MG/5ML PO SUSP
30.0000 mL | Freq: Every day | ORAL | Status: DC | PRN
  Administered 2018-06-17: 30 mL via ORAL
  Filled 2018-06-14: qty 30

## 2018-06-14 MED ORDER — ENSURE ENLIVE PO LIQD
237.0000 mL | Freq: Two times a day (BID) | ORAL | Status: DC
  Administered 2018-06-14 – 2018-06-19 (×8): 237 mL via ORAL

## 2018-06-14 NOTE — Progress Notes (Signed)
Heard wheezing sound.  Listen to lungs.   Wheezing in upper airways but thought I heard crackles in lower lungs.  Hard to determine since patient will not follow commands to take deep breath and to stop talking.    MD page IVF stopped and CXR ordered

## 2018-06-14 NOTE — Progress Notes (Signed)
  Echocardiogram 2D Echocardiogram has been performed.  Jennifer Bender 06/14/2018, 11:38 AM
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
## 2018-06-14 NOTE — Care Management Note (Signed)
Case Management Note  Patient Details  Name: Jennifer Bender MRN: [id number] Date of Birth: Dec 14, 1923  Subjective/Objective:  Syncope                 Action/Plan: Patient is from [facility]; SW consult placed to transition back to facility when medically stable.  Expected Discharge Date:   possibly 06/17/2018               Expected Discharge Plan:  Skilled Nursing Facility  Discharge planning Services  CM Consult    Status of Service:  In process, will continue to follow  Reola Mosher [phone] 06/14/2018, 2:00 PM

## 2018-06-14 NOTE — H&P (Signed)
History and Physical    Avaiah Stempel WUJ:[id number] DOB: 06/27/1924 DOA: 06/13/2018  PCP: Default, Provider, MD Patient coming from: Nursing home  Chief Complaint: Syncope  HPI: Jarelyn Bambach is a 84 y.o. female with medical history significant of atrial fibrillation, PPM, CKD IV, congestive heart failure, hypertension resenting to the ED after syncopal event.  Per chart, patient was in her wheelchair at [facility] place and went unresponsive in her wheelchair.. Staff lowered her to the ground and were about to start CPR when she started responding.  Patient was here yesterday after sustaining a fall where she struck her head and had a nondisplaced C4 lamina and spinous process fracture.  She was discharged back to the facility with an Aspen collar.  Patient was seen by neurosurgery and they felt that due to the patient's age and comorbidities she would not be a good candidate for surgery.   Granddaughter at bedside stated patient was found slumped over in her wheelchair at the nursing home.  EMS was called and before they could do CPR patient regained consciousness.  Patient denies having any chest pain, shortness of breath, or dizziness.  Denies having any nausea or vomiting.  States her neck pain is better after receiving a pain medication.  Granddaughter states that patient has a pacemaker and the family will contact her cardiologist in [place] to get more information.  States that the patient has previously expressed her desire to be DNR.  States patient should not be given narcotics as she is very sensitive and starts hallucinating.  Granddaughter believes patient's mental status is at baseline.  ED Course: Vitals stable on arrival to the ED.  CBG 116.  No leukocytosis.  Hemoglobin normal.  Creatinine 1.5; recent baseline 1.4-2.0.  I-STAT troponin 0.03.  UA not suggestive of infection.  Chest x-ray showing no active cardiopulmonary disease.  CT head showing a new finding of a small amount of  layering blood in the posterior horn of the left lateral ventricle.   Review of Systems: As per HPI otherwise 10 point review of systems negative.  Past Medical History:  Diagnosis Date  . Arthritis   . Atrial fibrillation (HCC)   . CHF (congestive heart failure) (HCC)   . Hypertension   . Pneumonia   . UTI (lower urinary tract infection)     Past Surgical History:  Procedure Laterality Date  . ABDOMINAL HYSTERECTOMY    . BREAST REDUCTION SURGERY    . CARDIOVERSION    . HAND SURGERY       reports that she has never smoked. She has never used smokeless tobacco. She reports that she does not drink alcohol or use drugs.  Allergies  Allergen Reactions  . Ace Inhibitors     Angioedema   . Colchicine     unknown  . Crestor [Rosuvastatin Calcium]     Myalgias   . Ezetimibe Other (See Comments)  . Hydrocodone-Acetaminophen Nausea Only  . Lipitor [Atorvastatin Calcium]     Myalgias   . Morphine And Related Other (See Comments)    Unknown  . Nsaids Nausea And Vomiting    Renal insufficiency  Renal sufficient  . Pravachol Nausea Only  . Rosuvastatin Other (See Comments)    Unknown  . Shellfish Allergy Other (See Comments)  . Tolmetin Other (See Comments)    Renal insufficiency   . Tramadol Nausea Only  . Zocor [Simvastatin]     Myalgias     History reviewed. No pertinent family history.  Prior  to Admission medications   Medication Sig Start Date End Date Taking? Authorizing Provider  acetaminophen (TYLENOL) 325 MG tablet Take 650 mg by mouth 4 (four) times daily as needed for mild pain.    Yes [provider]  diltiazem (DILACOR XR) 180 MG 24 hr capsule Take 180 mg by mouth daily.   Yes [provider]  DULoxetine (CYMBALTA) 30 MG capsule Take 30 mg by mouth daily.   Yes [provider]  fish oil-omega-3 fatty acids 1000 MG capsule Take 1 g by mouth daily.    Yes [provider]  levothyroxine (SYNTHROID, LEVOTHROID) 88 MCG tablet  Take 88 mcg by mouth daily before breakfast.   Yes [provider]  Melatonin 5 MG TABS Take 5 mg by mouth at bedtime as needed (sleep).   Yes [provider]  Menthol, Topical Analgesic, (ICE BLUE EX) Apply 1 application topically 3 (three) times daily.   Yes [provider]  Multiple Vitamin (MULITIVITAMIN WITH MINERALS) TABS Take 1 tablet by mouth daily. Daily-vite   Yes [provider]  potassium chloride (K-DUR,KLOR-CON) 10 MEQ tablet Take 10 mEq by mouth daily.   Yes [provider]  Skin Protectants, Misc. (EUCERIN) cream Apply 1 application topically at bedtime.   Yes [provider]  torsemide (DEMADEX) 20 MG tablet Take 40 mg by mouth 2 (two) times daily.    Yes [provider]    Physical Exam: Vitals:   06/13/18 1800 06/13/18 2030 06/13/18 2100 06/14/18 0012  BP: 106/68 136/64 137/81 126/62  Pulse:  88 87 76  Resp: 15 12 20 20   Temp:    97.9 F (36.6 C)  TempSrc:    Oral  SpO2:  93% (!) 88% 100%  Weight:    49 kg  Height:    5\' 6"  (1.676 m)   Physical Exam  Constitutional:  Frail elderly female  HENT:  Left forehead bruise  Eyes: Right eye exhibits no discharge. Left eye exhibits no discharge.  Unable to check pupils due to lack of patient cooperation.  Neck: Neck supple. No tracheal deviation present.  Cardiovascular: Normal rate, regular rhythm and intact distal pulses.  Pulmonary/Chest: Effort normal and breath sounds normal. No respiratory distress. She has no wheezes. She has no rales.  Abdominal: Soft. Bowel sounds are normal. She exhibits no distension. There is no tenderness.  Musculoskeletal: She exhibits no edema.  Neurological:  Alert, oriented to person only (baseline per granddaughter) Moving all extremities spontaneously Following commands  Skin: Skin is warm and dry.     Labs on Admission: I have personally reviewed following labs and imaging studies  CBC: Recent Labs  Lab  06/12/18 0659 06/13/18 1829  WBC 9.5 7.0  NEUTROABS  --  5.3  HGB 12.9 14.3  HCT 41.2 47.2*  MCV 100.2* 100.4*  PLT 225 177   Basic Metabolic Panel: Recent Labs  Lab 06/12/18 0659 06/13/18 1829  NA 141 144  K 3.9 3.9  CL 101 107  CO2 31 27  GLUCOSE 100* 121*  BUN 66* 53*  CREATININE 1.42* 1.55*  CALCIUM 8.9 8.6*   GFR: Estimated Creatinine Clearance: 17.9 mL/min (A) (by C-G formula based on SCr of 1.55 mg/dL (H)). Liver Function Tests: No results for input(s): AST, ALT, ALKPHOS, BILITOT, PROT, ALBUMIN in the last 168 hours. No results for input(s): LIPASE, AMYLASE in the last 168 hours. No results for input(s): AMMONIA in the last 168 hours. Coagulation Profile: No results for input(s): INR,  PROTIME in the last 168 hours. Cardiac Enzymes: Recent Labs  Lab 06/13/18 1829  TROPONINI 0.03*   BNP (last 3 results) No results for input(s): PROBNP in the last 8760 hours. HbA1C: No results for input(s): HGBA1C in the last 72 hours. CBG: Recent Labs  Lab 06/13/18 1827  GLUCAP 116*   Lipid Profile: No results for input(s): CHOL, HDL, LDLCALC, TRIG, CHOLHDL, LDLDIRECT in the last 72 hours. Thyroid Function Tests: No results for input(s): TSH, T4TOTAL, FREET4, T3FREE, THYROIDAB in the last 72 hours. Anemia Panel: No results for input(s): VITAMINB12, FOLATE, FERRITIN, TIBC, IRON, RETICCTPCT in the last 72 hours. Urine analysis:    Component Value Date/Time   COLORURINE YELLOW 06/13/2018 2220   APPEARANCEUR CLEAR 06/13/2018 2220   LABSPEC 1.013 06/13/2018 2220   PHURINE 5.0 06/13/2018 2220   GLUCOSEU NEGATIVE 06/13/2018 2220   HGBUR NEGATIVE 06/13/2018 2220   BILIRUBINUR NEGATIVE 06/13/2018 2220   KETONESUR NEGATIVE 06/13/2018 2220   PROTEINUR NEGATIVE 06/13/2018 2220   NITRITE NEGATIVE 06/13/2018 2220   LEUKOCYTESUR NEGATIVE 06/13/2018 2220    Radiological Exams on Admission: Dg Chest 2 View  Result Date: 06/13/2018 CLINICAL DATA:  Syncope EXAM: CHEST - 2  VIEW COMPARISON:  05/30/2018 FINDINGS: Cardiomegaly. Left pacer remains in place, unchanged. No confluent airspace opacities or effusions. No acute bony abnormality. Severe compression fracture noted in the lower thoracic spine, stable. Old right femoral neck fracture. IMPRESSION: Cardiomegaly.  No active cardiopulmonary disease. Electronically Signed   By: Charlett Nose M.D.   On: 06/13/2018 19:33   Ct Head Wo Contrast  Result Date: 06/13/2018 CLINICAL DATA:  Fall, altered level of consciousness EXAM: CT HEAD WITHOUT CONTRAST TECHNIQUE: Contiguous axial images were obtained from the base of the skull through the vertex without intravenous contrast. COMPARISON:  06/12/2018 FINDINGS: Brain: New since prior studies a small amount of layering blood in the posterior horn of the left lateral ventricle. No intraparenchymal hemorrhage. Advanced atrophy and chronic small vessel disease. Associated ventriculomegaly, stable. Vascular: No hyperdense vessel or unexpected calcification. Skull: No acute calvarial abnormality. Sinuses/Orbits: Complete opacification of the right frontal sinus, anterior ethmoid air cells, and right maxillary sinus. Other: Left scalp hematoma again noted, unchanged. IMPRESSION: New finding since prior study is a small amount of layering blood in the posterior horn of the left lateral ventricle. Atrophy, chronic small vessel disease. Electronically Signed   By: Charlett Nose M.D.   On: 06/13/2018 19:26   Ct Head Wo Contrast  Result Date: 06/12/2018 CLINICAL DATA:  Pain following trauma EXAM: CT HEAD WITHOUT CONTRAST CT CERVICAL SPINE WITHOUT CONTRAST TECHNIQUE: Multidetector CT imaging of the head and cervical spine was performed following the standard protocol without intravenous contrast. Multiplanar CT image reconstructions of the cervical spine were also generated. COMPARISON:  CT head and CT cervical spine May 28, 2018; CT cervical spine March 03, 2018 FINDINGS: CT HEAD FINDINGS Brain:  Moderate diffuse atrophy is stable. There is no intracranial mass, hemorrhage, extra-axial fluid collection, or midline shift. There is patchy small vessel disease in the centra semiovale bilaterally. No acute infarct is appreciable. Vascular: There is no hyperdense vessel. There is calcification in each carotid siphon region. Skull: Bony calvarium appears intact. There is a sizable left frontal scalp hematoma. There is a smaller left temporal scalp hematoma. There are skin staples in the anterior frontal vertex region midline. Sinuses/Orbits: There is opacification of most of the visualized right maxillary antrum. There is opacification of multiple anterior ethmoid air cells on the right as  well as the right frontal sinus. Orbits appear symmetric bilaterally. Other: Mastoid air cells are clear. CT CERVICAL SPINE FINDINGS Alignment: There is 1 mm of anterolisthesis of C4 on C5. No other spondylolisthesis evident. Skull base and vertebrae: There is an acute fracture involving the posterior aspect of the right C4 lamina extending to the junction of the lamina and spinous process of C4 without appreciable displacement. Fracture does not extend into the canal in this region. No other acute fracture is demonstrable. There is chronic anterior wedging of the C5 and T2 vertebral bodies which appear stable. Bones are osteoporotic. There are no blastic or lytic bone lesions. Soft tissues and spinal canal: Prevertebral soft tissues and predental space regions are normal. There is no paraspinous lesion. There is no evident cord or canal hematoma. Disc levels: There is severe disc space narrowing at C5-6 and C6-7. There is facet hypertrophy at multiple levels bilaterally. There is moderate exit foraminal narrowing at C3-4 on the right, at C4-5 on the right, and at C5-6 bilaterally due to bony hypertrophy. There is no frank disc extrusion or high-grade stenosis. Upper chest: Visualized upper lung zones are clear. Other: There is  calcification in each carotid artery. IMPRESSION: CT head: Left-sided scalp hematomas, largest in the left frontal region. Skin staples anterior vertex region. No fracture. There is stable atrophy with periventricular small vessel disease. No acute infarct. No intracranial mass or hemorrhage. Multifocal paranasal sinus disease on the right. Foci of arterial vascular calcification noted. CT cervical spine: 1. Acute fracture involving the posterior aspect of the C4 lamina on the right extending to the junction of the C4 lamina and spinous process, essentially nondisplaced. No bony fragments extending into the canal. No other acute fracture. 2.  Stable prior wedge fractures at C5 and T2. 3.  Multilevel arthropathy. 4.  1 mm anterolisthesis of C4 on C5, likely due to spondylosis. 5.  Carotid artery calcification bilaterally. Critical Value/emergent results were called by telephone at the time of interpretation on 06/12/2018 at 7:21 am to Dr. Jaci Carrel , who verbally acknowledged these results. Electronically Signed   By: Bretta Bang III M.D.   On: 06/12/2018 07:21   Ct Cervical Spine Wo Contrast  Result Date: 06/12/2018 CLINICAL DATA:  Pain following trauma EXAM: CT HEAD WITHOUT CONTRAST CT CERVICAL SPINE WITHOUT CONTRAST TECHNIQUE: Multidetector CT imaging of the head and cervical spine was performed following the standard protocol without intravenous contrast. Multiplanar CT image reconstructions of the cervical spine were also generated. COMPARISON:  CT head and CT cervical spine May 28, 2018; CT cervical spine March 03, 2018 FINDINGS: CT HEAD FINDINGS Brain: Moderate diffuse atrophy is stable. There is no intracranial mass, hemorrhage, extra-axial fluid collection, or midline shift. There is patchy small vessel disease in the centra semiovale bilaterally. No acute infarct is appreciable. Vascular: There is no hyperdense vessel. There is calcification in each carotid siphon region. Skull: Bony  calvarium appears intact. There is a sizable left frontal scalp hematoma. There is a smaller left temporal scalp hematoma. There are skin staples in the anterior frontal vertex region midline. Sinuses/Orbits: There is opacification of most of the visualized right maxillary antrum. There is opacification of multiple anterior ethmoid air cells on the right as well as the right frontal sinus. Orbits appear symmetric bilaterally. Other: Mastoid air cells are clear. CT CERVICAL SPINE FINDINGS Alignment: There is 1 mm of anterolisthesis of C4 on C5. No other spondylolisthesis evident. Skull base and vertebrae: There is an acute fracture  involving the posterior aspect of the right C4 lamina extending to the junction of the lamina and spinous process of C4 without appreciable displacement. Fracture does not extend into the canal in this region. No other acute fracture is demonstrable. There is chronic anterior wedging of the C5 and T2 vertebral bodies which appear stable. Bones are osteoporotic. There are no blastic or lytic bone lesions. Soft tissues and spinal canal: Prevertebral soft tissues and predental space regions are normal. There is no paraspinous lesion. There is no evident cord or canal hematoma. Disc levels: There is severe disc space narrowing at C5-6 and C6-7. There is facet hypertrophy at multiple levels bilaterally. There is moderate exit foraminal narrowing at C3-4 on the right, at C4-5 on the right, and at C5-6 bilaterally due to bony hypertrophy. There is no frank disc extrusion or high-grade stenosis. Upper chest: Visualized upper lung zones are clear. Other: There is calcification in each carotid artery. IMPRESSION: CT head: Left-sided scalp hematomas, largest in the left frontal region. Skin staples anterior vertex region. No fracture. There is stable atrophy with periventricular small vessel disease. No acute infarct. No intracranial mass or hemorrhage. Multifocal paranasal sinus disease on the  right. Foci of arterial vascular calcification noted. CT cervical spine: 1. Acute fracture involving the posterior aspect of the C4 lamina on the right extending to the junction of the C4 lamina and spinous process, essentially nondisplaced. No bony fragments extending into the canal. No other acute fracture. 2.  Stable prior wedge fractures at C5 and T2. 3.  Multilevel arthropathy. 4.  1 mm anterolisthesis of C4 on C5, likely due to spondylosis. 5.  Carotid artery calcification bilaterally. Critical Value/emergent results were called by telephone at the time of interpretation on 06/12/2018 at 7:21 am to Dr. Jaci Carrel , who verbally acknowledged these results. Electronically Signed   By: Bretta Bang III M.D.   On: 06/12/2018 07:21    EKG: Independently reviewed. Rate controlled Afib (HR 73). RBBB, LAFB (seen on prior tracing as well).  Assessment/Plan Principal Problem:   Syncope Active Problems:   Atrial fibrillation (HCC)   CKD (chronic kidney disease) stage 4, GFR 15-29 ml/min (HCC)   CHF (congestive heart failure) (HCC)   HTN (hypertension)   C4 cervical fracture (HCC)   Hypothyroidism   Syncope Vitals stable on arrival to the ED.  CBG 116.  No leukocytosis.  Hemoglobin normal.   I-STAT troponin 0.03.  EKG not suggestive of ACS. UA not suggestive of infection.  Chest x-ray showing no active cardiopulmonary disease.  CT head showing a new finding of a small amount of layering blood in the posterior horn of the left lateral ventricle.  Granddaughter believes her mental status is at baseline.  Patient denies having any chest pain, shortness of breath, dizziness, or headaches. -Pacemaker needs to be interrogated.  Information is needed from her cardiologist in [facility].  Granddaughter said she will contact their office in the morning. -Echo -Orthostatics -Continue to trend troponin -Monitor on telemetry -Neurosurgery consult number has been called to discuss new bleeding seen  on head CT.  Waiting to receive callback.  Atrial fibrillation -CHA2DS2VASc 5.  -Currently rate controlled.  Patient has a pacemaker. -Not on anticoagulation, likely due to fall risk and age  CKD 4 -Stable. Creatinine 1.5; recent baseline 1.4-2.0.  -Continue IVF  CHF -Does not appear volume overloaded on exam.  No prior echo seen in the chart. -Echo pending   Hypertension -Continue home Cardizem  Nondisplaced C4  lamina fracture -Seen on CT done June 12, 2018. -Aspen collar -Tramadol as needed pain -Avoid narcotics.  Patient is sensitive and has hallucinated in the past when given narcotics.  Hypothyroidism -Continue home Synthroid  DVT prophylaxis: SCDs Code Status: Granddaughter states patient has previously wished to be DNR. Family Communication: Granddaughter at bedside updated.   Disposition Plan: Anticipate d/c to nursing home in 1-2 days.  Consults called: Neurosurgery  Admission status: Observation    John Giovanni MD [facility] Pager [phone]  If 7PM-7AM, please contact night-coverage [email or web address] Password [email or web address]  06/14/2018, 6:42 AM

## 2018-06-14 NOTE — Progress Notes (Signed)
Attempted to do the echo at 9:20am.  Patient's family member expressed a desire for her to eat breakfast and hold off on echo.  Per conversation with nurse, will return later in the day to do echo.

## 2018-06-14 NOTE — Progress Notes (Signed)
Patient ID: Jennifer Bender, female   DOB: [birth year], 84 y.o.   MRN: [id number] 84 year old patient of Dr. Johnsie Cancel with follow-up CT scan showing a small amount of posterior left ventricular IVH head is clinically insignificant in the setting of no neuro changes normal and stable vital signs recommend strict blood pressure control and the patient displays any signs or symptoms of numbness tingling weakness arms or legs recommend can consult to neurology for possible rule out stroke.

## 2018-06-14 NOTE — Clinical Social Work Note (Signed)
Clinical Social Work Assessment  Patient Details  Name: Jennifer Bender MRN: [id number] Date of Birth: 1924/03/19  Date of referral:  06/14/18               Reason for consult:  Facility Placement, Discharge Planning                Permission sought to share information with:  Facility Medical sales representative, Family Supports Permission granted to share information::  No(patient not oriented)  Name::     [name]  Agency::  [employer] [facility]  Relationship::  daughter  Contact Information:  [phone]  Housing/Transportation Living arrangements for the past 2 months:  Assisted Living Facility(memory care) Source of Information:  Adult Children Patient Interpreter Needed:  None Criminal Activity/Legal Involvement Pertinent to Current Situation/Hospitalization:  No - Comment as needed Significant Relationships:  Adult Children Lives with:  Facility Resident Do you feel safe going back to the place where you live?  Yes Need for family participation in patient care:  Yes (Comment)  Care giving concerns: Patient is a resident at [facility] memory care.    Social Worker assessment / plan: CSW spoke to patient's daughter, Steward Drone, on the phone. Patient is only oriented to self. CSW discussed patient's disposition plan with daughter. Steward Drone indicated patient has been living at [facility], but has had several falls and hospital visits. Patient was recently in the ED and discharged back to [facility].   Steward Drone explained that [facility] was supposed to come evaluate patient at [place], but patient readmitted before they could. Steward Drone is hopeful that patient is eligible for hospice services. Steward Drone hopeful to move patient to a facility in [facility], which is where all of her family and friends are. Steward Drone gave permission to discuss patient with [facility] [place].  CSW did speak to [facility], who indicated she would  need a new referral since patient is now in the hospital. CSW requested palliative consult for goals of care from MD.  CSW to follow for patient's progress, medical readiness, and recommendations. CSW will support with disposition planning.  Employment status:  Retired Firefighter) PT Recommendations:  Not assessed at this time Information / Referral to community resources:     Patient/Family's Response to care: Patient's daughter very appreciative of care.  Patient/Family's Understanding of and Emotional Response to Diagnosis, Current Treatment, and Prognosis: Patient's daughter with understanding of patient's condition and hopeful for hospice services.  Emotional Assessment Appearance:    Attitude/Demeanor/Rapport:  Unable to Assess Affect (typically observed):  Unable to Assess Orientation:  Oriented to Self Alcohol / Substance use:  Not Applicable Psych involvement (Current and /or in the community):  No (Comment)  Discharge Needs  Concerns to be addressed:  Discharge Planning Concerns, Care Coordination Readmission within the last 30 days:  No Current discharge risk:  Physical Impairment, Cognitively Impaired Barriers to Discharge:  Continued Medical Work up   Abigail Butts, LCSW 06/14/2018, 4:13 PM

## 2018-06-14 NOTE — Progress Notes (Signed)
PROGRESS NOTE    Jennifer Bender  ZOX:[id number] DOB: 12-20-1923 DOA: 06/13/2018 PCP: Default, Provider, MD   Brief Narrative:  Jennifer Bender is Jennifer Bender 84 y.o. female with medical history significant of atrial fibrillation, PPM, CKD IV, congestive heart failure, hypertension resenting to the ED after syncopal event.  Per chart, patient was in her wheelchair at [facility] place and went unresponsive in her wheelchair.. Staff lowered her to the ground and were about to start CPR when she started responding.  Patient was here yesterday after sustaining [name] fall where she struck her head and had [name] nondisplaced C4 lamina and spinous process fracture.  She was discharged back to the facility with an Aspen collar.  Patient was seen by neurosurgery and they felt that due to the patient's age and comorbidities she would not be [name] good candidate for surgery.   Granddaughter at bedside stated patient was found slumped over in her wheelchair at the nursing home.  EMS was called and before they could do CPR patient regained consciousness.  Patient denies having any chest pain, shortness of breath, or dizziness.  Denies having any nausea or vomiting.  States her neck pain is better after receiving [name] pain medication.  Granddaughter states that patient has [name] pacemaker and the family will contact her cardiologist in [place] to get more information.  States that the patient has previously expressed her desire to be DNR.  States patient should not be given narcotics as she is very sensitive and starts hallucinating.  Granddaughter believes patient's mental status is at baseline.  Assessment & Plan:   Principal Problem:   Syncope Active Problems:   Atrial fibrillation (HCC)   CKD (chronic kidney disease) stage 4, GFR 15-29 ml/min (HCC)   CHF (congestive heart failure) (HCC)   HTN (hypertension)   C4 cervical fracture (HCC)   Hypothyroidism   Malnutrition of moderate degree   Syncope Vitals stable on presentation.     CBG 116.  No leukocytosis.  Hemoglobin normal.   EKG with atrial fibrillation, similar to priors UA negative Troponins low and flat CXR with cardiomegaly Head CT showed  Pacemaker interrogated and only notable for 7 beats of RVR on oct 3 Echo with normal EF, elevated PASP (see report) Orthostatics pending Etiology of syncope unclear, but pt appears back to baseline at this time.    Intraventricular Hemorrhage: suspect related to recent head trauma.  No focal deficits on exam.  Dr. Wynetta Emery recommends BP control and if neuro sx, c/s neurology. Neuro checks  Atrial fibrillation -CHA2DS2VASc 5.  -Currently rate controlled.  Patient has [name] pacemaker. -Not on anticoagulation, likely due to fall risk and age  CKD 4 -Stable. Creatinine 1.5; recent baseline 1.4-2.0.  -Continue IVF  CHF -Does not appear volume overloaded on exam.  No prior echo seen in the chart. -Echo as below   Hypertension -Continue home Cardizem  Nondisplaced C4 lamina fracture -Seen on CT done June 12, 2018. -Aspen collar -Tramadol as needed pain -Avoid narcotics.  Patient is sensitive and has hallucinated in the past when given narcotics.  Schedule APAP.  Hypothyroidism -Continue home Synthroid  Wheezing: called with concern for wheezing vs crackles per nursing.  Pt getting IVF, will d/c and follow CXR.  DVT prophylaxis: SCD Code Status: DNR Family Communication: granddaughter - expressed frustration she was discharged the other day Disposition Plan: pending improvement   Consultants:   neurosurgery  Procedures:  Echo Study Conclusions  - Left ventricle: The cavity size was normal. Wall thickness  was   increased in [name] pattern of mild LVH. Systolic function was normal.   The estimated ejection fraction was in the range of 60% to 65%. - Mitral valve: Mildly to moderately calcified annulus. Mildly   thickened leaflets . There was mild regurgitation. - Left atrium: The atrium was moderately  dilated. - Right ventricle: The cavity size was mildly dilated. Systolic   function was mildly reduced. - Right atrium: The atrium was severely dilated. - Tricuspid valve: There was moderate regurgitation. - Pulmonary arteries: PA peak pressure: 65 mm Hg (S).  Antimicrobials:  Anti-infectives (From admission, onward)   None         Subjective: C/o neck pain. Granddaughter notes she's close to her baseline  Objective: Vitals:   06/14/18 1154 06/14/18 1239 06/14/18 1653 06/14/18 1839  BP: (!) 150/70 (!) 175/86 119/75 122/62  Pulse:  69 64 (!) 58  Resp:  18 18 18   Temp:  98.2 F (36.8 C) 97.8 F (36.6 C) (!) 97.5 F (36.4 C)  TempSrc:  Oral Oral Oral  SpO2:  100% 98% (!) 89%  Weight:      Height:        Intake/Output Summary (Last 24 hours) at 06/14/2018 1924 Last data filed at 06/14/2018 1844 Gross per 24 hour  Intake 3576.49 ml  Output -  Net 3576.49 ml   Filed Weights   06/14/18 0012  Weight: 49 kg    Examination:  General exam: Appears calm and somewhat uncomfortable with neck pain and blood draws Respiratory system: Clear to auscultation. Respiratory effort normal. Cardiovascular system: S1 & S2 heard, RRR. No JVD Gastrointestinal system: Abdomen is nondistended, soft and nontender. Central nervous system: Alert. No focal neurological deficits. Extremities: Symmetric 5 x 5 power. Aspen C collar in place Skin: No rashes, lesions or ulcers Psychiatry: Judgement and insight appear normal. Mood & affect appropriate.     Data Reviewed: I have personally reviewed following labs and imaging studies  CBC: Recent Labs  Lab 06/12/18 0659 06/13/18 1829  WBC 9.5 7.0  NEUTROABS  --  5.3  HGB 12.9 14.3  HCT 41.2 47.2*  MCV 100.2* 100.4*  PLT 225 177   Basic Metabolic Panel: Recent Labs  Lab 06/12/18 0659 06/13/18 1829  NA 141 144  K 3.9 3.9  CL 101 107  CO2 31 27  GLUCOSE 100* 121*  BUN 66* 53*  CREATININE 1.42* 1.55*  CALCIUM 8.9 8.6*    GFR: Estimated Creatinine Clearance: 17.9 mL/min ([name]) (by C-G formula based on SCr of 1.55 mg/dL (H)). Liver Function Tests: No results for input(s): AST, ALT, ALKPHOS, BILITOT, PROT, ALBUMIN in the last 168 hours. No results for input(s): LIPASE, AMYLASE in the last 168 hours. No results for input(s): AMMONIA in the last 168 hours. Coagulation Profile: No results for input(s): INR, PROTIME in the last 168 hours. Cardiac Enzymes: Recent Labs  Lab 06/13/18 1829 06/14/18 0623 06/14/18 1154  TROPONINI 0.03* 0.03* 0.03*   BNP (last 3 results) No results for input(s): PROBNP in the last 8760 hours. HbA1C: No results for input(s): HGBA1C in the last 72 hours. CBG: Recent Labs  Lab 06/13/18 1827  GLUCAP 116*   Lipid Profile: No results for input(s): CHOL, HDL, LDLCALC, TRIG, CHOLHDL, LDLDIRECT in the last 72 hours. Thyroid Function Tests: No results for input(s): TSH, T4TOTAL, FREET4, T3FREE, THYROIDAB in the last 72 hours. Anemia Panel: No results for input(s): VITAMINB12, FOLATE, FERRITIN, TIBC, IRON, RETICCTPCT in the last 72 hours. Sepsis Labs: No  results for input(s): PROCALCITON, LATICACIDVEN in the last 168 hours.  Recent Results (from the past 240 hour(s))  MRSA PCR Screening     Status: None   Collection Time: 06/14/18  2:01 AM  Result Value Ref Range Status   MRSA by PCR NEGATIVE NEGATIVE Final    Comment:        The GeneXpert MRSA Assay (FDA approved for NASAL specimens only), is one component of Thais Silberstein comprehensive MRSA colonization surveillance program. It is not intended to diagnose MRSA infection nor to guide or monitor treatment for MRSA infections. Performed at [facility], [address] [place], [place] [address]          Radiology Studies: Dg Chest 2 View  Result Date: 06/13/2018 CLINICAL DATA:  Syncope EXAM: CHEST - 2 VIEW COMPARISON:  05/30/2018 FINDINGS: Cardiomegaly. Left pacer remains in place, unchanged. No confluent airspace  opacities or effusions. No acute bony abnormality. Severe compression fracture noted in the lower thoracic spine, stable. Old right femoral neck fracture. IMPRESSION: Cardiomegaly.  No active cardiopulmonary disease. Electronically Signed   By: Charlett Nose M.D.   On: 06/13/2018 19:33   Ct Head Wo Contrast  Result Date: 06/13/2018 CLINICAL DATA:  Fall, altered level of consciousness EXAM: CT HEAD WITHOUT CONTRAST TECHNIQUE: Contiguous axial images were obtained from the base of the skull through the vertex without intravenous contrast. COMPARISON:  06/12/2018 FINDINGS: Brain: New since prior studies Belkis Norbeck small amount of layering blood in the posterior horn of the left lateral ventricle. No intraparenchymal hemorrhage. Advanced atrophy and chronic small vessel disease. Associated ventriculomegaly, stable. Vascular: No hyperdense vessel or unexpected calcification. Skull: No acute calvarial abnormality. Sinuses/Orbits: Complete opacification of the right frontal sinus, anterior ethmoid air cells, and right maxillary sinus. Other: Left scalp hematoma again noted, unchanged. IMPRESSION: New finding since prior study is Cong Hightower small amount of layering blood in the posterior horn of the left lateral ventricle. Atrophy, chronic small vessel disease. Electronically Signed   By: Charlett Nose M.D.   On: 06/13/2018 19:26        Scheduled Meds: . acetaminophen  650 mg Oral Q6H  . diltiazem  180 mg Oral Daily  . DULoxetine  30 mg Oral Daily  . feeding supplement (ENSURE ENLIVE)  237 mL Oral BID BM  . levothyroxine  88 mcg Oral QAC breakfast   Continuous Infusions:   LOS: 0 days    Time spent: over 30 min    Lacretia Nicks, MD [facility] Pager [phone]  If 7PM-7AM, please contact night-coverage [email or web address] Password [facility] 06/14/2018, 7:24 PM

## 2018-06-15 DIAGNOSIS — R55 Syncope and collapse: Secondary | ICD-10-CM | POA: Diagnosis not present

## 2018-06-15 LAB — BASIC METABOLIC PANEL
Anion gap: 5 (ref 5–15)
BUN: 37 mg/dL — ABNORMAL HIGH (ref 8–23)
CO2: 27 mmol/L (ref 22–32)
Calcium: 8.9 mg/dL (ref 8.9–10.3)
Chloride: 112 mmol/L — ABNORMAL HIGH (ref 98–111)
Creatinine, Ser: 1.2 mg/dL — ABNORMAL HIGH (ref 0.44–1.00)
GFR calc non Af Amer: 38 mL/min — ABNORMAL LOW (ref >60)
GFR, EST AFRICAN AMERICAN: 44 mL/min — AB (ref >60)
Glucose, Bld: 101 mg/dL — ABNORMAL HIGH (ref 70–99)
Potassium: 4.5 mmol/L (ref 3.5–5.1)
SODIUM: 144 mmol/L (ref 135–145)

## 2018-06-15 LAB — MAGNESIUM: Magnesium: 2.2 mg/dL (ref 1.7–2.4)

## 2018-06-15 LAB — CBC
HEMATOCRIT: 34.8 % — AB (ref 36.0–46.0)
Hemoglobin: 10.7 g/dL — ABNORMAL LOW (ref 12.0–15.0)
MCH: 31.1 pg (ref 26.0–34.0)
MCHC: 30.7 g/dL (ref 30.0–36.0)
MCV: 101.2 fL — ABNORMAL HIGH (ref 80.0–100.0)
Platelets: 197 10*3/uL (ref 150–400)
RBC: 3.44 MIL/uL — ABNORMAL LOW (ref 3.87–5.11)
RDW: 12.7 % (ref 11.5–15.5)
WBC: 6.9 10*3/uL (ref 4.0–10.5)
nRBC: 0 % (ref 0.0–0.2)

## 2018-06-15 NOTE — Evaluation (Signed)
Physical Therapy Evaluation Patient Details Name: Jennifer Bender MRN: [id number] DOB: 1924/04/16 Today's Date: 06/15/2018   History of Present Illness  Pt is a 84 y.o. female with PMH significant for atrial fibrillation, PPM, CKD IV, congestive heart failure, hypertension resenting to the ED after syncopal event. She was seen 06/12/18 after a fall at SNF in which she sustained a nondisplaced C4 lamina and spinous process fracture and was discharged back to SNF with Aspen collar. Then 06/13/18 she returned after becoming unresponsive in wheelchair at [facility] and staff nearly began CPR when she began to respond. Admitted for workup due to potential head trauma related events.  Clinical Impression  Pt admitted with above diagnosis. Pt currently with functional limitations due to the deficits listed below (see PT Problem List). Pt needing mod asisst of 2 to transfer to chair with pt minimally weight bearing on LES.  Need to clarify pts baseline.  Pt does try to assist but has significant issues with joints.  Will follow acutely for trial of PT.  Pt will benefit from skilled PT to increase their independence and safety with mobility to allow discharge to the venue listed below.      Follow Up Recommendations SNF;Supervision/Assistance - 24 hour    Equipment Recommendations  None recommended by PT    Recommendations for Other Services       Precautions / Restrictions Precautions Precautions: Fall;Cervical Required Braces or Orthoses: Cervical Brace Cervical Brace: Hard collar;At all times Restrictions Weight Bearing Restrictions: No      Mobility  Bed Mobility Overal bed mobility: Needs Assistance Bed Mobility: Supine to Sit     Supine to sit: Mod assist;+2 for physical assistance     General bed mobility comments: Needed mod assist to come to EOB with assist needed for LEs and trunk elevation.   Transfers Overall transfer level: Needs assistance Equipment used: 2 person hand held  assist Transfers: Sit to/from [employer] Sit to Stand: Mod assist;+2 physical assistance Stand pivot transfers: +2 physical assistance;Mod assist;Min assist       General transfer comment: Initially attempting to stand and ambulate with 2 person handheld assistance but unable to progress due to bowed knees. Able to complete stand-pivot with good motor planning but still need for mod assist as pt needing cues and assist to take steps from bed to chair.  Pt does not weight bear fully on LEs and all of joints "creaking" with movement.   Ambulation/Gait                Stairs            Wheelchair Mobility    Modified Rankin (Stroke Patients Only)       Balance Overall balance assessment: Needs assistance Sitting-balance support: Feet supported;No upper extremity supported Sitting balance-Leahy Scale: Fair Sitting balance - Comments: Sat EOB 8 min with good balance.    Standing balance support: Bilateral upper extremity supported;During functional activity Standing balance-Leahy Scale: Poor Standing balance comment: relies on handheld assistance as well as +2 external support                             Pertinent Vitals/Pain Pain Assessment: Faces Faces Pain Scale: Hurts little more Pain Location: "my neck hurts" Pain Descriptors / Indicators: Grimacing Pain Intervention(s): Limited activity within patient's tolerance;Monitored during session;Repositioned    Home Living Family/patient expects to be discharged to:: Skilled nursing facility  Prior Function Level of Independence: Needs assistance         Comments: Pt unreliable historian with varying reports. Feel she likely has assistance for ADL.      Hand Dominance        Extremity/Trunk Assessment   Upper Extremity Assessment Upper Extremity Assessment: Defer to OT evaluation    Lower Extremity Assessment Lower Extremity Assessment: RLE  deficits/detail;LLE deficits/detail RLE Deficits / Details: Pt's LE bowed out upon standing.  Grossly 2/5 strength LLE Deficits / Details: grossly 2+/5 strength    Cervical / Trunk Assessment Cervical / Trunk Assessment: Kyphotic  Communication   Communication: Expressive difficulties  Cognition Arousal/Alertness: Awake/alert Behavior During Therapy: WFL for tasks assessed/performed Overall Cognitive Status: History of cognitive impairments - at baseline                                 General Comments: Pt with noted expressive deficits and difficulty responding approrpiately to questions. She was able to state that she is at [facility] due to a fall. She required multiple repetitions of directions.       General Comments General comments (skin integrity, edema, etc.): Pt had bil hand restraints on.  Removed them so that pt could eat breakfast and asked NT to replace once she finished breakfast.  Pt was able to feed herself her breakfast once set up.     Exercises     Assessment/Plan    PT Assessment Patient needs continued PT services  PT Problem List Decreased activity tolerance;Decreased balance;Decreased mobility;Decreased knowledge of use of DME;Decreased safety awareness;Decreased knowledge of precautions       PT Treatment Interventions DME instruction;Gait training;Functional mobility training;Therapeutic activities;Therapeutic exercise;Balance training;Patient/family education;Wheelchair mobility training    PT Goals (Current goals can be found in the Care Plan section)  Acute Rehab PT Goals Patient Stated Goal: to get some coffee PT Goal Formulation: With patient Time For Goal Achievement: 06/29/18 Potential to Achieve Goals: Good    Frequency Min 2X/week   Barriers to discharge Decreased caregiver support      Co-evaluation PT/OT/SLP Co-Evaluation/Treatment: Yes Reason for Co-Treatment: Complexity of the patient's impairments  (multi-system involvement) PT goals addressed during session: Mobility/safety with mobility         AM-PAC PT "6 Clicks" Daily Activity  Outcome Measure Difficulty turning over in bed (including adjusting bedclothes, sheets and blankets)?: Unable Difficulty moving from lying on back to sitting on the side of the bed? : Unable Difficulty sitting down on and standing up from a chair with arms (e.g., wheelchair, bedside commode, etc,.)?: Unable Help needed moving to and from a bed to chair (including a wheelchair)?: A Lot Help needed walking in hospital room?: Total Help needed climbing 3-5 steps with a railing? : Total 6 Click Score: 7    End of Session Equipment Utilized During Treatment: Gait belt Activity Tolerance: Patient limited by fatigue Patient left: in chair;with call bell/phone within reach;with chair alarm set Nurse Communication: Mobility status(replace hand restraints once finished with breakfast) PT Visit Diagnosis: Unsteadiness on feet (R26.81);Other abnormalities of gait and mobility (R26.89)    Time: 1610-9604 PT Time Calculation (min) (ACUTE ONLY): 31 min   Charges:   PT Evaluation $PT Eval Moderate Complexity: 1 Mod          Kahlel Peake,PT Acute Rehabilitation Services Pager:  [phone]  Office:  [phone]    Berline Lopes 06/15/2018, 11:55 AM

## 2018-06-15 NOTE — Evaluation (Addendum)
Occupational Therapy Evaluation  Patient Details Name: Jennifer Bender MRN: [id number] DOB: 1924/08/13 Today's Date: 06/15/2018    History of Present Illness Pt is a 84 y.o. female with PMH significant for atrial fibrillation, PPM, CKD IV, congestive heart failure, hypertension resenting to the ED after syncopal event. She was seen 06/12/18 after a fall at SNF in which she sustained a nondisplaced C4 lamina and spinous process fracture and was discharged back to SNF with Aspen collar. Then 06/13/18 she returned after becoming unresponsive in wheelchair at [facility] and staff nearly began CPR when she began to respond. Admitted for workup due to potential head trauma related events.   Clinical Impression   PTA, pt was at SNF and reports assisting with her self-care tasks. She was able to feed herself with supervision and complete LB dressing tasks with mod assist this session. However, unsure of reliability of pt's report of PLOF due to confusion. She presents with expressive deficits in communication and note cognitive deficits at baseline. She was abel to complete stand-pivot simulated toilet transfer with mod assist +2 today. Pt would benefit from continued OT services at SNF level post-acute D/C to maximize engagement in daily occupations. Feel that all OT needs will be met in that venue and no further acute OT needs identified at this time. Will sign off.     Follow Up Recommendations  SNF;Supervision/Assistance - 24 hour    Equipment Recommendations  None recommended by OT    Recommendations for Other Services       Precautions / Restrictions Precautions Precautions: Fall;Cervical Required Braces or Orthoses: Cervical Brace Cervical Brace: Hard collar;At all times Restrictions Weight Bearing Restrictions: No      Mobility Bed Mobility                  Transfers Overall transfer level: Needs assistance Equipment used: 2 person hand held assist Transfers: Sit to/from  Stand;Stand Pivot Transfers Sit to Stand: Mod assist;+2 physical assistance Stand pivot transfers: +2 physical assistance;Mod assist       General transfer comment: Initially attempting to stand and ambulate with 2 person handheld assistance but unable to progress due to bowed knees. Able to complete stand-pivot with good motor planning.     Balance Overall balance assessment: Needs assistance Sitting-balance support: Feet supported;No upper extremity supported Sitting balance-Leahy Scale: Fair     Standing balance support: Bilateral upper extremity supported;During functional activity Standing balance-Leahy Scale: Poor Standing balance comment: relies on handheld assistance                           ADL either performed or assessed with clinical judgement   ADL Overall ADL's : Needs assistance/impaired Eating/Feeding: Set up;Supervision/ safety;Sitting   Grooming: Supervision/safety;Set up;Sitting   Upper Body Bathing: Minimal assistance;Sitting   Lower Body Bathing: Moderate assistance;Sit to/from stand   Upper Body Dressing : Minimal assistance;Sitting   Lower Body Dressing: Moderate assistance;Sit to/from stand   Toilet Transfer: +2 for physical assistance;Stand-pivot;Moderate assistance   Toileting- Clothing Manipulation and Hygiene: Maximal assistance;Sit to/from stand       Functional mobility during ADLs: +2 for physical assistance;Minimal assistance(taking pivotal steps to chair) General ADL Comments: Pt is able to hold some conversation but with inappropriate responses noted at times. She is able to assist with donning socks and feed herself although noted difficulty maintaining food on fork because of attention.      Vision Baseline Vision/History: (unsure; pt unable to answer) Vision  Assessment?: Yes Eye Alignment: Within Functional Limits Ocular Range of Motion: Within Functional Limits Alignment/Gaze Preference: Within Defined  Limits Tracking/Visual Pursuits: Able to track stimulus in all quads without difficulty Additional Comments: Able to track and identify objects well.      Perception     Praxis      Pertinent Vitals/Pain Pain Assessment: Faces Faces Pain Scale: Hurts little more Pain Location: "my neck hurts" Pain Descriptors / Indicators: Grimacing Pain Intervention(s): Limited activity within patient's tolerance;Monitored during session;Repositioned     Hand Dominance     Extremity/Trunk Assessment Upper Extremity Assessment Upper Extremity Assessment: Generalized weakness   Lower Extremity Assessment Lower Extremity Assessment: Defer to PT evaluation       Communication Communication Communication: Expressive difficulties   Cognition Arousal/Alertness: Awake/alert Behavior During Therapy: WFL for tasks assessed/performed Overall Cognitive Status: History of cognitive impairments - at baseline                                 General Comments: Pt with noted expressive deficits and difficulty responding approrpiately to questions. She was able to state that she is at [facility] due to a fall. She required multiple repetitions of directions.    General Comments  No family present in room.     Exercises     Shoulder Instructions      Home Living Family/patient expects to be discharged to:: Skilled nursing facility                                        Prior Functioning/Environment Level of Independence: Needs assistance        Comments: Pt unreliable historian with varying reports. Feel she likely has assistance for ADL.         OT Problem List: Decreased strength;Decreased range of motion;Decreased activity tolerance;Impaired balance (sitting and/or standing);Decreased cognition;Decreased safety awareness;Decreased knowledge of use of DME or AE;Decreased knowledge of precautions;Pain      OT Treatment/Interventions:      OT  Goals(Current goals can be found in the care plan section) Acute Rehab OT Goals Patient Stated Goal: to get some coffee OT Goal Formulation: With patient  OT Frequency:     Barriers to D/C:            Co-evaluation PT/OT/SLP Co-Evaluation/Treatment: Yes Reason for Co-Treatment: Complexity of the patient's impairments (multi-system involvement);Necessary to address cognition/behavior during functional activity;For patient/therapist safety;To address functional/ADL transfers          AM-PAC PT "6 Clicks" Daily Activity     Outcome Measure Help from another person eating meals?: None Help from another person taking care of personal grooming?: None Help from another person toileting, which includes using toliet, bedpan, or urinal?: A Lot Help from another person bathing (including washing, rinsing, drying)?: A Lot Help from another person to put on and taking off regular upper body clothing?: A Little Help from another person to put on and taking off regular lower body clothing?: A Lot 6 Click Score: 17   End of Session Equipment Utilized During Treatment: Gait belt Nurse Communication: Mobility status(pt eating breakfast in chair with mitts off)  Activity Tolerance: Patient tolerated treatment well Patient left: in chair;with call bell/phone within reach;with chair alarm set  OT Visit Diagnosis: Other abnormalities of gait and mobility (R26.89);Other symptoms and signs involving cognitive function  Time: 1552-0802 OT Time Calculation (min): 22 min Charges:  OT General Charges $OT Visit: 1 Visit OT Evaluation $OT Eval Moderate Complexity: Montrose Catron A Genelda Roark 06/15/2018, 10:55 AM

## 2018-06-15 NOTE — Progress Notes (Signed)
PROGRESS NOTE    Jennifer Bender  VWU:[id number] DOB: 1923/09/16 DOA: 06/13/2018 PCP: Default, Provider, MD   Brief Narrative:  Jennifer Bender is Jennifer Bender 84 y.o. female with medical history significant of atrial fibrillation, PPM, CKD IV, congestive heart failure, hypertension resenting to the ED after syncopal event.  Per chart, patient was in her wheelchair at [facility] place and went unresponsive in her wheelchair.. Staff lowered her to the ground and were about to start CPR when she started responding.  Patient was here yesterday after sustaining Jennifer Bender fall where she struck her head and had Jennifer Bender nondisplaced C4 lamina and spinous process fracture.  She was discharged back to the facility with an Aspen collar.  Patient was seen by neurosurgery and they felt that due to the patient's age and comorbidities she would not be Jennifer Bender good candidate for surgery.   Granddaughter at bedside stated patient was found slumped over in her wheelchair at the nursing home.  EMS was called and before they could do CPR patient regained consciousness.  Patient denies having any chest pain, shortness of breath, or dizziness.  Denies having any nausea or vomiting.  States her neck pain is better after receiving Jamille Yoshino pain medication.  Granddaughter states that patient has Jennifer Bender pacemaker and the family will contact her cardiologist in [place] to get more information.  States that the patient has previously expressed her desire to be DNR.  States patient should not be given narcotics as she is very sensitive and starts hallucinating.  Granddaughter believes patient's mental status is at baseline.  Assessment & Plan:   Principal Problem:   Syncope Active Problems:   Atrial fibrillation (HCC)   CKD (chronic kidney disease) stage 4, GFR 15-29 ml/min (HCC)   CHF (congestive heart failure) (HCC)   HTN (hypertension)   C4 cervical fracture (HCC)   Hypothyroidism   Malnutrition of moderate degree   Syncope Vitals stable on presentation.     CBG 116.  No leukocytosis.  Hemoglobin normal.   EKG with atrial fibrillation, similar to priors UA negative Troponins low and flat CXR with cardiomegaly Head CT showed  Pacemaker interrogated and only notable for 7 beats of RVR on oct 3 Echo with normal EF, elevated PASP (see report) Orthostatics pending Etiology of syncope unclear, but pt appears back to baseline at this time.    Intraventricular Hemorrhage: suspect related to recent head trauma.  No focal deficits on exam.  Dr. Wynetta Emery recommends BP control and if neuro sx, c/s neurology. Neuro checks  Acute Encephalopathy: pt delirious overnight, took off c collar requiring mittens.  Better now, follow.  Delirium precautions.  Atrial fibrillation -CHA2DS2VASc 5.  -Currently rate controlled.  Patient has Ziare Orrick pacemaker. -Not on anticoagulation, likely due to fall risk and age  CKD 4 -Stable. Creatinine 1.2; recent baseline 1.4-2.0.  -Continue IVF  CHF -Does not appear volume overloaded on exam.  No prior echo seen in the chart. -Echo as below   Hypertension -Continue home Cardizem  Nondisplaced C4 lamina fracture -Seen on CT done June 12, 2018. -Aspen collar -Tramadol as needed pain -Avoid narcotics.  Patient is sensitive and has hallucinated in the past when given narcotics.  Schedule APAP. - Needs to follow with Dr. Maurice Small as outpatient  Hypothyroidism -Continue home Synthroid  Wheezing: cxr without edema or consolidation  Anemia: Hb decreased from 14.3 to 10.7.  No si/sx bleeding.  Suspect this was due to aggressive IVF yesterday.  Follow.  Goals of care: apparently hospice was going  to come out to see Jennifer Bender at [facility] yesterday.  Granddaughter expresses interest in talking to palliative care here.  Will place c/s, but discussed that they may not see pt before she discharges, as it's weekend, but if she discharges prior to palliative care c/s, will recommend palliative to follow as outpatient.     DVT prophylaxis: SCD Code Status: DNR Family Communication: granddaughter Disposition Plan: pending SNF placement   Consultants:   neurosurgery  Procedures:  Echo Study Conclusions  - Left ventricle: The cavity size was normal. Wall thickness was   increased in Jennifer Bender pattern of mild LVH. Systolic function was normal.   The estimated ejection fraction was in the range of 60% to 65%. - Mitral valve: Mildly to moderately calcified annulus. Mildly   thickened leaflets . There was mild regurgitation. - Left atrium: The atrium was moderately dilated. - Right ventricle: The cavity size was mildly dilated. Systolic   function was mildly reduced. - Right atrium: The atrium was severely dilated. - Tricuspid valve: There was moderate regurgitation. - Pulmonary arteries: PA peak pressure: 65 mm Hg (S).  Antimicrobials:  Anti-infectives (From admission, onward)   None         Subjective: C/o neck pain. Granddaughter notes she's close to her baseline  Objective: Vitals:   06/15/18 0504 06/15/18 0509 06/15/18 1050 06/15/18 1306  BP: 132/77 123/75 128/62 (!) 110/54  Pulse: 73 61  67  Resp: 19   18  Temp: (!) 97.5 F (36.4 C)   98.2 F (36.8 C)  TempSrc: Oral   Oral  SpO2: 99%   (!) 77%  Weight:  47 kg    Height:        Intake/Output Summary (Last 24 hours) at 06/15/2018 1859 Last data filed at 06/15/2018 1456 Gross per 24 hour  Intake 717 ml  Output 425 ml  Net 292 ml   Filed Weights   06/14/18 0012 06/15/18 0509  Weight: 49 kg 47 kg    Examination:  General exam: Appears calm and somewhat uncomfortable with neck pain and blood draws Respiratory system: Clear to auscultation. Respiratory effort normal. Cardiovascular system: S1 & S2 heard, RRR. No JVD Gastrointestinal system: Abdomen is nondistended, soft and nontender. Central nervous system: Alert. No focal neurological deficits. Extremities: Symmetric 5 x 5 power. Aspen C collar in place Skin: No rashes,  lesions or ulcers Psychiatry: Judgement and insight appear normal. Mood & affect appropriate.     Data Reviewed: I have personally reviewed following labs and imaging studies  CBC: Recent Labs  Lab 06/12/18 0659 06/13/18 1829 06/15/18 0635  WBC 9.5 7.0 6.9  NEUTROABS  --  5.3  --   HGB 12.9 14.3 10.7*  HCT 41.2 47.2* 34.8*  MCV 100.2* 100.4* 101.2*  PLT 225 177 197   Basic Metabolic Panel: Recent Labs  Lab 06/12/18 0659 06/13/18 1829 06/15/18 0635  NA 141 144 144  K 3.9 3.9 4.5  CL 101 107 112*  CO2 31 27 27   GLUCOSE 100* 121* 101*  BUN 66* 53* 37*  CREATININE 1.42* 1.55* 1.20*  CALCIUM 8.9 8.6* 8.9  MG  --   --  2.2   GFR: Estimated Creatinine Clearance: 22.2 mL/min (Jennifer Bender) (by C-G formula based on SCr of 1.2 mg/dL (H)). Liver Function Tests: No results for input(s): AST, ALT, ALKPHOS, BILITOT, PROT, ALBUMIN in the last 168 hours. No results for input(s): LIPASE, AMYLASE in the last 168 hours. No results for input(s): AMMONIA in the last 168  hours. Coagulation Profile: No results for input(s): INR, PROTIME in the last 168 hours. Cardiac Enzymes: Recent Labs  Lab 06/13/18 1829 06/14/18 0623 06/14/18 1154 06/14/18 1743  TROPONINI 0.03* 0.03* 0.03* <0.03   BNP (last 3 results) No results for input(s): PROBNP in the last 8760 hours. HbA1C: No results for input(s): HGBA1C in the last 72 hours. CBG: Recent Labs  Lab 06/13/18 1827  GLUCAP 116*   Lipid Profile: No results for input(s): CHOL, HDL, LDLCALC, TRIG, CHOLHDL, LDLDIRECT in the last 72 hours. Thyroid Function Tests: No results for input(s): TSH, T4TOTAL, FREET4, T3FREE, THYROIDAB in the last 72 hours. Anemia Panel: No results for input(s): VITAMINB12, FOLATE, FERRITIN, TIBC, IRON, RETICCTPCT in the last 72 hours. Sepsis Labs: No results for input(s): PROCALCITON, LATICACIDVEN in the last 168 hours.  Recent Results (from the past 240 hour(s))  MRSA PCR Screening     Status: None   Collection  Time: 06/14/18  2:01 AM  Result Value Ref Range Status   MRSA by PCR NEGATIVE NEGATIVE Final    Comment:        The GeneXpert MRSA Assay (FDA approved for NASAL specimens only), is one component of Hilliary Jock comprehensive MRSA colonization surveillance program. It is not intended to diagnose MRSA infection nor to guide or monitor treatment for MRSA infections. Performed at [facility], [address], [place], [place] [address]          Radiology Studies: Dg Chest 2 View  Result Date: 06/13/2018 CLINICAL DATA:  Syncope EXAM: CHEST - 2 VIEW COMPARISON:  05/30/2018 FINDINGS: Cardiomegaly. Left pacer remains in place, unchanged. No confluent airspace opacities or effusions. No acute bony abnormality. Severe compression fracture noted in the lower thoracic spine, stable. Old right femoral neck fracture. IMPRESSION: Cardiomegaly.  No active cardiopulmonary disease. Electronically Signed   By: Charlett Nose M.D.   On: 06/13/2018 19:33   Ct Head Wo Contrast  Result Date: 06/13/2018 CLINICAL DATA:  Fall, altered level of consciousness EXAM: CT HEAD WITHOUT CONTRAST TECHNIQUE: Contiguous axial images were obtained from the base of the skull through the vertex without intravenous contrast. COMPARISON:  06/12/2018 FINDINGS: Brain: New since prior studies Maxi Rodas small amount of layering blood in the posterior horn of the left lateral ventricle. No intraparenchymal hemorrhage. Advanced atrophy and chronic small vessel disease. Associated ventriculomegaly, stable. Vascular: No hyperdense vessel or unexpected calcification. Skull: No acute calvarial abnormality. Sinuses/Orbits: Complete opacification of the right frontal sinus, anterior ethmoid air cells, and right maxillary sinus. Other: Left scalp hematoma again noted, unchanged. IMPRESSION: New finding since prior study is Alijah Akram small amount of layering blood in the posterior horn of the left lateral ventricle. Atrophy, chronic small vessel disease.  Electronically Signed   By: Charlett Nose M.D.   On: 06/13/2018 19:26   Dg Chest Port 1 View  Result Date: 06/14/2018 CLINICAL DATA:  Lung crackles EXAM: PORTABLE CHEST 1 VIEW COMPARISON:  06/13/2018 FINDINGS: Dual-chamber pacer leads from the left are in stable position. Chronic cardiomegaly. There is no edema, consolidation, effusion, or pneumothorax. Stable interstitial coarsening. Artifact from overlapping hardware. IMPRESSION: Cardiomegaly without failure. Electronically Signed   By: Marnee Spring M.D.   On: 06/14/2018 19:25        Scheduled Meds: . acetaminophen  650 mg Oral Q6H  . diltiazem  180 mg Oral Daily  . DULoxetine  30 mg Oral Daily  . feeding supplement (ENSURE ENLIVE)  237 mL Oral BID BM  . levothyroxine  88 mcg Oral  QAC breakfast   Continuous Infusions:   LOS: 0 days    Time spent: over 30 min    Lacretia Nicks, MD [facility] Pager [phone]  If 7PM-7AM, please contact night-coverage [email or web address] Password [facility] 06/15/2018, 6:59 PM

## 2018-06-15 NOTE — [place] FL2 (Signed)
[place] MEDICAID FL2 LEVEL OF CARE SCREENING TOOL     IDENTIFICATION  Patient Name: Jennifer Bender Birthdate: 01/03/1924 Sex: female Admission Date (Current Location): 06/13/2018  [facility] and [place] Number:  Producer, television/film/video and Address:  [facility], [address] [place], [place] [address]      Provider Number: [id number]  Attending Physician Name and Address:  Zigmund Daniel., *  Relative Name and Phone Number:  [name]; daughter; [phone]    Current Level of Care: Hospital Recommended Level of Care: Memory Care Prior Approval Number:    Date Approved/Denied:   PASRR Number:    Discharge Plan: Other (Comment)([facility] (Memory Care))    Current Diagnoses: Patient Active Problem List   Diagnosis Date Noted  . Atrial fibrillation (HCC) 06/14/2018  . CKD (chronic kidney disease) stage 4, GFR 15-29 ml/min (HCC) 06/14/2018  . CHF (congestive heart failure) (HCC) 06/14/2018  . HTN (hypertension) 06/14/2018  . C4 cervical fracture (HCC) 06/14/2018  . Hypothyroidism 06/14/2018  . Malnutrition of moderate degree 06/14/2018  . Syncope 06/13/2018    Orientation RESPIRATION BLADDER Height & Weight     Self  Normal External catheter, Incontinent Weight: 103 lb 9.9 oz (47 kg) Height:  5\' 6"  (167.6 cm)  BEHAVIORAL SYMPTOMS/MOOD NEUROLOGICAL BOWEL NUTRITION STATUS      Continent Diet(regular diet; thin liquids)  AMBULATORY STATUS COMMUNICATION OF NEEDS Skin   Extensive Assist Verbally Other (Comment), Skin abrasions(MASD on sacrum; abrasions on bilateral arms)                       Personal Care Assistance Level of Assistance  Bathing, Feeding, Dressing Bathing Assistance: Maximum assistance Feeding assistance: Limited assistance Dressing Assistance: Maximum assistance     Functional Limitations Info  Sight, Hearing, Speech Sight Info: Adequate Hearing Info: Adequate Speech Info: Adequate    SPECIAL CARE  FACTORS FREQUENCY                       Contractures Contractures Info: Not present    Additional Factors Info  Code Status, Allergies, Psychotropic Code Status Info: ACE INHIBITORS, COLCHICINE, CRESTOR ROSUVASTATIN CALCIUM, EZETIMIBE, HYDROCODONE-ACETAMINOPHEN, LIPITOR ATORVASTATIN CALCIUM, MORPHINE AND RELATED, NSAIDS, PRAVACHOL, ROSUVASTATIN, SHELLFISH ALLERGY, TOLMETIN, TRAMADOL, ZOCOR SIMVASTATIN  Allergies Info: DNR Psychotropic Info: DULoxetine (CYMBALTA) DR capsule 30 mg daily PO         Current Medications (06/15/2018):  This is the current hospital active medication list Current Facility-Administered Medications  Medication Dose Route Frequency Provider Last Rate Last Dose  . acetaminophen (TYLENOL) tablet 650 mg  650 mg Oral Q6H Zigmund Daniel., MD   650 mg at 06/14/18 2351  . diltiazem (CARDIZEM CD) 24 hr capsule 180 mg  180 mg Oral Daily John Giovanni, MD   180 mg at 06/14/18 1154  . DULoxetine (CYMBALTA) DR capsule 30 mg  30 mg Oral Daily John Giovanni, MD   30 mg at 06/14/18 1154  . feeding supplement (ENSURE ENLIVE) (ENSURE ENLIVE) liquid 237 mL  237 mL Oral BID BM John Giovanni, MD   237 mL at 06/14/18 1754  . levothyroxine (SYNTHROID, LEVOTHROID) tablet 88 mcg  88 mcg Oral QAC breakfast John Giovanni, MD   88 mcg at 06/15/18 0528  . magnesium hydroxide (MILK OF MAGNESIA) suspension 30 mL  30 mL Oral Daily PRN Zigmund Daniel., MD      . Melatonin TABS 3 mg  3 mg  Oral QHS PRN John Giovanni, MD      . traMADol Janean Sark) tablet 50 mg  50 mg Oral Q6H PRN John Giovanni, MD   50 mg at 06/14/18 1155     Discharge Medications: Please see discharge summary for a list of discharge medications.  Relevant Imaging Results:  Relevant Lab Results:   Additional Information [phone]; pt family requesting services through [facility] [place]

## 2018-06-15 NOTE — Progress Notes (Signed)
Attempted to due orthostatic vital signs, able to do lying and sitting, pt unable to stand long enough to do the standing and was intent to get to bedside commode, pt very unsteady and assist x2, pt voided, cleansed, assisted back to bed and new purewick, pt denies dizziness

## 2018-06-15 NOTE — Progress Notes (Addendum)
Patient resting in bed at shift report. Aspen-Collar off pt, on her lap. Collar replaced and bed alarm set.  Pt heard from neighboring room throwing aspen collar. Collar replaced, again; mittens placed on pt's hands to prevent further manipulation/removal of the collar. Once pt woke and lights were turned on, mittens were removed as she no longer displayed a need for them.

## 2018-06-16 ENCOUNTER — Observation Stay (HOSPITAL_COMMUNITY): Payer: Medicare Other

## 2018-06-16 DIAGNOSIS — I509 Heart failure, unspecified: Secondary | ICD-10-CM | POA: Diagnosis not present

## 2018-06-16 DIAGNOSIS — I10 Essential (primary) hypertension: Secondary | ICD-10-CM

## 2018-06-16 DIAGNOSIS — R0989 Other specified symptoms and signs involving the circulatory and respiratory systems: Secondary | ICD-10-CM

## 2018-06-16 DIAGNOSIS — H21562 Pupillary abnormality, left eye: Secondary | ICD-10-CM

## 2018-06-16 DIAGNOSIS — E440 Moderate protein-calorie malnutrition: Secondary | ICD-10-CM

## 2018-06-16 DIAGNOSIS — Z515 Encounter for palliative care: Secondary | ICD-10-CM

## 2018-06-16 DIAGNOSIS — Z66 Do not resuscitate: Secondary | ICD-10-CM

## 2018-06-16 DIAGNOSIS — Z7189 Other specified counseling: Secondary | ICD-10-CM

## 2018-06-16 DIAGNOSIS — S12391A Other nondisplaced fracture of fourth cervical vertebra, initial encounter for closed fracture: Secondary | ICD-10-CM

## 2018-06-16 DIAGNOSIS — N184 Chronic kidney disease, stage 4 (severe): Secondary | ICD-10-CM | POA: Diagnosis not present

## 2018-06-16 DIAGNOSIS — I615 Nontraumatic intracerebral hemorrhage, intraventricular: Secondary | ICD-10-CM

## 2018-06-16 DIAGNOSIS — S0003XA Contusion of scalp, initial encounter: Secondary | ICD-10-CM

## 2018-06-16 DIAGNOSIS — R55 Syncope and collapse: Secondary | ICD-10-CM | POA: Diagnosis not present

## 2018-06-16 LAB — CBC
HCT: 32.9 % — ABNORMAL LOW (ref 36.0–46.0)
HEMOGLOBIN: 10.3 g/dL — AB (ref 12.0–15.0)
MCH: 31.3 pg (ref 26.0–34.0)
MCHC: 31.3 g/dL (ref 30.0–36.0)
MCV: 100 fL (ref 80.0–100.0)
PLATELETS: 184 10*3/uL (ref 150–400)
RBC: 3.29 MIL/uL — AB (ref 3.87–5.11)
RDW: 12.8 % (ref 11.5–15.5)
WBC: 9.2 10*3/uL (ref 4.0–10.5)
nRBC: 0 % (ref 0.0–0.2)

## 2018-06-16 LAB — BASIC METABOLIC PANEL
Anion gap: 6 (ref 5–15)
BUN: 42 mg/dL — ABNORMAL HIGH (ref 8–23)
CALCIUM: 8.8 mg/dL — AB (ref 8.9–10.3)
CO2: 25 mmol/L (ref 22–32)
CREATININE: 1.31 mg/dL — AB (ref 0.44–1.00)
Chloride: 111 mmol/L (ref 98–111)
GFR, EST AFRICAN AMERICAN: 40 mL/min — AB (ref >60)
GFR, EST NON AFRICAN AMERICAN: 34 mL/min — AB (ref >60)
Glucose, Bld: 92 mg/dL (ref 70–99)
Potassium: 4.5 mmol/L (ref 3.5–5.1)
SODIUM: 142 mmol/L (ref 135–145)

## 2018-06-16 LAB — MAGNESIUM: MAGNESIUM: 2.4 mg/dL (ref 1.7–2.4)

## 2018-06-16 NOTE — Consult Note (Signed)
Consultation Note Date: 06/16/2018   Patient Name: Jennifer Bender  DOB: 1924-06-21  MRN: [id number]  Age / Sex: 84 y.o., female  PCP: Default, Provider, MD Referring Physician: Elodia Florence., *  Reason for Consultation: Establishing goals of care  HPI/Patient Profile: 84 y.o. female admitted on 06/13/2018 from [facility] after reportedly becoming unresponsive in her wheelchair. Staff were about to initiate CPR however patient spontaneously became responsive. She has a past medical history of congestive heart failure, atrial fibrillation, chronic kidney disease stage IV, pacemaker,  Alzheimer's disease, and hypertension.  Patient was recently seen in the ER on yesterday after having a fall where she struck her head.  Images at that time revealed a nondisplaced C4 lamina and spinous process fracture.  Patient was discharged back to facility with an aspirin,.  She was seen by neurosurgery who deemed patient was not a good candidate for surgery given her advanced age and comorbidities.  During her ED course granddaughter was at the bedside and provided history.  Patient has a history of dementia and unable to provide medical information.  Family reported patient mental status is at baseline.  Patient denies chest pain, shortness of breath, nausea, vomiting, and dizziness to admitting provider.  Vital signs are stable.  CBG 116.  Creatinine 1.5, troponin 0 0.03, UA negative for UTI, all other labs unremarkable.  Head CT showed a new finding of a small amount of layering blood in the posterior horn of the left lateral ventricle.  Since admission patient continues on home medication regimen.  Family verbalizes patient is sensitive to pain medications which causes hallucinations and would not want patient to receive any forms of narcotics if possible.  Patient continues to wear Aspen collar as ordered.  Palliative  medicine team consulted for goals of care discussion.  Clinical Assessment and Goals of Care: I have reviewed medical records including lab results, imaging, Epic notes, and MAR, received report from the bedside RN, and assessed the patient. I then met at the bedside with patient's daughter Jennifer Bender and her daughter to discuss diagnosis prognosis, GOC, EOL wishes, disposition and options.  Patient is alert and oriented to herself and family only.  She is unable to engage in goals of care discussion appropriately.  I also was able to have goals of care discussion with patient's son Jennifer Bender.  I introduced Palliative Medicine as specialized medical care for people living with serious illness. It focuses on providing relief from the symptoms and stress of a serious illness. The goal is to improve quality of life for both the patient and the family.  We discussed a brief life review of the patient.  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  Her husband passed away several years ago.  Patient has 2 children who are both actively involved in her care.  Daughter states patient is a resident at [facility] in their memory care unit.  As far as functional and nutritional status family reports noticing a continuous decline in patient's over the past 6  to 9 months.  Daughter reports patient was officially diagnosed by her provider back in May with Alzheimer's.  At that time they had noticed patient beginning to have aggressive behaviors and frequent falls.  Since her diagnosis patient has continued to follow and have been in and out of hospital in the ED for complications related to falls such as broken bones.  Over the past month family reports patient has been wheelchair-bound due to her instability in her gait and progressive weakness.  Daughter reports that patient has lost a significant amount of weight and she wants weight 175 and most recently 1 weight patient was 98 pounds.  Daughter feels majority of his  weight has been lost in the past 6 to 9 months as patient's appetite waxes and wanes.  She reports patient would often eat one large meal a day and refuse other meals and at times she may eat during all meals but only eat small bites. She is incontinent at times and also has behavioral disturbances at times due to dementia process.   We discussed her current illness and what it means in the larger context of her on-going co-morbidities.  Natural disease trajectory and expectations at EOL were discussed. Family verbalized awareness of current illness and co-morbidities. We discussed specifically her Alzheimer's, functional and nutritional status, and her safety in relation to continuous falls. Both Jennifer Bender and Jennifer Bender expressed that they felt their mother has continued to show a decline over the past year but more significantly over the past 2-3 months. They expressed that they remain hopeful given she is a strong woman, but also preparing for the worst given her age and all of factors.   I attempted to elicit values and goals of care important to the patient and family.   The difference between aggressive medical intervention and comfort care was considered in light of the patient's goals of care. At this time family has requested to continue to treat the treatable while hospitalized. They feel that their mother is at the close to the idea of comfort care. Family requesting for consideration to have patient placed at a residential hospice facility. We discussed in detail patients current condition and admission criteria for hospice. Daughter expresses that patient was evaluated 3 months ago for hospice facility and deemed inappropriate given her functional status. We discussed that residential hospice admission requires a prognosis of 2 weeks or less. I explained patient is appropriate for outpatient hospice services however at this time she most likely would not meet requirements for inpatient. Family verbalized  understanding.   Family request given patient is still somewhat functional and PT has recommended SNF placement for safety, that they would like to further consider her being placed for rehab. This would give them time to allow patient time to show signs of improvement mobility wise, with awareness that her mental status will not much improve. Son Jennifer Bender reports CSW at [facility] Maudie Mercury [phone]) has been in conversation with [facility] in [place]. He reports they were considering transferring patient to their facility prior to admission. Advise family that a referral to social work will be placed and she would follow up with family and discuss options.   Advanced directives, concepts specific to code status, artifical feeding and hydration, and rehospitalization were considered and discussed. Jennifer Bender and Jennifer Bender verbalized patient has advanced directives and Jennifer Bender is the documented POA. Family also confirms patient's wishes for DNR/DNI and no forms of artificial feedings such as PEG tube.   Hospice  and Palliative Care services outpatient were explained and offered. At this time family advised given goals of SNF with rehabilitation, patient would be most appropriate for outpatient palliative at the facility. I educated them on the option to transition to hospice care later if patient does not improve or continues to show signs of decline. Family verbalized understanding and expressed wishes to have outpatient palliative. They verbalized awareness of communicating with outpatient team for assistance to transfer to hospice when ready.   Questions and concerns were addressed.  Hard Choices booklet left for review. The family was encouraged to call with questions or concerns.  PMT will continue to support holistically.  Primary Decision Maker: Liston Alba HCPOA/SON     SUMMARY OF RECOMMENDATIONS    DNR/DNI-as confirmed by patient  Continue to treat the treatable while hospitalized without  escalation of care.   Family requesting residential hospice. Detailed discussion and education regarding admission criteria. Family aware patient is appropriate for outpatient hospice.   At this time family requesting patient be considered for SNF w/rehab and outpatient palliative. Family reports CSW at [facility] have been in contact prior to admission with [facility] for possible transfer. Son is hopeful patient can be sent to [facility].   CSW referral for SNF and outpatient palliative.   Palliative Medicine team will continue to support patient, family, and medical team during hospitalization as needed.   Code Status/Advance Care Planning:  DNR  Palliative Prophylaxis:   Aspiration, Bowel Regimen, Delirium Protocol, Frequent Pain Assessment and Turn Reposition  Additional Recommendations (Limitations, Scope, Preferences):  Full Scope Treatment-continue to treat the treatable without escalation of care.   Psycho-social/Spiritual:   Desire for further Chaplaincy support:NO  Additional Recommendations: Caregiving  Support/Resources and Education on Hospice  Prognosis:   Guarded to Poor in the setting of advanced Alzheimer's, poor po intake, deconditioning, frequent falls, s/p LOC episode, hypertension, CHF, atrial fibrillation, and pacemaker.   Discharge Planning: [place] for rehab with Palliative care service follow-up      Primary Diagnoses: Present on Admission: . Syncope   I have reviewed the medical record, interviewed the patient and family, and examined the patient. The following aspects are pertinent.  Past Medical History:  Diagnosis Date  . Arthritis   . Atrial fibrillation ([place])   . CHF (congestive heart failure) ([place])   . Hypertension   . Pneumonia   . UTI (lower urinary tract infection)    Social History   Socioeconomic History  . Marital status: Married    Spouse name: Not on file  . Number of children: Not on file  .  Years of education: Not on file  . Highest education level: Not on file  Occupational History  . Not on file  Social Needs  . Financial resource strain: Not on file  . Food insecurity:    Worry: Not on file    Inability: Not on file  . Transportation needs:    Medical: Not on file    Non-medical: Not on file  Tobacco Use  . Smoking status: Never Smoker  . Smokeless tobacco: Never Used  Substance and Sexual Activity  . Alcohol use: No  . Drug use: No  . Sexual activity: Not on file  Lifestyle  . Physical activity:    Days per week: Not on file    Minutes per session: Not on file  . Stress: Not on file  Relationships  . Social connections:    Talks on phone: Not on  file    Gets together: Not on file    Attends religious service: Not on file    Active member of club or organization: Not on file    Attends meetings of clubs or organizations: Not on file    Relationship status: Not on file  Other Topics Concern  . Not on file  Social History Narrative  . Not on file   History reviewed. No pertinent family history. Scheduled Meds: . acetaminophen  650 mg Oral Q6H  . diltiazem  180 mg Oral Daily  . DULoxetine  30 mg Oral Daily  . feeding supplement (ENSURE ENLIVE)  237 mL Oral BID BM  . levothyroxine  88 mcg Oral QAC breakfast   Continuous Infusions: PRN Meds:.magnesium hydroxide, Melatonin, traMADol Medications Prior to Admission:  Prior to Admission medications   Medication Sig Start Date End Date Taking? Authorizing Provider  acetaminophen (TYLENOL) 325 MG tablet Take 650 mg by mouth 4 (four) times daily as needed for mild pain.    Yes [provider]  diltiazem (DILACOR XR) 180 MG 24 hr capsule Take 180 mg by mouth daily.   Yes [provider]  DULoxetine (CYMBALTA) 30 MG capsule Take 30 mg by mouth daily.   Yes [provider]  fish oil-omega-3 fatty acids 1000 MG capsule Take 1 g by mouth daily.    Yes [provider]    levothyroxine (SYNTHROID, LEVOTHROID) 88 MCG tablet Take 88 mcg by mouth daily before breakfast.   Yes [provider]  Melatonin 5 MG TABS Take 5 mg by mouth at bedtime as needed (sleep).   Yes [provider]  Menthol, Topical Analgesic, (BLUE GEL) 2 % GEL Apply 1 application topically 3 (three) times daily.   Yes [provider]  Multiple Vitamin (DAILY VITE PO) Take 1 tablet by mouth daily.   Yes [provider]  potassium chloride (K-DUR,KLOR-CON) 10 MEQ tablet Take 10 mEq by mouth daily.   Yes [provider]  Skin Protectants, Misc. (EUCERIN) cream Apply 1 application topically at bedtime.   Yes [provider]  torsemide (DEMADEX) 20 MG tablet Take 40 mg by mouth 2 (two) times daily.    Yes [provider]   Allergies  Allergen Reactions  . Ace Inhibitors     Angioedema   . Colchicine     unknown  . Crestor [Rosuvastatin Calcium]     Myalgias   . Ezetimibe Other (See Comments)  . Hydrocodone-Acetaminophen Nausea Only  . Lipitor [Atorvastatin Calcium]     Myalgias   . Morphine And Related Other (See Comments)    Unknown  . Nsaids Nausea And Vomiting    Renal insufficiency  Renal sufficient  . Pravachol Nausea Only  . Rosuvastatin Other (See Comments)    Unknown  . Shellfish Allergy Other (See Comments)  . Tolmetin Other (See Comments)    Renal insufficiency   . Tramadol Nausea Only  . Zocor [Simvastatin]     Myalgias    Review of Systems  Unable to perform ROS: Dementia    Physical Exam  Constitutional: Vital signs are normal. She is cooperative. She appears ill.  Thin and frail appearing  HENT:  Head: Head is with laceration.  Left occipital staples s/p fall, left forehead hematoma s/p fall  Eyes: Lids are normal. Left pupil is not round.  Left pupil oval and reactive   Neck:  Aspen collar in place   Cardiovascular: Normal heart sounds and normal pulses. A  regularly irregular rhythm present.   Pulmonary/Chest: Effort normal. She has decreased breath sounds.  Abdominal: Soft. Normal appearance and bowel sounds are normal.  Musculoskeletal:  weakness  Neurological: She is alert. She is disoriented.  Alert to self and family   Skin: Skin is warm, dry and intact. Bruising noted.  Psychiatric: Cognition and memory are impaired. She expresses inappropriate judgment.  Hx of Alzheimer's with some agitation at times  Nursing note and vitals reviewed.   Vital Signs: BP 131/65   Pulse 66   Temp 97.6 F (36.4 C) (Oral)   Resp 18   Ht _0  (1.676 m)   Wt 52 kg   SpO2 98%   BMI 18.50 kg/m  Pain Scale: 0-10   Pain Score: 0-No pain   SpO2: SpO2: 98 % O2 Device:SpO2: 98 % O2 Flow Rate: .   IO: Intake/output summary: No intake or output data in the 24 hours ending 06/16/18 2212  LBM: Last BM Date: (pta, pt confused) Baseline Weight: Weight: 49 kg Most recent weight: Weight: 52 kg     Palliative Assessment/Data: PPS 30%    Time In: 1230 Time Out: 1345 Time Total:75 min.   Greater than 50%  of this time was spent counseling and coordinating care related to the above assessment and plan.  Signed by:  Alda Lea, AGPCNP-BC Palliative Medicine Team  Phone: [phone] Fax: [phone] Pager: [phone] Amion: Bjorn Pippin    Please contact Palliative Medicine Team phone at [phone] for questions and concerns.  For individual provider: See Shea Evans

## 2018-06-16 NOTE — Progress Notes (Signed)
Patient resting comfortably during shift report. Denies complaints. Pts Aspen collar adjusted, mittens not in place.  Per night shift pt became combative at points during the night.

## 2018-06-16 NOTE — Plan of Care (Signed)
  Problem: Health Behavior/Discharge Planning: Goal: Ability to manage health-related needs will improve Outcome: Progressing   Problem: Clinical Measurements: Goal: Ability to maintain clinical measurements within normal limits will improve Outcome: Progressing   Problem: Clinical Measurements: Goal: Cardiovascular complication will be avoided Outcome: Progressing   

## 2018-06-16 NOTE — Consult Note (Addendum)
Neurology Consultation  Reason for Consult: Irregular left pupil Referring Physician: Dr. Lowell Guitar  CC: Irregular left pupil  History is obtained from: Chart review  HPI: Jennifer Bender is a 84 y.o. female past medical history of atrial fibrillation, pacemaker, CKD 4, congestive heart failure, hypertension presented on 06/14/2018 with a syncopal event at her facility when she is in a wheelchair and became unresponsive.  Staff loaded to the ground and were about to start CPR when she started responding.  She was seen the day prior with a fall where she had struck her head and had a nondisplaced C4 laminar and spinous process fracture and discharged back to the facility with a c-collar. Neurosurgery had evaluated the patient and deemed that she is not a good surgical candidate and should require conservative management. Neurology was consulted today because her left pupil appeared irregular. There were no other new concerning neurological findings that were focal other than the pupillary abnormality. Upon chart review, from 2016 ophthalmology exam at [facility], patient had bilaterally reactive pupils with the left pupil being irregular. She has had cataract surgeries done in the past.  Patient is not able to provide any reliable history I presume she has some degree of cognitive impairment.  ICH Score: 2-for age and IVH   NFA:OZHYQM to obtain due to altered mental status.   Past Medical History:  Diagnosis Date  . Arthritis   . Atrial fibrillation ([facility])   . CHF (congestive heart failure) ([facility])   . Hypertension   . Pneumonia   . UTI (lower urinary tract infection)    History reviewed. No pertinent family history.  Social History:   reports that she has never smoked. She has never used smokeless tobacco. She reports that she does not drink alcohol or use drugs.  Medications  Current Facility-Administered Medications:  .  acetaminophen (TYLENOL) tablet 650 mg, 650 mg, Oral, Q6H,  Zigmund Daniel., MD, 650 mg at 06/16/18 1847 .  diltiazem (CARDIZEM CD) 24 hr capsule 180 mg, 180 mg, Oral, Daily, John Giovanni, MD, 180 mg at 06/16/18 1134 .  DULoxetine (CYMBALTA) DR capsule 30 mg, 30 mg, Oral, Daily, John Giovanni, MD, 30 mg at 06/16/18 1134 .  feeding supplement (ENSURE ENLIVE) (ENSURE ENLIVE) liquid 237 mL, 237 mL, Oral, BID BM, John Giovanni, MD, 237 mL at 06/16/18 1333 .  levothyroxine (SYNTHROID, LEVOTHROID) tablet 88 mcg, 88 mcg, Oral, QAC breakfast, John Giovanni, MD, 88 mcg at 06/16/18 0539 .  magnesium hydroxide (MILK OF MAGNESIA) suspension 30 mL, 30 mL, Oral, Daily PRN, Zigmund Daniel., MD .  Melatonin TABS 3 mg, 3 mg, Oral, QHS PRN, John Giovanni, MD .  traMADol (ULTRAM) tablet 50 mg, 50 mg, Oral, Q6H PRN, John Giovanni, MD, 50 mg at 06/14/18 1155  Exam: Current vital signs: BP 131/65   Pulse 66   Temp 97.6 F (36.4 C) (Oral)   Resp 18   Ht 5\' 6"  (1.676 m)   Wt 52 kg   SpO2 98%   BMI 18.50 kg/m  Vital signs in last 24 hours: Temp:  [97.4 F (36.3 C)-97.8 F (36.6 C)] 97.6 F (36.4 C) (10/27 1949) Pulse Rate:  [66-73] 66 (10/27 1949) Resp:  [18] 18 (10/27 1949) BP: (131-158)/(65-87) 131/65 (10/27 1949) SpO2:  [93 %-100 %] 98 % (10/27 1949) Weight:  [52 kg] 52 kg (10/27 0428) General: Awake alert in no distress HEENT: C-collar in place, mild tenderness on neck palpation, clear nares and throat. CVS: Irregularly irregular  Respiratory: Clear to auscultation Extremities: Warm well perfused Neurological exam exam patient awake, alert, oriented to self.  Not oriented to place or time. She has a poor attention concentration. She has 0/3 registration and recall. Her speech is clear.  Her naming is impaired.  Comp attention is impaired.  Repetition is impaired. Cranial nerves: Right pupil is round reactive to light, left pupil is mildly irregular-appears surgical but is reactive to light, no APD, visual fields  are full, extra ocular movements are intact, face is symmetric, auditory acuity is mildly reduced bilaterally, shoulder shrug intact, tongue midline. Motor exam: Antigravity without vertical drift in all 4 extremities Sensory exam: Intact to touch all over Coordination: Difficult to assess as the patient was agitated and had to be put in wrist mittens but based on gross movements, no dysmetria. Gait testing was deferred at this time.  1a Level of Conscious.: 0 1b LOC Questions: 1 1c LOC Commands: 0 2 Best Gaze: 0 3 Visual: 0 4 Facial Palsy: 0 5a Motor Arm - left: 0 5b Motor Arm - Right: 0 6a Motor Leg - Left: 0 6b Motor Leg - Right: 0 7 Limb Ataxia: 0 8 Sensory: 0 9 Best Language: 1 10 Dysarthria: 0 11 Extinct. and Inatten.:0  TOTAL: 2   Labs I have reviewed labs in epic and the results pertinent to this consultation are:  CBC    Component Value Date/Time   WBC 9.2 06/16/2018 0627   RBC 3.29 (L) 06/16/2018 0627   HGB 10.3 (L) 06/16/2018 0627   HCT 32.9 (L) 06/16/2018 0627   PLT 184 06/16/2018 0627   MCV 100.0 06/16/2018 0627   MCH 31.3 06/16/2018 0627   MCHC 31.3 06/16/2018 0627   RDW 12.8 06/16/2018 0627   LYMPHSABS 0.8 06/13/2018 1829   MONOABS 0.8 06/13/2018 1829   EOSABS 0.1 06/13/2018 1829   BASOSABS 0.1 06/13/2018 1829   CMP     Component Value Date/Time   NA 142 06/16/2018 0627   K 4.5 06/16/2018 0627   CL 111 06/16/2018 0627   CO2 25 06/16/2018 0627   GLUCOSE 92 06/16/2018 0627   BUN 42 (H) 06/16/2018 0627   CREATININE 1.31 (H) 06/16/2018 0627   CALCIUM 8.8 (L) 06/16/2018 0627   GFRNONAA 34 (L) 06/16/2018 0627   GFRAA 40 (L) 06/16/2018 1610   Imaging I have reviewed the images obtained:  CT-scan of the brain-06/16/2018- small amount of blood in the occipital horn of the left lateral ventricle.  No new hemorrhage.  Stable atrophy and chronic small vessel change.  Assessment:  84 year old with past medical history as above with syncope/fall with  C-spine fracture being managed conservatively noted to have a possible irregular left pupil. On further evaluation of notes, has had bilateral cataract surgeries and has documented irregular left pupil on off neurological exam and care everywhere from [facility] ophthalmology. She does have evidence of cognitive impairment on bedside testing but this is not a fair and complete evaluation of cognition. I was unable to reach family to ascertain what her baseline cognition is. Based on my exam, she has no focal abnormalities. Her CT scan is suggestive of a small interventricular hemorrhage in the occipital horn with stable atrophy and chronic small vessel changes. I do not think that her pupillary findings represent an acute change or are concerning at this time.  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 - Intra-ventricular hemorrhage-likely posttraumatic -Syncope- etiology under investigation, likely cardia genic - Toxic metabolic encephalopathy - Likely  underlying dementia/cognitive impairment  Recommendations: Continue to hold antiplatelets and anticoagulants Repeat CT head in 1 week.  Follow-up with neurosurgery if needed as the bleed was likely post traumatic. Management of C-spine fracture per neurosurgery Syncope work-up per primary team as you are. Correction of toxic medical derangements per primary team as you are  Please call neurology as needed.  -- Milon Dikes, MD [facility] Pager: [phone] If 7pm to 7am, please call on call as listed on AMION.

## 2018-06-16 NOTE — Progress Notes (Signed)
PROGRESS NOTE    Jennifer Bender  ZOX:[id number] DOB: 26-Nov-1923 DOA: 06/13/2018 PCP: Default, Provider, MD   Brief Narrative:  Jennifer Bender is Jennifer Bender 84 y.o. female with medical history significant of atrial fibrillation, PPM, CKD IV, congestive heart failure, hypertension resenting to the ED after syncopal event.  Per chart, patient was in her wheelchair at [facility] place and went unresponsive in her wheelchair.. Staff lowered her to the ground and were about to start CPR when she started responding.  Patient was here yesterday after sustaining Crew Goren fall where she struck her head and had Shona Pardo nondisplaced C4 lamina and spinous process fracture.  She was discharged back to the facility with an Aspen collar.  Patient was seen by neurosurgery and they felt that due to the patient's age and comorbidities she would not be Kaida Games good candidate for surgery.   Granddaughter at bedside stated patient was found slumped over in her wheelchair at the nursing home.  EMS was called and before they could do CPR patient regained consciousness.  Patient denies having any chest pain, shortness of breath, or dizziness.  Denies having any nausea or vomiting.  States her neck pain is better after receiving Stevie Ertle pain medication.  Granddaughter states that patient has Jermaine Tholl pacemaker and the family will contact her cardiologist in [place] to get more information.  States that the patient has previously expressed her desire to be DNR.  States patient should not be given narcotics as she is very sensitive and starts hallucinating.  Granddaughter believes patient's mental status is at baseline.  Assessment & Plan:   Principal Problem:   Syncope Active Problems:   Atrial fibrillation (HCC)   CKD (chronic kidney disease) stage 4, GFR 15-29 ml/min (HCC)   CHF (congestive heart failure) (HCC)   HTN (hypertension)   C4 cervical fracture (HCC)   Hypothyroidism   Malnutrition of moderate degree   Intraventricular Hemorrhage: suspect related  to recent head trauma.  No focal deficits on exam.  Dr. Wynetta Emery recommends BP control and if neuro sx, c/s neurology. Neuro checks Today noted irregular L pupil, not responsive to light.  She's Daimien Patmon&Ox2 on exam and without other focal deficits on my exam.  No hx of surgery and on discussion with family, they noticed it last night and it is new.  Will plan for stat repeat CT scan and neurology consult.  Syncope Vitals stable on presentation.   CBG 116.  No leukocytosis.  Hemoglobin normal.   EKG with atrial fibrillation, similar to priors UA negative Troponins low and flat CXR with cardiomegaly Head CT showed  Pacemaker interrogated and only notable for 7 beats of RVR on oct 3 Echo with normal EF, elevated PASP (see report) Orthostatics not done Etiology of syncope unclear, but pt appears back to baseline at this time.    Acute Encephalopathy: seems improved at this time.  Still Jatoria Kneeland&Ox2 and confused, but better.  Delirium precautions.  Atrial fibrillation -CHA2DS2VASc 5.  -Currently rate controlled.  Patient has Sholanda Croson pacemaker. -Not on anticoagulation, likely due to fall risk and age  CKD 4 -Stable. Creatinine 1.2; recent baseline 1.4-2.0.  -Continue IVF  CHF -Does not appear volume overloaded on exam.  No prior echo seen in the chart. -Echo as below   Hypertension -Continue home Cardizem  Nondisplaced C4 lamina fracture -Seen on CT done June 12, 2018. -Aspen collar -Tramadol as needed pain -Avoid narcotics.  Patient is sensitive and has hallucinated in the past when given narcotics.  Schedule APAP. -  Needs to follow with Dr. Maurice Small as outpatient  Hypothyroidism -Continue home Synthroid  Wheezing: cxr without edema or consolidation  Anemia: Hb decreased from 14.3 to 10.7.  No si/sx bleeding.  Suspect this was due to aggressive IVF yesterday.  Follow.  Goals of care: apparently hospice was going to come out to see Jennifer Bender at [facility] yesterday.  Granddaughter  expresses interest in talking to palliative care here.  Will place c/s, but discussed that they may not see pt before she discharges, as it's weekend, but if she discharges prior to palliative care c/s, will recommend palliative to follow as outpatient.    DVT prophylaxis: SCD Code Status: DNR Family Communication: granddaughter Disposition Plan: pending further w/u for new focal neurologic symptoms. SNF placement   Consultants:   neurosurgery  Procedures:  Echo Study Conclusions  - Left ventricle: The cavity size was normal. Wall thickness was   increased in Audrionna Lampton pattern of mild LVH. Systolic function was normal.   The estimated ejection fraction was in the range of 60% to 65%. - Mitral valve: Mildly to moderately calcified annulus. Mildly   thickened leaflets . There was mild regurgitation. - Left atrium: The atrium was moderately dilated. - Right ventricle: The cavity size was mildly dilated. Systolic   function was mildly reduced. - Right atrium: The atrium was severely dilated. - Tricuspid valve: There was moderate regurgitation. - Pulmonary arteries: PA peak pressure: 65 mm Hg (S).  Antimicrobials:  Anti-infectives (From admission, onward)   None         Subjective: No complaints. Jennifer Bender&Ox2.  No hx of abnormal pupil that she reports.   Objective: Vitals:   06/15/18 1050 06/15/18 1306 06/15/18 2022 06/16/18 0428  BP: 128/62 (!) 110/54 (!) 100/48 138/87  Pulse:  67 (!) 101 68  Resp:  18 18 18   Temp:  98.2 F (36.8 C) 98.7 F (37.1 C) 97.8 F (36.6 C)  TempSrc:  Oral Oral Oral  SpO2:  (!) 77% 95% 93%  Weight:    52 kg  Height:        Intake/Output Summary (Last 24 hours) at 06/16/2018 1206 Last data filed at 06/15/2018 2100 Gross per 24 hour  Intake 240 ml  Output 100 ml  Net 140 ml   Filed Weights   06/14/18 0012 06/15/18 0509 06/16/18 0428  Weight: 49 kg 47 kg 52 kg    Examination:  General: No acute distress. HEENT: L pupil teardrop shaped,  nonreactive to light Cardiovascular: Heart sounds show Jennifer Bender regular rate, and rhythm Abdomen: Soft, nontender, nondistended  Neurological: Alert and oriented 2. Moves all extremities 4 with equal strength. Cranial nerves II through XII intact except for nonresponsive L pupil and nystagmus to that L eye as well. Skin: Warm and dry. No rashes or lesions. Extremities: No clubbing or cyanosis. No edema.  Psychiatric: Mood and affect are normal. Insight and judgment are impaired.     Data Reviewed: I have personally reviewed following labs and imaging studies  CBC: Recent Labs  Lab 06/12/18 0659 06/13/18 1829 06/15/18 0635 06/16/18 0627  WBC 9.5 7.0 6.9 9.2  NEUTROABS  --  5.3  --   --   HGB 12.9 14.3 10.7* 10.3*  HCT 41.2 47.2* 34.8* 32.9*  MCV 100.2* 100.4* 101.2* 100.0  PLT 225 177 197 184   Basic Metabolic Panel: Recent Labs  Lab 06/12/18 0659 06/13/18 1829 06/15/18 0635 06/16/18 0627  NA 141 144 144 142  K 3.9 3.9 4.5 4.5  CL  101 107 112* 111  CO2 31 27 27 25   GLUCOSE 100* 121* 101* 92  BUN 66* 53* 37* 42*  CREATININE 1.42* 1.55* 1.20* 1.31*  CALCIUM 8.9 8.6* 8.9 8.8*  MG  --   --  2.2 2.4   GFR: Estimated Creatinine Clearance: 22.5 mL/min (Vivion Romano) (by C-G formula based on SCr of 1.31 mg/dL (H)). Liver Function Tests: No results for input(s): AST, ALT, ALKPHOS, BILITOT, PROT, ALBUMIN in the last 168 hours. No results for input(s): LIPASE, AMYLASE in the last 168 hours. No results for input(s): AMMONIA in the last 168 hours. Coagulation Profile: No results for input(s): INR, PROTIME in the last 168 hours. Cardiac Enzymes: Recent Labs  Lab 06/13/18 1829 06/14/18 0623 06/14/18 1154 06/14/18 1743  TROPONINI 0.03* 0.03* 0.03* <0.03   BNP (last 3 results) No results for input(s): PROBNP in the last 8760 hours. HbA1C: No results for input(s): HGBA1C in the last 72 hours. CBG: Recent Labs  Lab 06/13/18 1827  GLUCAP 116*   Lipid Profile: No results for  input(s): CHOL, HDL, LDLCALC, TRIG, CHOLHDL, LDLDIRECT in the last 72 hours. Thyroid Function Tests: No results for input(s): TSH, T4TOTAL, FREET4, T3FREE, THYROIDAB in the last 72 hours. Anemia Panel: No results for input(s): VITAMINB12, FOLATE, FERRITIN, TIBC, IRON, RETICCTPCT in the last 72 hours. Sepsis Labs: No results for input(s): PROCALCITON, LATICACIDVEN in the last 168 hours.  Recent Results (from the past 240 hour(s))  MRSA PCR Screening     Status: None   Collection Time: 06/14/18  2:01 AM  Result Value Ref Range Status   MRSA by PCR NEGATIVE NEGATIVE Final    Comment:        The GeneXpert MRSA Assay (FDA approved for NASAL specimens only), is one component of Lenise Jr comprehensive MRSA colonization surveillance program. It is not intended to diagnose MRSA infection nor to guide or monitor treatment for MRSA infections. Performed at [facility], [address] [place], [place] [address]          Radiology Studies: Dg Chest Port 1 View  Result Date: 06/14/2018 CLINICAL DATA:  Lung crackles EXAM: PORTABLE CHEST 1 VIEW COMPARISON:  06/13/2018 FINDINGS: Dual-chamber pacer leads from the left are in stable position. Chronic cardiomegaly. There is no edema, consolidation, effusion, or pneumothorax. Stable interstitial coarsening. Artifact from overlapping hardware. IMPRESSION: Cardiomegaly without failure. Electronically Signed   By: Marnee Spring M.D.   On: 06/14/2018 19:25        Scheduled Meds: . acetaminophen  650 mg Oral Q6H  . diltiazem  180 mg Oral Daily  . DULoxetine  30 mg Oral Daily  . feeding supplement (ENSURE ENLIVE)  237 mL Oral BID BM  . levothyroxine  88 mcg Oral QAC breakfast   Continuous Infusions:   LOS: 0 days    Time spent: over 30 min    Lacretia Nicks, MD [facility] Pager [phone]  If 7PM-7AM, please contact night-coverage [email or web address] Password [email or web address] 06/16/2018, 12:06 PM

## 2018-06-17 ENCOUNTER — Observation Stay (HOSPITAL_COMMUNITY): Payer: Medicare Other

## 2018-06-17 DIAGNOSIS — R55 Syncope and collapse: Secondary | ICD-10-CM | POA: Diagnosis not present

## 2018-06-17 LAB — BASIC METABOLIC PANEL
Anion gap: 4 — ABNORMAL LOW (ref 5–15)
BUN: 41 mg/dL — AB (ref 8–23)
CALCIUM: 8.9 mg/dL (ref 8.9–10.3)
CO2: 27 mmol/L (ref 22–32)
CREATININE: 1.22 mg/dL — AB (ref 0.44–1.00)
Chloride: 111 mmol/L (ref 98–111)
GFR calc non Af Amer: 37 mL/min — ABNORMAL LOW (ref >60)
GFR, EST AFRICAN AMERICAN: 43 mL/min — AB (ref >60)
Glucose, Bld: 99 mg/dL (ref 70–99)
Potassium: 4.5 mmol/L (ref 3.5–5.1)
SODIUM: 142 mmol/L (ref 135–145)

## 2018-06-17 LAB — CBC
HCT: 34 % — ABNORMAL LOW (ref 36.0–46.0)
Hemoglobin: 10.5 g/dL — ABNORMAL LOW (ref 12.0–15.0)
MCH: 31 pg (ref 26.0–34.0)
MCHC: 30.9 g/dL (ref 30.0–36.0)
MCV: 100.3 fL — ABNORMAL HIGH (ref 80.0–100.0)
PLATELETS: 176 10*3/uL (ref 150–400)
RBC: 3.39 MIL/uL — ABNORMAL LOW (ref 3.87–5.11)
RDW: 12.8 % (ref 11.5–15.5)
WBC: 8.6 10*3/uL (ref 4.0–10.5)
nRBC: 0 % (ref 0.0–0.2)

## 2018-06-17 LAB — HEPATIC FUNCTION PANEL
ALT: 15 U/L (ref 0–44)
AST: 20 U/L (ref 15–41)
Albumin: 2.9 g/dL — ABNORMAL LOW (ref 3.5–5.0)
Alkaline Phosphatase: 130 U/L — ABNORMAL HIGH (ref 38–126)
BILIRUBIN DIRECT: 0.1 mg/dL (ref 0.0–0.2)
BILIRUBIN INDIRECT: 0.4 mg/dL (ref 0.3–0.9)
TOTAL PROTEIN: 5.7 g/dL — AB (ref 6.5–8.1)
Total Bilirubin: 0.5 mg/dL (ref 0.3–1.2)

## 2018-06-17 LAB — MAGNESIUM: MAGNESIUM: 2.4 mg/dL (ref 1.7–2.4)

## 2018-06-17 LAB — LIPASE, BLOOD: LIPASE: 38 U/L (ref 11–51)

## 2018-06-17 MED ORDER — TRAZODONE HCL 50 MG PO TABS
25.0000 mg | ORAL_TABLET | Freq: Every day | ORAL | Status: DC
  Administered 2018-06-17: 25 mg via ORAL
  Filled 2018-06-17: qty 1

## 2018-06-17 MED ORDER — TRAZODONE HCL 50 MG PO TABS: 25.0000 mg | ORAL_TABLET | Freq: Every day | ORAL | 0 refills | Status: DC

## 2018-06-17 MED ORDER — ENSURE ENLIVE PO LIQD: 237.0000 mL | Freq: Two times a day (BID) | ORAL | 12 refills | Status: AC

## 2018-06-17 MED ORDER — POLYETHYLENE GLYCOL 3350 17 G PO PACK: 17.0000 g | PACK | Freq: Every day | ORAL | Status: DC

## 2018-06-17 MED ORDER — POLYETHYLENE GLYCOL 3350 17 G PO PACK: 17.0000 g | PACK | Freq: Every day | ORAL | 0 refills | Status: AC

## 2018-06-17 MED ORDER — POLYETHYLENE GLYCOL 3350 17 G PO PACK
17.0000 g | PACK | Freq: Two times a day (BID) | ORAL | Status: DC
  Administered 2018-06-17 – 2018-06-19 (×4): 17 g via ORAL
  Filled 2018-06-17 (×5): qty 1

## 2018-06-17 NOTE — Progress Notes (Addendum)
Noticed a bulge on right side of abdomen during assessment.   Notified MD  MD placed order for Miralax.  Documented BM yesterday.    X-ray show large stool burden.  MD ordered a disimpaction via enema

## 2018-06-17 NOTE — Clinical Social Work Note (Addendum)
Called patient's son and confirmed preference for [facility] for [facility]. CSW sent referral and left voicemail for hospital liaison.  Charlynn Court, CSW [phone]  10:36 am Faxed clinicals to [facility] for authorization review. Still waiting on call back from SNF hospital liaison.  Charlynn Court, CSW [phone]  12:33 pm Received insurance approval for SNF placement. Just need to assign to facility. Still waiting on bed offer decision from [facility]. Spoke with hospital liaison earlier this morning and she said she would have nursing review referral. CSW left message to check status.  Charlynn Court, CSW [phone]  2:19 pm [facility] a bed. MD aware. Son has been notified and will go to facility around 5:00 to complete admissions paperwork. Left message for pre-service coordinator at [facility] to assign authorization to this facility.  Charlynn Court, CSW [phone]  2:57 pm Still waiting on call back from preservice coordinator but customer service did assign auth to [employer].   Charlynn Court, CSW [phone]  3:22 pm Authorization approved: [id number].   Charlynn Court, CSW [phone]  4:31 pm Discharge postponed until tomorrow due to abdominal distension. MD has notified patient's son. CSW left him a voicemail to let him know to still go to SNF to complete admissions paperwork.   Charlynn Court, CSW [phone]

## 2018-06-17 NOTE — Progress Notes (Signed)
Patient has taken aspen collar off multiple time during the day.   Talked patient granddaughter, was going to try to get another family to stay the night so we do not have to use restraints over the night.   Need to stay 24 hours free of restraints so patient can go to SNF tomorrow .

## 2018-06-17 NOTE — Progress Notes (Signed)
Patient had a BM- but bulge is still very noticeable.   Asked MD to come assess prior to discharge to SNF today.     MD assess-  Ordered abdominal xray.  Patient will stay once more night

## 2018-06-17 NOTE — Progress Notes (Signed)
PROGRESS NOTE    Jennifer Bender  ZOX:[id number] DOB: September 15, 1923 DOA: 06/13/2018 PCP: Default, Provider, MD   Brief Narrative:  Jennifer Bender is Jennifer Bender 84 y.o. female with medical history significant of atrial fibrillation, PPM, CKD IV, congestive heart failure, hypertension resenting to the ED after syncopal event.  Per chart, patient was in her wheelchair at [facility] place and went unresponsive in her wheelchair.. Staff lowered her to the ground and were about to start CPR when she started responding.  Patient was here yesterday after sustaining Jennifer Bender fall where she struck her head and had Jennifer Bender nondisplaced C4 lamina and spinous process fracture.  She was discharged back to the facility with an Aspen collar.  Patient was seen by neurosurgery and they felt that due to the patient's age and comorbidities she would not be Jennifer Bender good candidate for surgery.   Granddaughter at bedside stated patient was found slumped over in her wheelchair at the nursing home.  EMS was called and before they could do CPR patient regained consciousness.  Patient denies having any chest pain, shortness of breath, or dizziness.  Denies having any nausea or vomiting.  States her neck pain is better after receiving Jennifer Bender pain medication.  Granddaughter states that patient has Jennifer Bender pacemaker and the family will contact her cardiologist in [place] to get more information.  States that the patient has previously expressed her Jennifer Bender.  States patient should not be given narcotics as she is very sensitive and starts hallucinating.  Granddaughter believes patient's mental status is at baseline.  Assessment & Plan:   Principal Problem:   Syncope Active Problems:   Atrial fibrillation (HCC)   CKD (chronic kidney disease) stage 4, GFR 15-29 ml/min (HCC)   CHF (congestive heart failure) (HCC)   HTN (hypertension)   C4 cervical fracture (HCC)   Hypothyroidism   Malnutrition of moderate degree   Intraventricular Hemorrhage: suspect related  to recent head trauma. No focal deficits on exam. Dr. Wynetta Emery recommends BP control and if neuro sx, c/s neurology. Neuro checks irregular L pupil noted on 10/27.  Repeat head CT unchanged.  Neurology suspects post surgical and chronic.  Recommended repeat head CT in about 1 week, hold antiplatelets/anticoagulants.  Syncope  Etiology unclear.  She does have elevated PASP and mildly reduced RV function.  Recommend cards f/u as outpatient if c/w goals of care.  Vitals stable on presentation.  CBG 116. No leukocytosis. Hemoglobin normal.  EKG with atrial fibrillation, similar to priors UA negative Troponins low and flat CXR with cardiomegaly Head CT showed  Pacemaker interrogated and only notable for 7 beats of RVR on oct 3 Echo with normal EF, elevated PASP (see report) Orthostaticsnot done Etiology of syncope unclear, but pt appears back to baseline at this time.   Acute Encephalopathy  Delirium:fluctuating over the past day or so.  She was [name]&Ox1 on the day of discharge, but alert.  She gets most confused at night and will need continued reorientation/follow up as she's removed her c collar at night with her confusion.   Try trazodone at night Delirium precautions  Abdominal Distension: initially planning for discharge, but nursing noted worsening abdominal distension.  Plain film noted large stool burden concerning for impaction. Disimpaction ordered BID miralax  Atrial fibrillation -CHA2DS2VASc 5.  -Currently rate controlled. Patient has [name]. -Would not anticoagulate due to IVH above and risk of falls  CKD 4 -Stable.Follow outpatient.  CHF -Echo as below - holding torsemide at discharge, follow volume  status and electrolytes closely  Hypertension -Continue home Cardizem  Frequent Falls  Nondisplaced C4 lamina fracture -Seen on CT done June 12, 2018. -Aspen collar - this needs to be in place until follow up with Dr. Maurice Small in 6 weeks -Avoid  narcotics.Patient is sensitive and has hallucinated in the past when given narcotics. Schedule APAP. -Frequent falls recently, pt is [name] high fall risk and will need close monitoring at the facility - needs staples removed at 7-10 days  Hypothyroidism -Continue home Synthroid  Wheezing: cxr without edema or consolidation  Anemia: Hb decreased from 14.3 at presentation to ~10.  I think related to aggressive IVF at admission.  Follow.   Goals of care:Seen by palliative care.  Recommended Bender/DNI, plan for SNF with rehab and outpatient palliative care.   Son [name] is POA [phone]) Daughter [name] [phone]) Granddaughter [name]   DVT prophylaxis: SCD Code Status: Bender Family Communication: granddaughter Disposition Plan: likely SNF tomorrow   Consultants:   neurosurgery  Procedures:  Echo Study Conclusions  - Left ventricle: The cavity size was normal. Wall thickness was   increased in Gardy Montanari pattern of mild LVH. Systolic function was normal.   The estimated ejection fraction was in the range of 60% to 65%. - Mitral valve: Mildly to moderately calcified annulus. Mildly   thickened leaflets . There was mild regurgitation. - Left atrium: The atrium was moderately dilated. - Right ventricle: The cavity size was mildly dilated. Systolic   function was mildly reduced. - Right atrium: The atrium was severely dilated. - Tricuspid valve: There was moderate regurgitation. - Pulmonary arteries: PA peak pressure: 65 mm Hg (S).  Antimicrobials:  Anti-infectives (From admission, onward)   None         Subjective: [name]&Ox1 today. No complaints. [name] at bedside.  Abdominal distension noted by nursing prior to discharge.  Objective: Vitals:   06/16/18 1949 06/17/18 0455 06/17/18 0930 06/17/18 1206  BP: 131/65 131/74 120/69 (!) 149/75  Pulse: 66 66 69 73  Resp: 18 18  20   Temp: 97.6 F (36.4 C) (!) 97.5 F (36.4 C)  97.9 F (36.6  C)  TempSrc: Oral Oral  Oral  SpO2: 98% 98% 99% 99%  Weight:  51 kg    Height:        Intake/Output Summary (Last 24 hours) at 06/17/2018 1903 Last data filed at 06/17/2018 1444 Gross per 24 hour  Intake 480 ml  Output 300 ml  Net 180 ml   Filed Weights   06/15/18 0509 06/16/18 0428 06/17/18 0455  Weight: 47 kg 52 kg 51 kg    Examination:  General: No acute distress. Cardiovascular: Heart sounds show Amahia Madonia regular rate, and rhythm. Lungs: Clear to auscultation bilaterally Abdomen: Soft, distended, and mildly ttp  Neurological: Alert and oriented 1. Moves all extremities 4. Cranial nerves II through XII grossly intact other than irregular L pupil. Skin: hematoma to L forehead and staples to L side of head Extremities: No clubbing or cyanosis. No edema.  Psychiatric: Mood and affect are normal. Insight and judgment are impaired.    Data Reviewed: I have personally reviewed following labs and imaging studies  CBC: Recent Labs  Lab 06/12/18 0659 06/13/18 1829 06/15/18 0635 06/16/18 0627 06/17/18 0533  WBC 9.5 7.0 6.9 9.2 8.6  NEUTROABS  --  5.3  --   --   --   HGB 12.9 14.3 10.7* 10.3* 10.5*  HCT 41.2 47.2* 34.8* 32.9* 34.0*  MCV 100.2* 100.4* 101.2*  100.0 100.3*  PLT 225 177 197 184 176   Basic Metabolic Panel: Recent Labs  Lab 06/12/18 0659 06/13/18 1829 06/15/18 0635 06/16/18 0627 06/17/18 0533  NA 141 144 144 142 142  K 3.9 3.9 4.5 4.5 4.5  CL 101 107 112* 111 111  CO2 31 27 27 25 27   GLUCOSE 100* 121* 101* 92 99  BUN 66* 53* 37* 42* 41*  CREATININE 1.42* 1.55* 1.20* 1.31* 1.22*  CALCIUM 8.9 8.6* 8.9 8.8* 8.9  MG  --   --  2.2 2.4 2.4   GFR: Estimated Creatinine Clearance: 23.7 mL/min (Zaleah Ternes) (by C-G formula based on SCr of 1.22 mg/dL (H)). Liver Function Tests: Recent Labs  Lab 06/17/18 0533  AST 20  ALT 15  ALKPHOS 130*  BILITOT 0.5  PROT 5.7*  ALBUMIN 2.9*   Recent Labs  Lab 06/17/18 0533  LIPASE 38   No results for input(s): AMMONIA  in the last 168 hours. Coagulation Profile: No results for input(s): INR, PROTIME in the last 168 hours. Cardiac Enzymes: Recent Labs  Lab 06/13/18 1829 06/14/18 0623 06/14/18 1154 06/14/18 1743  TROPONINI 0.03* 0.03* 0.03* <0.03   BNP (last 3 results) No results for input(s): PROBNP in the last 8760 hours. HbA1C: No results for input(s): HGBA1C in the last 72 hours. CBG: Recent Labs  Lab 06/13/18 1827  GLUCAP 116*   Lipid Profile: No results for input(s): CHOL, HDL, LDLCALC, TRIG, CHOLHDL, LDLDIRECT in the last 72 hours. Thyroid Function Tests: No results for input(s): TSH, T4TOTAL, FREET4, T3FREE, THYROIDAB in the last 72 hours. Anemia Panel: No results for input(s): VITAMINB12, FOLATE, FERRITIN, TIBC, IRON, RETICCTPCT in the last 72 hours. Sepsis Labs: No results for input(s): PROCALCITON, LATICACIDVEN in the last 168 hours.  Recent Results (from the past 240 hour(s))  MRSA PCR Screening     Status: None   Collection Time: 06/14/18  2:01 AM  Result Value Ref Range Status   MRSA by PCR NEGATIVE NEGATIVE Final    Comment:        The GeneXpert MRSA Assay (FDA approved for NASAL specimens only), is one component of Jary Louvier comprehensive MRSA colonization surveillance program. It is not intended to diagnose MRSA infection nor to guide or monitor treatment for MRSA infections. Performed at [facility], [address], [place], [place] [address]          Radiology Studies: Dg Abd 1 View  Result Date: 06/17/2018 CLINICAL DATA:  Abdominal distension. EXAM: ABDOMEN - 1 VIEW COMPARISON:  Radiographs of May 30, 2018. FINDINGS: Large amount of stool is seen in the sigmoid colon and rectum concerning for impaction. No evidence of other bowel dilatation is noted. Atherosclerosis of abdominal aorta is noted. No abnormal calcifications are noted. IMPRESSION: Large stool burden is noted in the sigmoid colon and rectum concerning for impaction. No other bowel dilatation  is noted. Aortic Atherosclerosis (ICD10-I70.0). Electronically Signed   By: Lupita Raider, M.D.   On: 06/17/2018 18:47   Ct Head Wo Contrast  Result Date: 06/16/2018 CLINICAL DATA:  Left pupil not responsive to light. Follow-up intracranial hemorrhage. EXAM: CT HEAD WITHOUT CONTRAST TECHNIQUE: Contiguous axial images were obtained from the base of the skull through the vertex without intravenous contrast. COMPARISON:  06/13/2018. FINDINGS: Brain: Diffusely enlarged ventricles and subarachnoid spaces. Patchy white matter low density in both cerebral hemispheres. No significant change in Delaine Hernandez small amount of blood in the occipital horn of the left lateral ventricle. No new hemorrhage seen.  No mass or evidence of acute infarction. Vascular: No hyperdense vessel or unexpected calcification. Skull: Mild bilateral hyperostosis frontalis. No skull fracture. Sinuses/Orbits: The right frontal sinus remains completely opacified right anterior ethmoid sinus mucosal thickening and complete opacification of the visualized right maxillary sinus, unchanged. Status post cataract extraction. Other: Celina Shiley left frontal scalp hematoma is slightly smaller. Left posterior scalp skin clips are again demonstrated. IMPRESSION: 1. No significant change in Shaquanna Lycan small amount of blood in the occipital horn of the left lateral ventricle. 2. No new hemorrhage. 3. Stable atrophy and chronic small vessel white matter ischemic changes. 4. Stable chronic sinusitis. Electronically Signed   By: Beckie Salts M.D.   On: 06/16/2018 15:28        Scheduled Meds: . acetaminophen  650 mg Oral Q6H  . diltiazem  180 mg Oral Daily  . DULoxetine  30 mg Oral Daily  . feeding supplement (ENSURE ENLIVE)  237 mL Oral BID BM  . levothyroxine  88 mcg Oral QAC breakfast  . polyethylene glycol  17 g Oral BID  . traZODone  25 mg Oral QHS   Continuous Infusions:   LOS: 0 days    Time spent: over 30 min    Lacretia Nicks, MD [facility] Pager  [phone]  If 7PM-7AM, please contact night-coverage [email or web address] Password [email or web address] 06/17/2018, 7:03 PM

## 2018-06-17 NOTE — [place] FL2 (Signed)
[place] MEDICAID [id number] LEVEL OF CARE SCREENING TOOL     IDENTIFICATION  Patient Name: Jennifer Bender Birthdate: Nov 19, 1923 Sex: female Admission Date (Current Location): 06/13/2018  [facility] and [place] Number:  Producer, television/film/video and Address:  [facility] [place], [address], [place], [place] [address]      Provider Number: [id number]  Attending Physician Name and Address:  Zigmund Daniel., *  Relative Name and Phone Number:  [name]; daughter; [phone]    Current Level of Care: Hospital Recommended Level of Care: Skilled Nursing Facility(with palliative) Prior Approval Number:    Date Approved/Denied:   PASRR Number: [id number]  Discharge Plan: SNF(with palliative)    Current Diagnoses: Patient Active Problem List   Diagnosis Date Noted  . Atrial fibrillation (HCC) 06/14/2018  . CKD (chronic kidney disease) stage 4, GFR 15-29 ml/min (HCC) 06/14/2018  . CHF (congestive heart failure) (HCC) 06/14/2018  . HTN (hypertension) 06/14/2018  . C4 cervical fracture (HCC) 06/14/2018  . Hypothyroidism 06/14/2018  . Malnutrition of moderate degree 06/14/2018  . Syncope 06/13/2018    Orientation RESPIRATION BLADDER Height & Weight     Self  Normal Incontinent Weight: 112 lb 7 oz (51 kg) Height:  5\' 6"  (167.6 cm)  BEHAVIORAL SYMPTOMS/MOOD NEUROLOGICAL BOWEL NUTRITION STATUS  (None) (None) Continent Diet(Heart healthy)  AMBULATORY STATUS COMMUNICATION OF NEEDS Skin   Extensive Assist Verbally Skin abrasions, Bruising, Other (Comment)(MASD.)                       Personal Care Assistance Level of Assistance  Bathing, Feeding, Dressing Bathing Assistance: Limited assistance Feeding assistance: Limited assistance Dressing Assistance: Limited assistance     Functional Limitations Info  Sight, Hearing, Speech Sight Info: Adequate Hearing Info: Adequate Speech Info: Adequate    SPECIAL CARE FACTORS FREQUENCY  PT (By licensed  PT), Blood pressure, OT (By licensed OT)     PT Frequency: 5 x week OT Frequency: 5 x week            Contractures Contractures Info: Not present    Additional Factors Info  Code Status, Allergies Code Status Info: DNR Allergies Info: Ace Inhibitors, Colchicine, Crestor (Rosuvastatin Calcium), Ezetimibe, Hydrocodone-acetaminophen, Lipitor (Atorvastatin Calcium), Morphine And Related, Nsaids, Pravachol, Rosuvastatin, Shellfish Allergy, Tolmetin, Tramadol, Zocor (Simvastatin) Psychotropic Info: DULoxetine (CYMBALTA) DR capsule 30 mg daily PO         Current Medications (06/17/2018):  This is the current hospital active medication list Current Facility-Administered Medications  Medication Dose Route Frequency Provider Last Rate Last Dose  . acetaminophen (TYLENOL) tablet 650 mg  650 mg Oral Q6H Zigmund Daniel., MD   650 mg at 06/17/18 [phone]  . diltiazem (CARDIZEM CD) 24 hr capsule 180 mg  180 mg Oral Daily John Giovanni, MD   180 mg at 06/17/18 0940  . DULoxetine (CYMBALTA) DR capsule 30 mg  30 mg Oral Daily John Giovanni, MD   30 mg at 06/17/18 0940  . feeding supplement (ENSURE ENLIVE) (ENSURE ENLIVE) liquid 237 mL  237 mL Oral BID BM John Giovanni, MD   237 mL at 06/17/18 0940  . levothyroxine (SYNTHROID, LEVOTHROID) tablet 88 mcg  88 mcg Oral QAC breakfast John Giovanni, MD   88 mcg at 06/17/18 0651  . magnesium hydroxide (MILK OF MAGNESIA) suspension 30 mL  30 mL Oral Daily PRN Zigmund Daniel., MD   30 mL at 06/17/18 0940  . Melatonin TABS 3 mg  3 mg Oral QHS PRN John Giovanni, MD   3 mg at 06/17/18 0042  . traMADol (ULTRAM) tablet 50 mg  50 mg Oral Q6H PRN John Giovanni, MD   50 mg at 06/14/18 1155     Discharge Medications: Please see discharge summary for a list of discharge medications.  Relevant Imaging Results:  Relevant Lab Results:   Additional Information SS#: [id number]. Lives at [facility].  Margarito Liner, LCSW

## 2018-06-18 ENCOUNTER — Observation Stay (HOSPITAL_COMMUNITY): Payer: Medicare Other

## 2018-06-18 DIAGNOSIS — R55 Syncope and collapse: Secondary | ICD-10-CM | POA: Diagnosis not present

## 2018-06-18 DIAGNOSIS — N184 Chronic kidney disease, stage 4 (severe): Secondary | ICD-10-CM | POA: Diagnosis not present

## 2018-06-18 DIAGNOSIS — I509 Heart failure, unspecified: Secondary | ICD-10-CM | POA: Diagnosis not present

## 2018-06-18 DIAGNOSIS — S0003XA Contusion of scalp, initial encounter: Secondary | ICD-10-CM | POA: Diagnosis not present

## 2018-06-18 LAB — BASIC METABOLIC PANEL
Anion gap: 6 (ref 5–15)
BUN: 34 mg/dL — ABNORMAL HIGH (ref 8–23)
CHLORIDE: 111 mmol/L (ref 98–111)
CO2: 27 mmol/L (ref 22–32)
Calcium: 8.9 mg/dL (ref 8.9–10.3)
Creatinine, Ser: 1.14 mg/dL — ABNORMAL HIGH (ref 0.44–1.00)
GFR calc non Af Amer: 40 mL/min — ABNORMAL LOW (ref >60)
GFR, EST AFRICAN AMERICAN: 47 mL/min — AB (ref >60)
Glucose, Bld: 96 mg/dL (ref 70–99)
Potassium: 4.5 mmol/L (ref 3.5–5.1)
Sodium: 144 mmol/L (ref 135–145)

## 2018-06-18 LAB — CBC
HEMATOCRIT: 35.3 % — AB (ref 36.0–46.0)
HEMOGLOBIN: 10.6 g/dL — AB (ref 12.0–15.0)
MCH: 30.4 pg (ref 26.0–34.0)
MCHC: 30 g/dL (ref 30.0–36.0)
MCV: 101.1 fL — ABNORMAL HIGH (ref 80.0–100.0)
NRBC: 0 % (ref 0.0–0.2)
Platelets: 175 10*3/uL (ref 150–400)
RBC: 3.49 MIL/uL — ABNORMAL LOW (ref 3.87–5.11)
RDW: 13 % (ref 11.5–15.5)
WBC: 7.4 10*3/uL (ref 4.0–10.5)

## 2018-06-18 LAB — MAGNESIUM: Magnesium: 2.6 mg/dL — ABNORMAL HIGH (ref 1.7–2.4)

## 2018-06-18 NOTE — Progress Notes (Signed)
Daily Progress Note   Patient Name: Jennifer Bender       Date: 06/18/2018 DOB: [birth year]  Age: 84 y.o. MRN#: [id number] Attending Physician: Zigmund Daniel., * Primary Care Physician: Default, Provider, MD Admit Date: 06/13/2018  Reason for Consultation/Follow-up: Establishing goals of care  Subjective: Patient lying in bed with Aspen collar on. She was easily aroused but somewhat lethargic. She would awaken to name however, closed eyes immediately. She is alert to self. Breakfast tray present and patient has not eaten much. Appears comfortable on assessment. No signs of pain or discomfort. No family at the bedside. Patient remained asleep during my assessment with an occasional moan.    Patient appears more lethargic today. Would not follow commands when asked. The few times she woke up she seemed agitated. Incontinent of urine.   Per attending initial plans are for patient to discharge to SNF for rehabilitation based on PT recommendations. Family is in agreement but also somewhat concerned if patient will tolerate. She has not required mittens in since Saturday. When reviewing patient is eating 25% or less on average of meals. Will continue to closely assess patient as she may be appropriate for hospice care if she continues to show signs of decline and deconditioning such as poor nutrition, lethargy, decrease in interest including medications. Family is supportive of hospice care (residential) or SNF w/rehab depending on patient's progress.   Chart reviewed and report received from bedside RN.   Length of Stay: 0  Current Medications: Scheduled Meds:  . acetaminophen  650 mg Oral Q6H  . diltiazem  180 mg Oral Daily  . DULoxetine  30 mg Oral Daily  . feeding supplement (ENSURE  ENLIVE)  237 mL Oral BID BM  . levothyroxine  88 mcg Oral QAC breakfast  . polyethylene glycol  17 g Oral BID  . traZODone  25 mg Oral QHS    Continuous Infusions:   PRN Meds: magnesium hydroxide, Melatonin, traMADol  Physical Exam  Constitutional: Vital signs are normal. She is uncooperative. She is easily aroused. She appears ill.  Thin, chronically ill appearing   Cardiovascular: Normal rate, regular rhythm, normal heart sounds and normal pulses.  Pulmonary/Chest: Effort normal. She has decreased breath sounds.  Abdominal: Soft. Normal appearance and bowel sounds are normal.  Neurological: She is alert and easily  aroused.  Alert to self only.   Skin: Skin is warm, dry and intact. Bruising noted.  Psychiatric: Cognition and memory are impaired. She expresses inappropriate judgment.  Nursing note and vitals reviewed.           Vital Signs: BP (!) 155/67 (BP Location: Right Arm)   Pulse 60   Temp (!) 97.5 F (36.4 C) (Oral)   Resp 18   Ht 5\' 6"  (1.676 m)   Wt 50.8 kg   SpO2 98%   BMI 18.08 kg/m  SpO2: SpO2: 98 % O2 Device: O2 Device: Room Air O2 Flow Rate:    Intake/output summary:   Intake/Output Summary (Last 24 hours) at 06/18/2018 1538 Last data filed at 06/18/2018 1021 Gross per 24 hour  Intake 210 ml  Output -  Net 210 ml   LBM: Last BM Date: 06/17/18 Baseline Weight: Weight: 49 kg Most recent weight: Weight: 50.8 kg  Palliative Assessment/Data: PPS 20%   Flowsheet Rows     Most Recent Value  Intake Tab  Referral Department  Hospitalist  Unit at Time of Referral  Cardiac/Telemetry Unit  Palliative Care Primary Diagnosis  Cardiac  Date Notified  06/15/18  Palliative Care Type  New Palliative care  Reason for referral  Clarify Goals of Care  Date of Admission  06/13/18  Date first seen by Palliative Care  06/16/18  # of days Palliative referral response time  1 Day(s)  # of days IP prior to Palliative referral  2  Clinical Assessment    Psychosocial & Spiritual Assessment  Palliative Care Outcomes      Patient Active Problem List   Diagnosis Date Noted  . Atrial fibrillation (HCC) 06/14/2018  . CKD (chronic kidney disease) stage 4, GFR 15-29 ml/min (HCC) 06/14/2018  . CHF (congestive heart failure) (HCC) 06/14/2018  . HTN (hypertension) 06/14/2018  . C4 cervical fracture (HCC) 06/14/2018  . Hypothyroidism 06/14/2018  . Malnutrition of moderate degree 06/14/2018  . Syncope 06/13/2018    Palliative Care Assessment & Plan   Patient Profile: 84 y.o. female admitted on 06/13/2018 from [facility] after reportedly becoming unresponsive in her wheelchair. Staff were about to initiate CPR however patient spontaneously became responsive. She has a past medical history of congestive heart failure, atrial fibrillation, chronic kidney disease stage IV, pacemaker,  Alzheimer's disease, and hypertension.  Patient was recently seen in the ER on yesterday after having a fall where she struck her head.  Images at that time revealed a nondisplaced C4 lamina and spinous process fracture.  Patient was discharged back to facility with an aspirin,.  She was seen by neurosurgery who deemed patient was not a good candidate for surgery given her advanced age and comorbidities.  During her ED course granddaughter was at the bedside and provided history.  Patient has a history of dementia and unable to provide medical information.  Family reported patient mental status is at baseline.  Patient denies chest pain, shortness of breath, nausea, vomiting, and dizziness to admitting provider.  Vital signs are stable.  CBG 116.  Creatinine 1.5, troponin 0 0.03, UA negative for UTI, all other labs unremarkable.  Head CT showed a new finding of a small amount of layering blood in the posterior horn of the left lateral ventricle.  Since admission patient continues on home medication regimen.  Family verbalizes patient is sensitive to pain medications which  causes hallucinations and would not want patient to receive any forms of narcotics if possible.  Patient continues  to wear Aspen collar as ordered.  Palliative medicine team consulted for goals of care discussion.  Recommendations/Plan:  DNR/DNI  Continue to treat the treatable without escalation of care.   Plans are for patient to discharge to SNF w/rehab and outpatient palliative. Family concerned if patient is continuing to decline and will willingly participate in rehab or if she is to tired to participate and should be allowed to kept comfortable. They are willing to continue to continue with SNF placement unless she shows signs of decline given she is not able to return home w/family due to level of care needs.   PMT will continue to support.   Goals of Care and Additional Recommendations:  Limitations on Scope of Treatment: Full Scope Treatment-continue to treat the treatable without escalation.   Code Status:    Code Status Orders  (From admission, onward)         Start     Ordered   06/14/18 0616  Do not attempt resuscitation (DNR)  Continuous    Question Answer Comment  In the event of cardiac or respiratory ARREST Do not call a "code blue"   In the event of cardiac or respiratory ARREST Do not perform Intubation, CPR, defibrillation or ACLS   In the event of cardiac or respiratory ARREST Use medication by any route, position, wound care, and other measures to relive pain and suffering. May use oxygen, suction and manual treatment of airway obstruction as needed for comfort.      06/14/18 0618        Code Status History    This patient has a current code status but no historical code status.    Advance Directive Documentation     Most Recent Value  Type of Advance Directive  Healthcare Power of Attorney, Living will  Pre-existing out of facility DNR order (yellow form or pink MOST form)  -  "MOST" Form in Place?  -      Prognosis:  Poor   Discharge  Planning:  Skilled Nursing Facility for rehab with Palliative care service follow-up  Care plan was discussed with patient's family and bedside RN. Discussed with Dr. Lowell Guitar previously.   Thank you for allowing the Palliative Medicine Team to assist in the care of this patient.   Total Time 45 min.  Prolonged Time Billed  NO       Greater than 50%  of this time was spent counseling and coordinating care related to the above assessment and plan.  Willette Alma, AGPCNP-BC Palliative Medicine Team  Pager: [phone] Amion: Thea Alken   Please contact Palliative Medicine Team phone at [phone] for questions and concerns.

## 2018-06-18 NOTE — Progress Notes (Signed)
PROGRESS NOTE    Jennifer Bender  ZOX:[id number] DOB: 10/05/1923 DOA: 06/13/2018 PCP: Default, Provider, MD   Brief Narrative:  Jennifer Bender is a 84 y.o. female with medical history significant of atrial fibrillation, PPM, CKD IV, congestive heart failure, hypertension resenting to the ED after syncopal event.  Per chart, patient was in her wheelchair at [facility] place and went unresponsive in her wheelchair. Staff lowered her to the ground and were about to start CPR when she started responding.  Patient was here on 10/23 after sustaining a fall where she struck her head and had a nondisplaced C4 lamina and spinous process fracture.  She was discharged back to the facility with an Aspen collar.  Patient was seen by neurosurgery and they felt that due to the patient's age and comorbidities she would not be a good candidate for surgery.   She had a head CT showing an intraventricular hemorrhage, likely related to her recent head trauma.  No interventions recommended by neurosurgery.  She was seen by neurology due to an irregular L pupil noted on 10/27 which was suspected to be post surgical.  Her syncope workup was unrevealing.  She was seen by palliative care and currently the plan is to discharge to SNF with palliative following.  She's had delirium at night and required mittens overnight.  She was noted to be lethargic on 10/29 AM.  Head CT was stable.  Suspect this may be related to trazodone she received on 10/28 vs delirium related to her hospitalization.  Discharge currently pending improvement in delirium and SNF placement.  Assessment & Plan:   Principal Problem:   Syncope Active Problems:   Atrial fibrillation (HCC)   CKD (chronic kidney disease) stage 4, GFR 15-29 ml/min (HCC)   CHF (congestive heart failure) (HCC)   HTN (hypertension)   C4 cervical fracture (HCC)   Hypothyroidism   Malnutrition of moderate degree  Acute Encephalopathy  Delirium:fluctuating over the past day or so,  but she was lethargic this morning (10/29).  Of note, she received 25 mg trazodone last night and per family is very sensitive to medications.  Given below, head CT repeated and stable.  Discussed with nursing who noted she was improved this afternoon, but still confused Will discontinue trazodone Delirium precautions  Intraventricular Hemorrhage: suspect related to recent head trauma. No focal deficits on exam. Dr. Wynetta Emery recommends BP control and if neuro sx, c/s neurology. Neuro checks irregular L pupil noted on 10/27.  Repeat head CT unchanged.  Neurology suspects post surgical and chronic.  Recommended repeat head CT in about 1 week, hold antiplatelets/anticoagulants.  Frequent Falls  Nondisplaced C4 lamina fracture -Seen on CT done June 12, 2018. - Seen by Dr. Maurice Small on 10/23 and noted to be a poor surgical candidate - He recommended rigid cervical collar, but this is unlikely to protect her completely  - Discussed with Dr. Maurice Small today as with her delirium, she's been taking her collar off, especially at night.  He noted that we could try taping the c collar.   - Given her dementia and delirium, she may remove her collar at times and she is at risk for spinal cord injury, but would replace and reorient when it comes off and try taping to limit this, but given her dementia and delirium, she's unlikely to have 100% compliance with this.  I discussed this with her son and granddaughter.  -Aspen collar until follow up with Dr. Maurice Small in 6 weeks -Avoid narcotics.Patient is sensitive and  has hallucinated in the past when given narcotics. Schedule APAP. -Frequent falls recently, PT recommend SNF/24 hour supervision/assistance  Syncope  Etiology unclear.    Vitals stable on presentation.  CBG 116. No leukocytosis. Hemoglobin normal.  EKG with atrial fibrillation, similar to priors UA negative Troponins low and flat CXR with cardiomegaly Head CT showed  Pacemaker  interrogated and only notable for 7 beats of RVR on oct 3 Echo with normal EF, reduced RV function, elevated PASP (see report) - consider cards f/u outpatient as c/w goals of care Orthostaticsnot done (she was unable to stand for this) Etiology of syncope unclear, but pt appears back to baseline at this time.   Abdominal Distension: Plain film noted large stool burden concerning for impaction. Disimpaction ordered and attempted, no stool noted to disimpact BID miralax (she had BM today)  Scalp Laceration: staples placed 10/23.  Would evaluate for removal 7-10 days post placement.  Atrial fibrillation -CHA2DS2VASc 5.  -Currently rate controlled. Patient has a pacemaker. -Would not anticoagulate due to IVH above and risk of falls  CKD 4 -Stable.Follow outpatient.  CHF -Echo as below -holding torsemide, follow volume status and electrolytes closely  Hypertension -Continue home Cardizem  Hypothyroidism -Continue home Synthroid  Wheezing: cxr without edema or consolidation  Anemia: Hb decreased from 14.3 at presentation to ~10.  I think related to aggressive IVF at admission.  Follow.   Goals of care:Seen by palliative care.  Recommended DNR/DNI, plan for SNF with rehab and outpatient palliative care.  They've continued to follow.  Son Jennifer Bender is POA [phone]) Daughter Jennifer Bender [phone]) Granddaughter Jennifer Bender   DVT prophylaxis: SCD Code Status: DNR Family Communication: granddaughter and son Disposition Plan: likely SNF tomorrow   Consultants:   neurosurgery  Procedures:  Echo Study Conclusions  - Left ventricle: The cavity size was normal. Wall thickness was   increased in a pattern of mild LVH. Systolic function was normal.   The estimated ejection fraction was in the range of 60% to 65%. - Mitral valve: Mildly to moderately calcified annulus. Mildly   thickened leaflets . There was mild regurgitation. - Left atrium: The  atrium was moderately dilated. - Right ventricle: The cavity size was mildly dilated. Systolic   function was mildly reduced. - Right atrium: The atrium was severely dilated. - Tricuspid valve: There was moderate regurgitation. - Pulmonary arteries: PA peak pressure: 65 mm Hg (S).  Antimicrobials:  Anti-infectives (From admission, onward)   None     Subjective: Responds to voice, but doesn't say much otherwise. Lethargic.  Objective: Vitals:   06/17/18 0930 06/17/18 1206 06/17/18 1944 06/18/18 0421  BP: 120/69 (!) 149/75 136/65 (!) 155/67  Pulse: 69 73 64 60  Resp:  20 18 18   Temp:  97.9 F (36.6 C) 98 F (36.7 C) (!) 97.5 F (36.4 C)  TempSrc:  Oral  Oral  SpO2: 99% 99%  98%  Weight:    50.8 kg  Height:        Intake/Output Summary (Last 24 hours) at 06/18/2018 1838 Last data filed at 06/18/2018 1021 Gross per 24 hour  Intake 210 ml  Output -  Net 210 ml   Filed Weights   06/16/18 0428 06/17/18 0455 06/18/18 0421  Weight: 52 kg 51 kg 50.8 kg    Examination:  General: No acute distress. Cardiovascular: Heart sounds show a regular rate, and rhythm.  Lungs: Clear to auscultation bilaterally Abdomen: Soft, nontender, nondistended  Neurological:  Lethargic, disoriented.  Not following  commands. Moves all extremities 4. Cranial nerves II through XII grossly intact other than irregular pupil to L eye. Skin: hematoma to L forehead, staples to L side of head Extremities: No clubbing or cyanosis. No edema.  Psychiatric: Mood and affect are normal. Insight and judgment are impaired.  Data Reviewed: I have personally reviewed following labs and imaging studies  CBC: Recent Labs  Lab 06/13/18 1829 06/15/18 0635 06/16/18 0627 06/17/18 0533 06/18/18 0451  WBC 7.0 6.9 9.2 8.6 7.4  NEUTROABS 5.3  --   --   --   --   HGB 14.3 10.7* 10.3* 10.5* 10.6*  HCT 47.2* 34.8* 32.9* 34.0* 35.3*  MCV 100.4* 101.2* 100.0 100.3* 101.1*  PLT 177 197 184 176 175   Basic  Metabolic Panel: Recent Labs  Lab 06/13/18 1829 06/15/18 0635 06/16/18 0627 06/17/18 0533 06/18/18 0451  NA 144 144 142 142 144  K 3.9 4.5 4.5 4.5 4.5  CL 107 112* 111 111 111  CO2 27 27 25 27 27   GLUCOSE 121* 101* 92 99 96  BUN 53* 37* 42* 41* 34*  CREATININE 1.55* 1.20* 1.31* 1.22* 1.14*  CALCIUM 8.6* 8.9 8.8* 8.9 8.9  MG  --  2.2 2.4 2.4 2.6*   GFR: Estimated Creatinine Clearance: 25.3 mL/min (A) (by C-G formula based on SCr of 1.14 mg/dL (H)). Liver Function Tests: Recent Labs  Lab 06/17/18 0533  AST 20  ALT 15  ALKPHOS 130*  BILITOT 0.5  PROT 5.7*  ALBUMIN 2.9*   Recent Labs  Lab 06/17/18 0533  LIPASE 38   No results for input(s): AMMONIA in the last 168 hours. Coagulation Profile: No results for input(s): INR, PROTIME in the last 168 hours. Cardiac Enzymes: Recent Labs  Lab 06/13/18 1829 06/14/18 0623 06/14/18 1154 06/14/18 1743  TROPONINI 0.03* 0.03* 0.03* <0.03   BNP (last 3 results) No results for input(s): PROBNP in the last 8760 hours. HbA1C: No results for input(s): HGBA1C in the last 72 hours. CBG: Recent Labs  Lab 06/13/18 1827  GLUCAP 116*   Lipid Profile: No results for input(s): CHOL, HDL, LDLCALC, TRIG, CHOLHDL, LDLDIRECT in the last 72 hours. Thyroid Function Tests: No results for input(s): TSH, T4TOTAL, FREET4, T3FREE, THYROIDAB in the last 72 hours. Anemia Panel: No results for input(s): VITAMINB12, FOLATE, FERRITIN, TIBC, IRON, RETICCTPCT in the last 72 hours. Sepsis Labs: No results for input(s): PROCALCITON, LATICACIDVEN in the last 168 hours.  Recent Results (from the past 240 hour(s))  MRSA PCR Screening     Status: None   Collection Time: 06/14/18  2:01 AM  Result Value Ref Range Status   MRSA by PCR NEGATIVE NEGATIVE Final    Comment:        The GeneXpert MRSA Assay (FDA approved for NASAL specimens only), is one component of a comprehensive MRSA colonization surveillance program. It is not intended to diagnose  MRSA infection nor to guide or monitor treatment for MRSA infections. Performed at [facility], [address] [place], [place] [address]          Radiology Studies: Dg Abd 1 View  Result Date: 06/17/2018 CLINICAL DATA:  Abdominal distension. EXAM: ABDOMEN - 1 VIEW COMPARISON:  Radiographs of May 30, 2018. FINDINGS: Large amount of stool is seen in the sigmoid colon and rectum concerning for impaction. No evidence of other bowel dilatation is noted. Atherosclerosis of abdominal aorta is noted. No abnormal calcifications are noted. IMPRESSION: Large stool burden is noted in the sigmoid colon  and rectum concerning for impaction. No other bowel dilatation is noted. Aortic Atherosclerosis (ICD10-I70.0). Electronically Signed   By: Lupita Raider, M.D.   On: 06/17/2018 18:47   Ct Head Wo Contrast  Result Date: 06/18/2018 CLINICAL DATA:  Altered mental status. EXAM: CT HEAD WITHOUT CONTRAST TECHNIQUE: Contiguous axial images were obtained from the base of the skull through the vertex without intravenous contrast. COMPARISON:  CT scan of June 16, 2018. FINDINGS: Brain: Stable small amount of hemorrhage seen in left posterior horn. Mild diffuse cortical atrophy. Mild chronic ischemic white matter disease. No mass effect or midline shift is noted. Ventricular size is within normal limits. There is no evidence of mass lesion, hemorrhage or acute infarction. Vascular: No hyperdense vessel or unexpected calcification. Skull: Normal. Negative for fracture or focal lesion. Sinuses/Orbits: Right frontal, ethmoid and maxillary sinusitis is again noted. Other: Stable left frontal scalp hematoma is noted. IMPRESSION: Stable small amount of hemorrhage seen in left posterior horn. No new hemorrhage is noted. Mild diffuse cortical atrophy. Mild chronic ischemic white matter disease. Stable chronic sinusitis. Electronically Signed   By: Lupita Raider, M.D.   On: 06/18/2018 13:21         Scheduled Meds: . acetaminophen  650 mg Oral Q6H  . diltiazem  180 mg Oral Daily  . DULoxetine  30 mg Oral Daily  . feeding supplement (ENSURE ENLIVE)  237 mL Oral BID BM  . levothyroxine  88 mcg Oral QAC breakfast  . polyethylene glycol  17 g Oral BID   Continuous Infusions:   LOS: 0 days    Time spent: over 30 min    Lacretia Nicks, MD [facility] Pager [phone]  If 7PM-7AM, please contact night-coverage [email or web address] Password [facility] 06/18/2018, 6:38 PM

## 2018-06-18 NOTE — Clinical Social Work Note (Addendum)
Per SNF admissions coordinator, son did not go to facility yesterday to complete paperwork. She also stated daughter called and told them she would need restraints at night and needed to be sedated to sleep. If this is the case they cannot accept her. CSW explained that patient was not in mittens last night and based on MAR, patient is only getting scheduled Trazodone and prn Melatonin at night. Admissions coordinator reviewing RN notes with their RN to confirm she is stable. Per RN in morning progression, patient does not have any behavior issues other than trying to take her collar off. She stays in bed and taker her medications cooperatively.  Charlynn Court, CSW [phone]  1:23 pm [facility] has rescinded their bed offer, stating they did not believe they would be able to care for her, especially since she keeps taking her collar off. MD aware. CSW has notified patient's son. He stated [facility] wanted him to hire someone to stay with her for 6 weeks if she returned. He is agreeable to looking into other local SNF's. CSW will follow up with him once bed offers available.  Charlynn Court, CSW [phone]  3:58 pm Left voicemail for patient's son. Will give bed offers when he calls back.  Charlynn Court, CSW [phone]  4:29 pm Received call back from patient's son. Emailed list of bed offers for him to review. He will follow up with decision.  Charlynn Court, CSW [phone]

## 2018-06-18 NOTE — Progress Notes (Signed)
disempaction attempted. No stool near rectum noted to disempact.

## 2018-06-18 NOTE — Progress Notes (Signed)
Pt. With 9 beats of Vtach. Alert and stable. VSS. On call for [facility] paged to make aware.

## 2018-06-18 NOTE — Progress Notes (Signed)
Physical Therapy Treatment Patient Details Name: Jennifer Bender MRN: [id number] DOB: 1924/07/28 Today's Date: 06/18/2018    History of Present Illness Pt is a 84 y.o. female admitted 06/13/18 after becoming unresponsive while seated in wheelchair at SNF; worked up for syncope, etiology unclear; acute encephalopathy/delirium. Pt with recent fall 06/12/18 sustaining nondisplaced C4 lamina and spinous process fx and small amount of hemorrhage in L posterior horn; d/c back to SNF with aspen collar. PMH includes a-fib, CKD IV, CHF, HTN.    PT Comments    Pt received seated in recliner, very lethargic. Cervical collar readjusted for better fit; pt reports no pain with this. Pt pleasantly confused- only oriented to self despite attempts to reorient, mumbling speech, and not consistently following commands. Required maxA for repositioning in chair, falling asleep during this. Further mobility limited secondary to lethargy. Continue to recommend SNF-level therapies.    Follow Up Recommendations  SNF;Supervision/Assistance - 24 hour     Equipment Recommendations  None recommended by PT    Recommendations for Other Services       Precautions / Restrictions Precautions Precautions: Fall;Cervical Required Braces or Orthoses: Cervical Brace Cervical Brace: Hard collar;At all times Restrictions Weight Bearing Restrictions: No    Mobility  Bed Mobility               General bed mobility comments: Received sitting in recliner  Transfers                 General transfer comment: Not completed secondary to pt lethargy and not consistently following commands; requiring maxA to reposition in chair and scoot back.   Ambulation/Gait                 Stairs             Wheelchair Mobility    Modified Rankin (Stroke Patients Only)       Balance Overall balance assessment: Needs assistance Sitting-balance support: Feet supported;No upper extremity supported Sitting  balance-Leahy Scale: Fair Sitting balance - Comments: Reliant on UE support to maintain balance                                    Cognition Arousal/Alertness: Lethargic Behavior During Therapy: Flat affect Overall Cognitive Status: History of cognitive impairments - at baseline Area of Impairment: Orientation;Attention;Memory;Following commands;Safety/judgement;Awareness;Problem solving                 Orientation Level: Disoriented to;Place;Time;Situation Current Attention Level: Focused Memory: Decreased short-term memory Following Commands: Follows one step commands inconsistently;Follows one step commands with increased time Safety/Judgement: Decreased awareness of safety;Decreased awareness of deficits Awareness: Intellectual Problem Solving: Requires verbal cues General Comments: Pt lethargic, requiring max cues to open eyes. Mumbling responses, only clearly able to understand pt's name. Pt repeating self, clearly disoriented to situation      Exercises      General Comments General comments (skin integrity, edema, etc.): Aspen collar noted to be loose; readjusted and pt reports no pain      Pertinent Vitals/Pain Pain Assessment: Faces Faces Pain Scale: No hurt    Home Living                      Prior Function            PT Goals (current goals can now be found in the care plan section) Acute Rehab PT Goals Patient Stated Goal:  None stated PT Goal Formulation: With patient Time For Goal Achievement: 06/29/18 Potential to Achieve Goals: Fair Progress towards PT goals: Not progressing toward goals - comment(Limited by lethargy)    Frequency    Min 2X/week      PT Plan Current plan remains appropriate    Co-evaluation              AM-PAC PT "6 Clicks" Daily Activity  Outcome Measure  Difficulty turning over in bed (including adjusting bedclothes, sheets and blankets)?: Unable Difficulty moving from lying on back to  sitting on the side of the bed? : Unable Difficulty sitting down on and standing up from a chair with arms (e.g., wheelchair, bedside commode, etc,.)?: Unable Help needed moving to and from a bed to chair (including a wheelchair)?: A Lot Help needed walking in hospital room?: Total Help needed climbing 3-5 steps with a railing? : Total 6 Click Score: 7    End of Session Equipment Utilized During Treatment: Cervical collar Activity Tolerance: Patient limited by fatigue;Patient limited by lethargy Patient left: in chair;with call bell/phone within reach;with chair alarm set Nurse Communication: Mobility status PT Visit Diagnosis: Unsteadiness on feet (R26.81);Other abnormalities of gait and mobility (R26.89)     Time: 4098-1191 PT Time Calculation (min) (ACUTE ONLY): 12 min  Charges:  $Therapeutic Activity: 8-22 mins                    Ina Homes, PT, DPT Acute Rehabilitation Services  Pager [phone] Office [phone]  Malachy Chamber 06/18/2018, 2:59 PM

## 2018-06-18 NOTE — Plan of Care (Signed)
  Problem: Safety: Goal: Ability to remain free from injury will improve Outcome: Progressing  Pt. Does not attempt to get out of bed alone, does remove neck collar

## 2018-06-19 DIAGNOSIS — I4821 Permanent atrial fibrillation: Secondary | ICD-10-CM | POA: Diagnosis not present

## 2018-06-19 DIAGNOSIS — N184 Chronic kidney disease, stage 4 (severe): Secondary | ICD-10-CM | POA: Diagnosis not present

## 2018-06-19 DIAGNOSIS — E039 Hypothyroidism, unspecified: Secondary | ICD-10-CM | POA: Diagnosis not present

## 2018-06-19 DIAGNOSIS — R55 Syncope and collapse: Secondary | ICD-10-CM | POA: Diagnosis not present

## 2018-06-19 LAB — BASIC METABOLIC PANEL
Anion gap: 4 — ABNORMAL LOW (ref 5–15)
BUN: 43 mg/dL — ABNORMAL HIGH (ref 8–23)
CALCIUM: 8.7 mg/dL — AB (ref 8.9–10.3)
CHLORIDE: 111 mmol/L (ref 98–111)
CO2: 30 mmol/L (ref 22–32)
Creatinine, Ser: 1.5 mg/dL — ABNORMAL HIGH (ref 0.44–1.00)
GFR calc Af Amer: 34 mL/min — ABNORMAL LOW (ref >60)
GFR calc non Af Amer: 29 mL/min — ABNORMAL LOW (ref >60)
Glucose, Bld: 100 mg/dL — ABNORMAL HIGH (ref 70–99)
POTASSIUM: 5.2 mmol/L — AB (ref 3.5–5.1)
SODIUM: 145 mmol/L (ref 135–145)

## 2018-06-19 LAB — CBC
HEMATOCRIT: 34.1 % — AB (ref 36.0–46.0)
HEMOGLOBIN: 10.3 g/dL — AB (ref 12.0–15.0)
MCH: 30.8 pg (ref 26.0–34.0)
MCHC: 30.2 g/dL (ref 30.0–36.0)
MCV: 102.1 fL — ABNORMAL HIGH (ref 80.0–100.0)
Platelets: 160 10*3/uL (ref 150–400)
RBC: 3.34 MIL/uL — AB (ref 3.87–5.11)
RDW: 13 % (ref 11.5–15.5)
WBC: 6.8 10*3/uL (ref 4.0–10.5)
nRBC: 0 % (ref 0.0–0.2)

## 2018-06-19 LAB — MAGNESIUM: Magnesium: 2.7 mg/dL — ABNORMAL HIGH (ref 1.7–2.4)

## 2018-06-19 NOTE — Clinical Social Work Note (Addendum)
Patient's son has chosen [facility] and will go to facility at 11:00 to complete admissions paperwork. Authorization has been updated for start date of today. Patient can admit to facility once paperwork is complete.  Charlynn Court, CSW [phone]  11:36 am Patient's son has completed admissions paperwork at [facility]. Patient can discharge once summary is in.   Charlynn Court, CSW [phone]

## 2018-06-19 NOTE — Discharge Summary (Signed)
Discharge Summary  Jennifer Bender YNW:[id number] DOB: 1923/11/24  PCP: Default, Provider, MD  Admit date: 06/13/2018 Discharge date: 06/19/2018  Time spent: 25 minutes  Recommendations for Outpatient Follow-up:  1. Medication change: Trazodone discontinued  Discharge Diagnoses:  Active Hospital Problems   Diagnosis Date Noted  . Syncope 06/13/2018  . Atrial fibrillation (HCC) 06/14/2018  . CKD (chronic kidney disease) stage 4, GFR 15-29 ml/min (HCC) 06/14/2018  . CHF (congestive heart failure) (HCC) 06/14/2018  . HTN (hypertension) 06/14/2018  . C4 cervical fracture (HCC) 06/14/2018  . Hypothyroidism 06/14/2018  . Malnutrition of moderate degree 06/14/2018    Resolved Hospital Problems  No resolved problems to display.    Discharge Condition: Improved, being discharged to skilled nursing  Diet recommendation: Heart healthy  Vitals:   06/18/18 2027 06/19/18 0608  BP: (!) 91/39 137/79  Pulse: 81 63  Resp: 16 14  Temp: (!) 97.5 F (36.4 C) (!) 97.5 F (36.4 C)  SpO2: 91% 99%    History of present illness:  Jennifer Bender a 84 y.o.femalewith medical history significant ofatrial fibrillation,PPM,CKD IV,congestive heart failure, hypertension resenting to the ED after syncopal event. Per chart, patient was in her wheelchair at [facility] place and went unresponsive in her wheelchair.Staff lowered her to the ground and were about to start CPR when she started responding. Patient was here on 10/23 after sustaining a fall where she struck her head and had a nondisplaced C4 lamina and spinous process fracture. She was discharged back to the facility with an Aspen collar. Patient was seen by neurosurgery and they felt that due to the patient's age and comorbidities she would not be a good candidate for surgery.  She had a head CT showing an intraventricular hemorrhage, likely related to her recent head trauma.  No interventions recommended by neurosurgery.  She was seen  by neurology due to an irregular L pupil noted on 10/27 which was suspected to be post surgical.  Her syncope workup was unrevealing.  She was seen by palliative care and currently the plan is to discharge to SNF with palliative following.   Hospital Course:  Principal Problem:   Syncope Active Problems:   Atrial fibrillation (HCC)   CKD (chronic kidney disease) stage 4, GFR 15-29 ml/min (HCC)   CHF (congestive heart failure) (HCC)   HTN (hypertension)   C4 cervical fracture (HCC)   Hypothyroidism   Malnutrition of moderate degree  Acute Encephalopathy Delirium:fluctuating over the past day or so, but she was lethargic on morning of (10/29).  Of note, she received 25 mg trazodone last night and per family is very sensitive to medications. Given below, head CT repeated and stable.  Discussed with nursing who noted she was improved this afternoon, but still confused Trazodone discontinued and patient on morning of 10/30, awake, interactive. Delirium precautions  Intraventricular Hemorrhage: suspect related to recent head trauma. No focal deficits on exam. Dr. Wynetta Emery recommends BP control and if neuro sx, c/s neurology. Neuro checks irregular L pupilnoted on 10/27. Repeat head CT unchanged. Neurology suspects post surgical and chronic. Recommended repeat head CT in about 1 week, hold antiplatelets/anticoagulants.  Moderate protein calorie malnutrition: Patient meets criteria as related to chronic illness in regards to her dementia, CHF and CKD as evidenced by 14% weight loss in 5 months, moderate muscle and fat depletion.  Seen by nutrition.  Put on Ensure twice daily plus Magic cup twice daily with meals  Frequent Falls Nondisplaced C4 lamina fracture -Seen on CT done June 12, 2018. -  Seen by Dr. Maurice Small on 10/23 and noted to be a poor surgical candidate - He recommended rigid cervical collar, but this is unlikely to protect her completely  - Discussed with Dr. Maurice Small  of neurosurgery as with her delirium, she's been taking her collar off, especially at night.  He noted that we could try taping the c-collar.   - Given her dementia and delirium, she may remove her collar at times and she is at risk for spinal cord injury, but would replace and reorient when it comes off and try taping to limit this, but given her dementia and delirium, she's unlikely to have 100% compliance with this.  I discussed this with her son and granddaughter.  -Aspen collaruntil follow up with Dr. Maurice Small in 6 weeks -Avoid narcotics.Patient is sensitive and has hallucinated in the past when given narcotics. Schedule APAP. -Frequent falls recently, PT recommend SNF/24 hour supervision/assistance  Syncope  Etiology unclear.  Vitals stable on presentation. CBG 116. No leukocytosis. Hemoglobin normal. EKG with atrial fibrillation, similar to priors UA negative, Troponins low and flat Head CT negative for new hemorrhage, only what was previously seen Pacemaker interrogated and only notable for 7 beats of RVR on oct 3 Echo with normal EF, reduced RV function, elevated PASP (see report) - consider cards f/u outpatient as c/w goals of care Orthostaticsnot done (she was unable to stand for this) Etiology of syncope unclear, but pt appears back to baseline at this time.   Abdominal Distension: Plain film noted large stool burden concerning for impaction. Disimpaction ordered and attempted, no stool noted to disimpact BID miralax (she had BM on 10/29)  Scalp Laceration: staples placed 10/23.  Would evaluate for removal 7-10 days post placement.  Atrial fibrillation -CHA2DS2VASc 5.  -Currently rate controlled. Patient has a pacemaker. -Would not anticoagulate due to IVH above and risk of falls  CKD 4 -Stable.Follow outpatient.  CHF -Echo as below -holding torsemide, follow volume status and electrolytes closely  Hypertension -Continue home  Cardizem  Hypothyroidism -Continue home Synthroid  Wheezing: cxr without edema or consolidation  Anemia: Hb decreased from 14.3at presentation to ~10. I think related to aggressive IVF at admission. Has remained stable between 10 and 11  Goals of care:Seen by palliative care. Recommended DNR/DNI, plan for SNF with rehab and outpatient palliative care.  They've continued to follow.  Procedures:  Echocardiogram: Preserved ejection fraction, no evidence of diastolic dysfunction.  Mildly decreased right ventricular systolic function.  Mild mitral regurg  Consultations:  Neurosurgery  Discharge Exam: BP 137/79   Pulse 63   Temp (!) 97.5 F (36.4 C) (Oral)   Resp 14   Ht 5\' 6"  (1.676 m)   Wt 49.9 kg   SpO2 99%   BMI 17.76 kg/m   General: Alert, oriented x2, no acute distress Cardiovascular: Paced rhythm, rate controlled Respiratory: Clear to auscultation bilaterally  Discharge Instructions You were cared for by a hospitalist during your hospital stay. If you have any questions about your discharge medications or the care you received while you were in the hospital after you are discharged, you can call the unit and asked to speak with the hospitalist on call if the hospitalist that took care of you is not available. Once you are discharged, your primary care physician will handle any further medical issues. Please note that NO REFILLS for any discharge medications will be authorized once you are discharged, as it is imperative that you return to your primary care physician (or establish a  relationship with a primary care physician if you do not have one) for your aftercare needs so that they can reassess your need for medications and monitor your lab values.  Discharge Instructions    Call MD for:  difficulty breathing, headache or visual disturbances   Complete by:  As directed    Call MD for:  extreme fatigue   Complete by:  As directed    Call MD for:  persistant  dizziness or light-headedness   Complete by:  As directed    Call MD for:  persistant nausea and vomiting   Complete by:  As directed    Call MD for:  redness, tenderness, or signs of infection (pain, swelling, redness, odor or green/yellow discharge around incision site)   Complete by:  As directed    Call MD for:  severe uncontrolled pain   Complete by:  As directed    Call MD for:  temperature >100.4   Complete by:  As directed    Diet - low sodium heart healthy   Complete by:  As directed    Diet - low sodium heart healthy   Complete by:  As directed    Discharge instructions   Complete by:  As directed    You were seen for an episode of syncope (fainting).  The cause was not totally clear, but your pacemaker did not show any abnormal rhythms.  You had an intraventricular hemorrhage (brain bleed) which was likely due to your recent fall.  We spoke with neurosurgery who did not recommend any additional interventions.  We spoke to neurology given your abnormal pupil on your left eye and they recommended follow up CT scan in about 1 week.  You should not take any NSAIDs, aspirin, or blood thinners.    It's very important that you maintain the C collar in place until your follow up with Dr. Maurice Small in 6 weeks.  I stopped your torsemide.  Please follow up with your PCP regarding whether this needs to be restarted.  Palliative care spoke to you and the family and we're planning to discharge to the skilled nursing facility with palliative care following.  Return for new, recurrent, or worsening symptoms.  Please ask your PCP to request records from this hospitalization so they know what was done and what the next steps will be.   Increase activity slowly   Complete by:  As directed    Increase activity slowly   Complete by:  As directed    Other Restrictions   Complete by:  As directed    Keep cervical collar on as much as possible.     Allergies as of 06/19/2018      Reactions    Ace Inhibitors    Angioedema   Colchicine    unknown   Crestor [rosuvastatin Calcium]    Myalgias   Ezetimibe Other (See Comments)   Hydrocodone-acetaminophen Nausea Only   Lipitor [atorvastatin Calcium]    Myalgias    Morphine And Related Other (See Comments)   Unknown   Nsaids Nausea And Vomiting   Renal insufficiency  Renal sufficient   Pravachol Nausea Only   Rosuvastatin Other (See Comments)   Unknown   Shellfish Allergy Other (See Comments)   Tolmetin Other (See Comments)   Renal insufficiency    Tramadol Nausea Only   Zocor [simvastatin]    Myalgias       Medication List    STOP taking these medications   torsemide 20 MG tablet  Commonly known as:  DEMADEX     TAKE these medications   acetaminophen 325 MG tablet Commonly known as:  TYLENOL Take 650 mg by mouth 4 (four) times daily as needed for mild pain.   BLUE GEL 2 % Gel Generic drug:  Menthol (Topical Analgesic) Apply 1 application topically 3 (three) times daily.   DAILY VITE PO Take 1 tablet by mouth daily.   diltiazem 180 MG 24 hr capsule Commonly known as:  DILACOR XR Take 180 mg by mouth daily.   DULoxetine 30 MG capsule Commonly known as:  CYMBALTA Take 30 mg by mouth daily.   eucerin cream Apply 1 application topically at bedtime.   feeding supplement (ENSURE ENLIVE) Liqd Take 237 mLs by mouth 2 (two) times daily between meals.   fish oil-omega-3 fatty acids 1000 MG capsule Take 1 g by mouth daily.   levothyroxine 88 MCG tablet Commonly known as:  SYNTHROID, LEVOTHROID Take 88 mcg by mouth daily before breakfast.   Melatonin 5 MG Tabs Take 5 mg by mouth at bedtime as needed (sleep).   polyethylene glycol packet Commonly known as:  MIRALAX / GLYCOLAX Take 17 g by mouth daily.   potassium chloride 10 MEQ tablet Commonly known as:  K-DUR,KLOR-CON Take 10 mEq by mouth daily.      Allergies  Allergen Reactions  . Ace Inhibitors     Angioedema   . Colchicine      unknown  . Crestor [Rosuvastatin Calcium]     Myalgias   . Ezetimibe Other (See Comments)  . Hydrocodone-Acetaminophen Nausea Only  . Lipitor [Atorvastatin Calcium]     Myalgias   . Morphine And Related Other (See Comments)    Unknown  . Nsaids Nausea And Vomiting    Renal insufficiency  Renal sufficient  . Pravachol Nausea Only  . Rosuvastatin Other (See Comments)    Unknown  . Shellfish Allergy Other (See Comments)  . Tolmetin Other (See Comments)    Renal insufficiency   . Tramadol Nausea Only  . Zocor [Simvastatin]     Myalgias    Contact information for after-discharge care    Destination    [facility] .   Service:  Skilled Nursing Contact information: [address] [place] [address] [phone]               The results of significant diagnostics from this hospitalization (including imaging, microbiology, ancillary and laboratory) are listed below for reference.    Significant Diagnostic Studies: Dg Chest 2 View  Result Date: 06/13/2018 CLINICAL DATA:  Syncope EXAM: CHEST - 2 VIEW COMPARISON:  05/30/2018 FINDINGS: Cardiomegaly. Left pacer remains in place, unchanged. No confluent airspace opacities or effusions. No acute bony abnormality. Severe compression fracture noted in the lower thoracic spine, stable. Old right femoral neck fracture. IMPRESSION: Cardiomegaly.  No active cardiopulmonary disease. Electronically Signed   By: Charlett Nose M.D.   On: 06/13/2018 19:33   Dg Chest 2 View  Result Date: 05/30/2018 CLINICAL DATA:  Fall EXAM: CHEST - 2 VIEW COMPARISON:  03/27/2018 FINDINGS: Left pacer in place with leads in the right atrium and right ventricle. Cardiomegaly. No overt edema or confluent opacities. No effusions or acute bony abnormality. IMPRESSION: Cardiomegaly.  No acute cardiopulmonary disease. Electronically Signed   By: Charlett Nose M.D.   On: 05/30/2018 12:49   Dg Lumbar Spine 2-3  Views  Result Date: 05/30/2018 CLINICAL DATA:  Recent fall, diffuse pain EXAM: LUMBAR SPINE - 2-3  VIEW COMPARISON:  Lumbar spine films of 03/22/2017 and CT lumbar spine of 02/27/2017 FINDINGS: The old compression deformities of T12 and to a lesser degree T11 are again noted without interval change. No new compression deformity is seen. Lumbar vertebrae are unchanged in alignment with very slight anterolisthesis of L5 on S1 most likely due to degenerative change. Intervertebral disc spaces appear normal. Moderate abdominal aortic atherosclerosis is present. IMPRESSION: No new compression deformity is seen. No change in alignment and intervertebral disc spaces. Electronically Signed   By: Dwyane Dee M.D.   On: 05/30/2018 12:51   Dg Pelvis 1-2 Views  Result Date: 05/30/2018 CLINICAL DATA:  Recent fall, diffuse pain EXAM: PELVIS - 1-2 VIEW COMPARISON:  CT abdomen pelvis of 02/27/2017 FINDINGS: No hip fracture is seen. The bones are osteopenic in this elderly patient. There is irregularity of the right inferior pelvic ramus which may represent fracture, possibly old. Clinical correlation is recommended. The SI joints appear normal. IMPRESSION: 1. No hip fracture is seen. 2. There is abnormality of the inferior right pelvic ramus which represent an old fracture, but correlate clinically. Electronically Signed   By: Dwyane Dee M.D.   On: 05/30/2018 12:49   Dg Abd 1 View  Result Date: 06/17/2018 CLINICAL DATA:  Abdominal distension. EXAM: ABDOMEN - 1 VIEW COMPARISON:  Radiographs of May 30, 2018. FINDINGS: Large amount of stool is seen in the sigmoid colon and rectum concerning for impaction. No evidence of other bowel dilatation is noted. Atherosclerosis of abdominal aorta is noted. No abnormal calcifications are noted. IMPRESSION: Large stool burden is noted in the sigmoid colon and rectum concerning for impaction. No other bowel dilatation is noted. Aortic Atherosclerosis (ICD10-I70.0). Electronically  Signed   By: Lupita Raider, M.D.   On: 06/17/2018 18:47   Ct Head Wo Contrast  Result Date: 06/18/2018 CLINICAL DATA:  Altered mental status. EXAM: CT HEAD WITHOUT CONTRAST TECHNIQUE: Contiguous axial images were obtained from the base of the skull through the vertex without intravenous contrast. COMPARISON:  CT scan of June 16, 2018. FINDINGS: Brain: Stable small amount of hemorrhage seen in left posterior horn. Mild diffuse cortical atrophy. Mild chronic ischemic white matter disease. No mass effect or midline shift is noted. Ventricular size is within normal limits. There is no evidence of mass lesion, hemorrhage or acute infarction. Vascular: No hyperdense vessel or unexpected calcification. Skull: Normal. Negative for fracture or focal lesion. Sinuses/Orbits: Right frontal, ethmoid and maxillary sinusitis is again noted. Other: Stable left frontal scalp hematoma is noted. IMPRESSION: Stable small amount of hemorrhage seen in left posterior horn. No new hemorrhage is noted. Mild diffuse cortical atrophy. Mild chronic ischemic white matter disease. Stable chronic sinusitis. Electronically Signed   By: Lupita Raider, M.D.   On: 06/18/2018 13:21   Ct Head Wo Contrast  Result Date: 06/16/2018 CLINICAL DATA:  Left pupil not responsive to light. Follow-up intracranial hemorrhage. EXAM: CT HEAD WITHOUT CONTRAST TECHNIQUE: Contiguous axial images were obtained from the base of the skull through the vertex without intravenous contrast. COMPARISON:  06/13/2018. FINDINGS: Brain: Diffusely enlarged ventricles and subarachnoid spaces. Patchy white matter low density in both cerebral hemispheres. No significant change in a small amount of blood in the occipital horn of the left lateral ventricle. No new hemorrhage seen. No mass or evidence of acute infarction. Vascular: No hyperdense vessel or unexpected calcification. Skull: Mild bilateral hyperostosis frontalis. No skull fracture. Sinuses/Orbits: The right  frontal sinus remains completely opacified right anterior ethmoid sinus  mucosal thickening and complete opacification of the visualized right maxillary sinus, unchanged. Status post cataract extraction. Other: A left frontal scalp hematoma is slightly smaller. Left posterior scalp skin clips are again demonstrated. IMPRESSION: 1. No significant change in a small amount of blood in the occipital horn of the left lateral ventricle. 2. No new hemorrhage. 3. Stable atrophy and chronic small vessel white matter ischemic changes. 4. Stable chronic sinusitis. Electronically Signed   By: Beckie Salts M.D.   On: 06/16/2018 15:28   Ct Head Wo Contrast  Result Date: 06/13/2018 CLINICAL DATA:  Fall, altered level of consciousness EXAM: CT HEAD WITHOUT CONTRAST TECHNIQUE: Contiguous axial images were obtained from the base of the skull through the vertex without intravenous contrast. COMPARISON:  06/12/2018 FINDINGS: Brain: New since prior studies a small amount of layering blood in the posterior horn of the left lateral ventricle. No intraparenchymal hemorrhage. Advanced atrophy and chronic small vessel disease. Associated ventriculomegaly, stable. Vascular: No hyperdense vessel or unexpected calcification. Skull: No acute calvarial abnormality. Sinuses/Orbits: Complete opacification of the right frontal sinus, anterior ethmoid air cells, and right maxillary sinus. Other: Left scalp hematoma again noted, unchanged. IMPRESSION: New finding since prior study is a small amount of layering blood in the posterior horn of the left lateral ventricle. Atrophy, chronic small vessel disease. Electronically Signed   By: Charlett Nose M.D.   On: 06/13/2018 19:26   Ct Head Wo Contrast  Result Date: 06/12/2018 CLINICAL DATA:  Pain following trauma EXAM: CT HEAD WITHOUT CONTRAST CT CERVICAL SPINE WITHOUT CONTRAST TECHNIQUE: Multidetector CT imaging of the head and cervical spine was performed following the standard protocol without  intravenous contrast. Multiplanar CT image reconstructions of the cervical spine were also generated. COMPARISON:  CT head and CT cervical spine May 28, 2018; CT cervical spine March 03, 2018 FINDINGS: CT HEAD FINDINGS Brain: Moderate diffuse atrophy is stable. There is no intracranial mass, hemorrhage, extra-axial fluid collection, or midline shift. There is patchy small vessel disease in the centra semiovale bilaterally. No acute infarct is appreciable. Vascular: There is no hyperdense vessel. There is calcification in each carotid siphon region. Skull: Bony calvarium appears intact. There is a sizable left frontal scalp hematoma. There is a smaller left temporal scalp hematoma. There are skin staples in the anterior frontal vertex region midline. Sinuses/Orbits: There is opacification of most of the visualized right maxillary antrum. There is opacification of multiple anterior ethmoid air cells on the right as well as the right frontal sinus. Orbits appear symmetric bilaterally. Other: Mastoid air cells are clear. CT CERVICAL SPINE FINDINGS Alignment: There is 1 mm of anterolisthesis of C4 on C5. No other spondylolisthesis evident. Skull base and vertebrae: There is an acute fracture involving the posterior aspect of the right C4 lamina extending to the junction of the lamina and spinous process of C4 without appreciable displacement. Fracture does not extend into the canal in this region. No other acute fracture is demonstrable. There is chronic anterior wedging of the C5 and T2 vertebral bodies which appear stable. Bones are osteoporotic. There are no blastic or lytic bone lesions. Soft tissues and spinal canal: Prevertebral soft tissues and predental space regions are normal. There is no paraspinous lesion. There is no evident cord or canal hematoma. Disc levels: There is severe disc space narrowing at C5-6 and C6-7. There is facet hypertrophy at multiple levels bilaterally. There is moderate exit foraminal  narrowing at C3-4 on the right, at C4-5 on the right, and at C5-6  bilaterally due to bony hypertrophy. There is no frank disc extrusion or high-grade stenosis. Upper chest: Visualized upper lung zones are clear. Other: There is calcification in each carotid artery. IMPRESSION: CT head: Left-sided scalp hematomas, largest in the left frontal region. Skin staples anterior vertex region. No fracture. There is stable atrophy with periventricular small vessel disease. No acute infarct. No intracranial mass or hemorrhage. Multifocal paranasal sinus disease on the right. Foci of arterial vascular calcification noted. CT cervical spine: 1. Acute fracture involving the posterior aspect of the C4 lamina on the right extending to the junction of the C4 lamina and spinous process, essentially nondisplaced. No bony fragments extending into the canal. No other acute fracture. 2.  Stable prior wedge fractures at C5 and T2. 3.  Multilevel arthropathy. 4.  1 mm anterolisthesis of C4 on C5, likely due to spondylosis. 5.  Carotid artery calcification bilaterally. Critical Value/emergent results were called by telephone at the time of interpretation on 06/12/2018 at 7:21 am to Dr. Jaci Carrel , who verbally acknowledged these results. Electronically Signed   By: Bretta Bang III M.D.   On: 06/12/2018 07:21   Ct Head Wo Contrast  Result Date: 05/28/2018 CLINICAL DATA:  84 year old female with acute head and neck injury from fall today. EXAM: CT HEAD WITHOUT CONTRAST CT CERVICAL SPINE WITHOUT CONTRAST TECHNIQUE: Multidetector CT imaging of the head and cervical spine was performed following the standard protocol without intravenous contrast. Multiplanar CT image reconstructions of the cervical spine were also generated. COMPARISON:  03/31/2018 and prior CTs FINDINGS: CT HEAD FINDINGS Brain: No evidence of acute infarction, hemorrhage, hydrocephalus, extra-axial collection or mass lesion/mass effect. Atrophy and mild  chronic small-vessel white matter ischemic changes again noted. Vascular: Atherosclerotic calcifications again identified. Skull: Normal. Negative for fracture or focal lesion. Sinuses/Orbits: Opacified RIGHT frontal, anterior ethmoid and maxillary sinuses noted. Other: High scalp soft tissue swelling noted without fracture. CT CERVICAL SPINE FINDINGS Alignment: Normal. Skull base and vertebrae: No acute fracture. T2 compression fracture is again noted. No primary bone lesion or focal pathologic process. Soft tissues and spinal canal: No prevertebral fluid or swelling. No visible canal hematoma. Disc levels: Mild to moderate multilevel degenerative disc disease, spondylosis and facet arthropathy noted. Upper chest: No acute abnormality Other: None IMPRESSION: 1. No evidence of acute intracranial abnormality. Atrophy and chronic small-vessel white matter ischemic changes. 2. No static evidence of acute injury to the cervical spine. 3. Scalp soft tissue swelling without acute fracture. 4. Remote T2 compression fracture 5. RIGHT paranasal sinusitis. Electronically Signed   By: Harmon Pier M.D.   On: 05/28/2018 19:44   Ct Cervical Spine Wo Contrast  Result Date: 06/12/2018 CLINICAL DATA:  Pain following trauma EXAM: CT HEAD WITHOUT CONTRAST CT CERVICAL SPINE WITHOUT CONTRAST TECHNIQUE: Multidetector CT imaging of the head and cervical spine was performed following the standard protocol without intravenous contrast. Multiplanar CT image reconstructions of the cervical spine were also generated. COMPARISON:  CT head and CT cervical spine May 28, 2018; CT cervical spine March 03, 2018 FINDINGS: CT HEAD FINDINGS Brain: Moderate diffuse atrophy is stable. There is no intracranial mass, hemorrhage, extra-axial fluid collection, or midline shift. There is patchy small vessel disease in the centra semiovale bilaterally. No acute infarct is appreciable. Vascular: There is no hyperdense vessel. There is calcification in  each carotid siphon region. Skull: Bony calvarium appears intact. There is a sizable left frontal scalp hematoma. There is a smaller left temporal scalp hematoma. There are skin staples in  the anterior frontal vertex region midline. Sinuses/Orbits: There is opacification of most of the visualized right maxillary antrum. There is opacification of multiple anterior ethmoid air cells on the right as well as the right frontal sinus. Orbits appear symmetric bilaterally. Other: Mastoid air cells are clear. CT CERVICAL SPINE FINDINGS Alignment: There is 1 mm of anterolisthesis of C4 on C5. No other spondylolisthesis evident. Skull base and vertebrae: There is an acute fracture involving the posterior aspect of the right C4 lamina extending to the junction of the lamina and spinous process of C4 without appreciable displacement. Fracture does not extend into the canal in this region. No other acute fracture is demonstrable. There is chronic anterior wedging of the C5 and T2 vertebral bodies which appear stable. Bones are osteoporotic. There are no blastic or lytic bone lesions. Soft tissues and spinal canal: Prevertebral soft tissues and predental space regions are normal. There is no paraspinous lesion. There is no evident cord or canal hematoma. Disc levels: There is severe disc space narrowing at C5-6 and C6-7. There is facet hypertrophy at multiple levels bilaterally. There is moderate exit foraminal narrowing at C3-4 on the right, at C4-5 on the right, and at C5-6 bilaterally due to bony hypertrophy. There is no frank disc extrusion or high-grade stenosis. Upper chest: Visualized upper lung zones are clear. Other: There is calcification in each carotid artery. IMPRESSION: CT head: Left-sided scalp hematomas, largest in the left frontal region. Skin staples anterior vertex region. No fracture. There is stable atrophy with periventricular small vessel disease. No acute infarct. No intracranial mass or hemorrhage.  Multifocal paranasal sinus disease on the right. Foci of arterial vascular calcification noted. CT cervical spine: 1. Acute fracture involving the posterior aspect of the C4 lamina on the right extending to the junction of the C4 lamina and spinous process, essentially nondisplaced. No bony fragments extending into the canal. No other acute fracture. 2.  Stable prior wedge fractures at C5 and T2. 3.  Multilevel arthropathy. 4.  1 mm anterolisthesis of C4 on C5, likely due to spondylosis. 5.  Carotid artery calcification bilaterally. Critical Value/emergent results were called by telephone at the time of interpretation on 06/12/2018 at 7:21 am to Dr. Jaci Carrel , who verbally acknowledged these results. Electronically Signed   By: Bretta Bang III M.D.   On: 06/12/2018 07:21   Ct Cervical Spine Wo Contrast  Result Date: 05/28/2018 CLINICAL DATA:  84 year old female with acute head and neck injury from fall today. EXAM: CT HEAD WITHOUT CONTRAST CT CERVICAL SPINE WITHOUT CONTRAST TECHNIQUE: Multidetector CT imaging of the head and cervical spine was performed following the standard protocol without intravenous contrast. Multiplanar CT image reconstructions of the cervical spine were also generated. COMPARISON:  03/31/2018 and prior CTs FINDINGS: CT HEAD FINDINGS Brain: No evidence of acute infarction, hemorrhage, hydrocephalus, extra-axial collection or mass lesion/mass effect. Atrophy and mild chronic small-vessel white matter ischemic changes again noted. Vascular: Atherosclerotic calcifications again identified. Skull: Normal. Negative for fracture or focal lesion. Sinuses/Orbits: Opacified RIGHT frontal, anterior ethmoid and maxillary sinuses noted. Other: High scalp soft tissue swelling noted without fracture. CT CERVICAL SPINE FINDINGS Alignment: Normal. Skull base and vertebrae: No acute fracture. T2 compression fracture is again noted. No primary bone lesion or focal pathologic process. Soft  tissues and spinal canal: No prevertebral fluid or swelling. No visible canal hematoma. Disc levels: Mild to moderate multilevel degenerative disc disease, spondylosis and facet arthropathy noted. Upper chest: No acute abnormality Other: None IMPRESSION: 1.  No evidence of acute intracranial abnormality. Atrophy and chronic small-vessel white matter ischemic changes. 2. No static evidence of acute injury to the cervical spine. 3. Scalp soft tissue swelling without acute fracture. 4. Remote T2 compression fracture 5. RIGHT paranasal sinusitis. Electronically Signed   By: Harmon Pier M.D.   On: 05/28/2018 19:44   Dg Chest Port 1 View  Result Date: 06/14/2018 CLINICAL DATA:  Lung crackles EXAM: PORTABLE CHEST 1 VIEW COMPARISON:  06/13/2018 FINDINGS: Dual-chamber pacer leads from the left are in stable position. Chronic cardiomegaly. There is no edema, consolidation, effusion, or pneumothorax. Stable interstitial coarsening. Artifact from overlapping hardware. IMPRESSION: Cardiomegaly without failure. Electronically Signed   By: Marnee Spring M.D.   On: 06/14/2018 19:25   Dg Knee Complete 4 Views Right  Result Date: 06/12/2018 CLINICAL DATA:  Fall tonight with pain. EXAM: RIGHT KNEE - COMPLETE 4+ VIEW COMPARISON:  Radiograph 02/20/2018 FINDINGS: Remote distal femur fracture with lateral plate and screw fixation. No acute or periprosthetic fracture. Proximal aspect of the hardware is not entirely included in the field of view. Osteoarthritis with tibiofemoral joint space narrowing and peripheral spurring, medial greater than lateral. No large joint effusion, hardware partially obscures evaluation. The bones are under mineralized. Advanced vascular calcifications. IMPRESSION: Postsurgical and degenerative change of the right knee without acute fracture. Electronically Signed   By: Narda Rutherford M.D.   On: 06/12/2018 06:50   Dg Hip Unilat W Or Wo Pelvis 2-3 Views Right  Result Date: 06/12/2018 CLINICAL  DATA:  Fall tonight with right hip pain. EXAM: DG HIP (WITH OR WITHOUT PELVIS) 2-3V RIGHT COMPARISON:  Pelvis and right hip radiographs 02/27/2018 FINDINGS: The cortical margins of the bony pelvis and right hip are intact. No fracture. The bones are under mineralized. Pubic symphysis and sacroiliac joints are congruent. Both femoral heads are well-seated in the respective acetabula. Lateral plate and screw in the mid femoral shaft partially included. Advanced vascular calcifications. Stool distends the rectum. IMPRESSION: No fracture of the pelvis or right hip. Electronically Signed   By: Narda Rutherford M.D.   On: 06/12/2018 06:48    Microbiology: Recent Results (from the past 240 hour(s))  MRSA PCR Screening     Status: None   Collection Time: 06/14/18  2:01 AM  Result Value Ref Range Status   MRSA by PCR NEGATIVE NEGATIVE Final    Comment:        The GeneXpert MRSA Assay (FDA approved for NASAL specimens only), is one component of a comprehensive MRSA colonization surveillance program. It is not intended to diagnose MRSA infection nor to guide or monitor treatment for MRSA infections. Performed at [facility], [address], [place], [place] [address]      Labs: Basic Metabolic Panel: Recent Labs  Lab 06/15/18 [phone] 06/16/18 0627 06/17/18 0533 06/18/18 0451 06/19/18 0602  NA 144 142 142 144 145  K 4.5 4.5 4.5 4.5 5.2*  CL 112* 111 111 111 111  CO2 27 25 27 27 30   GLUCOSE 101* 92 99 96 100*  BUN 37* 42* 41* 34* 43*  CREATININE 1.20* 1.31* 1.22* 1.14* 1.50*  CALCIUM 8.9 8.8* 8.9 8.9 8.7*  MG 2.2 2.4 2.4 2.6* 2.7*   Liver Function Tests: Recent Labs  Lab 06/17/18 0533  AST 20  ALT 15  ALKPHOS 130*  BILITOT 0.5  PROT 5.7*  ALBUMIN 2.9*   Recent Labs  Lab 06/17/18 0533  LIPASE 38   No results for input(s): AMMONIA in the  last 168 hours. CBC: Recent Labs  Lab 06/13/18 1829 06/15/18 1610 06/16/18 0627 06/17/18 0533 06/18/18 0451 06/19/18 0602   WBC 7.0 6.9 9.2 8.6 7.4 6.8  NEUTROABS 5.3  --   --   --   --   --   HGB 14.3 10.7* 10.3* 10.5* 10.6* 10.3*  HCT 47.2* 34.8* 32.9* 34.0* 35.3* 34.1*  MCV 100.4* 101.2* 100.0 100.3* 101.1* 102.1*  PLT 177 197 184 176 175 160   Cardiac Enzymes: Recent Labs  Lab 06/13/18 1829 06/14/18 0623 06/14/18 1154 06/14/18 1743  TROPONINI 0.03* 0.03* 0.03* <0.03   BNP: BNP (last 3 results) Recent Labs    03/12/18 0209 03/27/18 1804  BNP 109.8* 231.1*    ProBNP (last 3 results) No results for input(s): PROBNP in the last 8760 hours.  CBG: Recent Labs  Lab 06/13/18 1827  GLUCAP 116*       Signed:  Hollice Espy, MD [facility] 06/19/2018, 12:30 PM

## 2018-06-19 NOTE — Progress Notes (Signed)
Patient being transferred to SNF this day. Family made aware. Report called to facility. All personal belongings with patient.  C-Collar on at present.  Pt demonstrates no s/sx of distress.

## 2018-06-19 NOTE — Plan of Care (Signed)
  Problem: Elimination: Goal: Will not experience complications related to bowel motility Outcome: Progressing Goal: Will not experience complications related to urinary retention Outcome: Progressing   Problem: Safety: Goal: Ability to remain free from injury will improve Outcome: Progressing   

## 2018-06-19 NOTE — Clinical Social Work Placement (Signed)
   CLINICAL SOCIAL WORK PLACEMENT  NOTE  Date:  06/19/2018  Patient Details  Name: Jennifer Bender MRN: [id number] Date of Birth: [birth year]  Clinical Social Work is seeking post-discharge placement for this patient at the Skilled  Nursing Facility level of care (*CSW will initial, date and re-position this form in  chart as items are completed):  Yes   Patient/family provided with  [facility] Clinical Social Work Department's list of facilities offering this level of care within the geographic area requested by the patient (or if unable, by the patient's family).  Yes   Patient/family informed of their freedom to choose among providers that offer the needed level of care, that participate in Medicare, Medicaid or managed care program needed by the patient, have an available bed and are willing to accept the patient.  Yes   Patient/family informed of [facility]'s ownership interest in [facility] and [facility], as well as of the fact that they are under no obligation to receive care at these facilities.  PASRR submitted to EDS on 06/17/18     PASRR number received on       Existing PASRR number confirmed on 06/17/18     FL2 transmitted to all facilities in geographic area requested by pt/family on 06/18/18     FL2 transmitted to all facilities within larger geographic area on       Patient informed that his/her managed care company has contracts with or will negotiate with certain facilities, including the following:        Yes   Patient/family informed of bed offers received.  Patient chooses bed at [facility]     Physician recommends and patient chooses bed at      Patient to be transferred to [facility] on 06/19/18.  Patient to be transferred to facility by [facility]     Patient family notified on 06/19/18 of transfer.  Name of family member notified:  Dayna Barker     PHYSICIAN       Additional Comment:     _______________________________________________ Margarito Liner, LCSW 06/19/2018, 12:58 PM

## 2018-06-19 NOTE — Clinical Social Work Note (Signed)
CSW facilitated patient discharge including contacting patient family and facility to confirm patient discharge plans. Clinical information faxed to facility and family agreeable with plan. CSW arranged ambulance transport via [facility] to [facility]. RN has already called report.  CSW will sign off for now as social work intervention is no longer needed. Please consult us again if new needs arise.  Calin Fantroy, CSW [phone]  

## 2018-06-25 ENCOUNTER — Encounter: Payer: Self-pay | Admitting: Internal Medicine

## 2018-06-25 ENCOUNTER — Non-Acute Institutional Stay: Payer: Medicare Other | Admitting: Internal Medicine

## 2018-06-25 VITALS — BP 108/70 | HR 64 | Resp 16 | Ht 64.0 in | Wt 112.6 lb

## 2018-06-25 DIAGNOSIS — G301 Alzheimer's disease with late onset: Secondary | ICD-10-CM

## 2018-06-25 DIAGNOSIS — R634 Abnormal weight loss: Secondary | ICD-10-CM

## 2018-06-25 DIAGNOSIS — R531 Weakness: Secondary | ICD-10-CM

## 2018-06-25 DIAGNOSIS — F0281 Dementia in other diseases classified elsewhere with behavioral disturbance: Secondary | ICD-10-CM

## 2018-06-25 DIAGNOSIS — Z515 Encounter for palliative care: Secondary | ICD-10-CM

## 2018-06-25 NOTE — Progress Notes (Addendum)
PALLIATIVE CARE CONSULT VISIT   PATIENT NAME: Jennifer Bender DOB: July 16, 1924 MRN: [id number]  PRIMARY CARE PROVIDER:   [facility] facility provider REFERRING PROVIDER:  Coral Ceo PA Joetta Manners) RESPONSIBLE PARTY:   (son) Lollie Sails [phone], ([phone]. (dtr) Grenda Gosnell [phone], (gnd drt) [name] [phone].   RECOMMENDATIONS and PLAN:  1. Moderate Protein Calorie Malnutrition: Current weight (06/20/2018) is 112.6lbs. This is a weight loss of 13.4lbs (11% of body weight) over the last 5 months. Height 5'4" for a BMI of 19.3kg/m2. Variable appetite; few bites to 25% of meals. She has had a 14% weight loss in 5 months, with evidence of moderated muscle and fat depletion.   2. Alzheimer's dementia with behavioral disturbances: PPS 30%. FAST 6d. She is mostly incontinent or urine, but continent of stool (will alert staff). She is constantly confused (oriented to self only) and can be agitated and combative usually late eve or during the night. Staff report they can usually redirect her. I discussed potential use of mood stabilizers with son Lollie Sails, but he is really reluctant to do so at this time due to past experience with exacerbation of patient confusion with certain medications.   3. Weakness: She needs assist to stand and can ambulate with walker a few steps with assist. Working with facility PT/OT. She has poor safety awareness and fell reaching out for an item while in her wheelchair. F/U left arm X-Ray (for c/o pain) was negative for fracture.   4. Pain: She has chronic bilateral arthritic pain in UEs L>R. She is supposed to be wearing her neck collar but will not keep it on. She denies neck pain.   5. Constipation: LBM 2 days ago; constipation has not been a problem. Son mentions successfully managed in past with fruits/MOM.  6. Wounds: She has a left lateral upper shin skin tear about quarter sized without signs infection/inflamation. She has left temporal  raised swelling/bruising from fall just previous to recent hospitalization.  7. Goals of Care/Advanced care directives: DNR in place (per nursing report; chart unavailable).  I spoke to patient's son Jake Shark Lollie Sails [phone]), who confirmed DNR. We discussed potential future hospitalizations and, should patient need intubation/ventilation or ICU, would family wish for that? Lollie Sails wasn't sure; wished to discuss further with his sister Steward Drone and her daughter Selena Batten. He declined setting up a family meeting with me at this time, but requested PC continue to follow patient at [facility] place after discharge from [facility] for further discussion. Also requested that I mail education materials regarding MOST form to his home. [address], [place] [address]).    8. Follow up: PC NP visit in 1-2 months; ongoing assessment for home hospice eligibility and ongoing clarification of goals of care.   I spent 55 minutes providing this consultation,  from 10am to 10:55am. More than 50% of the time in this consultation was spent coordinating communication.   HISTORY OF PRESENT ILLNESS:  Jennifer Bender is a 84 y.o. year old female with history significant for dementia, atrial fibrillation (no anticoagulants due to IVH and high risk of falls), PPM, CKD IV, CHF, hypothyroidism, malnutrition (moderate) and HTN. She is s/p recent hospital admission 10/24-10/30/2019 for syncope. She sustained an intraventricular hemorrhage. She had previous hospitalization 06/12/2018 when, after a fall, she sustained a nondisplaced C4 lamina and spinous process fracture. She was placed in an Aspen collar and felt not a good candidate for surgery (neriosurg consult) due to her age and comorbidities.  Palliative Care was asked to help address goals of care.   CODE STATUS: DNR  PPS: 30% HOSPICE ELIGIBILITY/DIAGNOSIS: TBD; pending further weight loss and if further functional decline. Current rehab stay and working with PT/OT.  PAST MEDICAL  HISTORY:  Past Medical History:  Diagnosis Date  . Arthritis   . Atrial fibrillation (HCC)   . CHF (congestive heart failure) (HCC)   . Hypertension   . Pneumonia   . UTI (lower urinary tract infection)     SOCIAL HX:  Social History   Tobacco Use  . Smoking status: Never Smoker  . Smokeless tobacco: Never Used  Substance Use Topics  . Alcohol use: No    ALLERGIES:  Allergies  Allergen Reactions  . Ace Inhibitors     Angioedema   . Colchicine     unknown  . Crestor [Rosuvastatin Calcium]     Myalgias   . Ezetimibe Other (See Comments)  . Hydrocodone-Acetaminophen Nausea Only  . Lipitor [Atorvastatin Calcium]     Myalgias   . Morphine And Related Other (See Comments)    Unknown  . Nsaids Nausea And Vomiting    Renal insufficiency  Renal sufficient  . Pravachol Nausea Only  . Rosuvastatin Other (See Comments)    Unknown  . Shellfish Allergy Other (See Comments)  . Tolmetin Other (See Comments)    Renal insufficiency   . Tramadol Nausea Only  . Zocor [Simvastatin]     Myalgias      PERTINENT MEDICATIONS:  Outpatient Encounter Medications as of 06/25/2018  Medication Sig  . acetaminophen (TYLENOL) 325 MG tablet Take 650 mg by mouth 4 (four) times daily as needed for mild pain.   Marland Kitchen diltiazem (DILACOR XR) 180 MG 24 hr capsule Take 180 mg by mouth daily.  . DULoxetine (CYMBALTA) 30 MG capsule Take 30 mg by mouth daily.  . feeding supplement, ENSURE ENLIVE, (ENSURE ENLIVE) LIQD Take 237 mLs by mouth 2 (two) times daily between meals.  . fish oil-omega-3 fatty acids 1000 MG capsule Take 1 g by mouth daily.   Marland Kitchen levothyroxine (SYNTHROID, LEVOTHROID) 88 MCG tablet Take 88 mcg by mouth daily before breakfast.  . Melatonin 5 MG TABS Take 5 mg by mouth at bedtime as needed (sleep).  . Menthol, Topical Analgesic, (BLUE GEL) 2 % GEL Apply 1 application topically 3 (three) times daily.  . Multiple Vitamin (DAILY VITE PO) Take 1 tablet by mouth daily.  . polyethylene glycol  (MIRALAX / GLYCOLAX) packet Take 17 g by mouth daily.  . potassium chloride (K-DUR,KLOR-CON) 10 MEQ tablet Take 10 mEq by mouth daily.  . Skin Protectants, Misc. (EUCERIN) cream Apply 1 application topically at bedtime.   No facility-administered encounter medications on file as of 06/25/2018.     PHYSICAL EXAM:  VS: BP 108/70, HR 64 (regular), RR 16 General: NAD, frail appearing, thin, confused but pleasant.  Cardiovascular: regular rate and rhythm without MRG Pulmonary: clear ant fields. Posterior bilateral bases soft insp crackles Abdomen: soft, nontender, + bowel sounds GU: no suprapubic tenderness Extremities: no edema, no joint deformities. Diminished pedal pulses Skin: no rashes. Quarter sized stage 2 wound left lateral upper shin; dressing in place. Left temporal swelling/brusing  Neurological: Weakness but otherwise non-focal  Anselm Lis, NP

## 2018-07-13 ENCOUNTER — Emergency Department (HOSPITAL_COMMUNITY): Payer: Medicare Other

## 2018-07-13 ENCOUNTER — Emergency Department (HOSPITAL_COMMUNITY)
Admission: EM | Admit: 2018-07-13 | Discharge: 2018-07-13 | Disposition: A | Discharge status: Skilled Nursing Facility | Payer: Medicare Other | Attending: Emergency Medicine | Admitting: Emergency Medicine

## 2018-07-13 ENCOUNTER — Encounter (HOSPITAL_COMMUNITY): Payer: Self-pay

## 2018-07-13 ENCOUNTER — Other Ambulatory Visit: Payer: Self-pay

## 2018-07-13 DIAGNOSIS — Z4802 Encounter for removal of sutures: Secondary | ICD-10-CM | POA: Diagnosis not present

## 2018-07-13 DIAGNOSIS — I110 Hypertensive heart disease with heart failure: Secondary | ICD-10-CM | POA: Insufficient documentation

## 2018-07-13 DIAGNOSIS — S0181XA Laceration without foreign body of other part of head, initial encounter: Secondary | ICD-10-CM

## 2018-07-13 DIAGNOSIS — I4891 Unspecified atrial fibrillation: Secondary | ICD-10-CM | POA: Diagnosis not present

## 2018-07-13 DIAGNOSIS — Y92199 Unspecified place in other specified residential institution as the place of occurrence of the external cause: Secondary | ICD-10-CM | POA: Diagnosis not present

## 2018-07-13 DIAGNOSIS — M25552 Pain in left hip: Secondary | ICD-10-CM | POA: Diagnosis not present

## 2018-07-13 DIAGNOSIS — N184 Chronic kidney disease, stage 4 (severe): Secondary | ICD-10-CM | POA: Diagnosis not present

## 2018-07-13 DIAGNOSIS — Y9389 Activity, other specified: Secondary | ICD-10-CM | POA: Diagnosis not present

## 2018-07-13 DIAGNOSIS — W06XXXA Fall from bed, initial encounter: Secondary | ICD-10-CM | POA: Insufficient documentation

## 2018-07-13 DIAGNOSIS — Y998 Other external cause status: Secondary | ICD-10-CM | POA: Insufficient documentation

## 2018-07-13 DIAGNOSIS — F0390 Unspecified dementia without behavioral disturbance: Secondary | ICD-10-CM | POA: Diagnosis not present

## 2018-07-13 DIAGNOSIS — W19XXXA Unspecified fall, initial encounter: Secondary | ICD-10-CM

## 2018-07-13 DIAGNOSIS — I509 Heart failure, unspecified: Secondary | ICD-10-CM | POA: Diagnosis not present

## 2018-07-13 DIAGNOSIS — S0990XA Unspecified injury of head, initial encounter: Secondary | ICD-10-CM | POA: Diagnosis present

## 2018-07-13 DIAGNOSIS — R52 Pain, unspecified: Secondary | ICD-10-CM

## 2018-07-13 LAB — CBC WITH DIFFERENTIAL/PLATELET
Abs Immature Granulocytes: 0.04 10*3/uL (ref 0.00–0.07)
BASOS ABS: 0.1 10*3/uL (ref 0.0–0.1)
BASOS PCT: 1 %
Eosinophils Absolute: 0.2 10*3/uL (ref 0.0–0.5)
Eosinophils Relative: 2 %
HCT: 32.7 % — ABNORMAL LOW (ref 36.0–46.0)
HEMOGLOBIN: 9.9 g/dL — AB (ref 12.0–15.0)
Immature Granulocytes: 1 %
LYMPHS PCT: 18 %
Lymphs Abs: 1.4 10*3/uL (ref 0.7–4.0)
MCH: 31.1 pg (ref 26.0–34.0)
MCHC: 30.3 g/dL (ref 30.0–36.0)
MCV: 102.8 fL — ABNORMAL HIGH (ref 80.0–100.0)
MONO ABS: 1.1 10*3/uL — AB (ref 0.1–1.0)
Monocytes Relative: 13 %
NEUTROS ABS: 5.3 10*3/uL (ref 1.7–7.7)
NRBC: 0 % (ref 0.0–0.2)
Neutrophils Relative %: 65 %
PLATELETS: 219 10*3/uL (ref 150–400)
RBC: 3.18 MIL/uL — AB (ref 3.87–5.11)
RDW: 13.5 % (ref 11.5–15.5)
WBC: 8 10*3/uL (ref 4.0–10.5)

## 2018-07-13 LAB — BASIC METABOLIC PANEL
Anion gap: 5 (ref 5–15)
BUN: 28 mg/dL — AB (ref 8–23)
CO2: 26 mmol/L (ref 22–32)
Calcium: 8.6 mg/dL — ABNORMAL LOW (ref 8.9–10.3)
Chloride: 111 mmol/L (ref 98–111)
Creatinine, Ser: 0.99 mg/dL (ref 0.44–1.00)
GFR, EST AFRICAN AMERICAN: 56 mL/min — AB (ref >60)
GFR, EST NON AFRICAN AMERICAN: 48 mL/min — AB (ref >60)
Glucose, Bld: 96 mg/dL (ref 70–99)
POTASSIUM: 4.4 mmol/L (ref 3.5–5.1)
SODIUM: 142 mmol/L (ref 135–145)

## 2018-07-13 MED ORDER — BACITRACIN ZINC 500 UNIT/GM EX OINT
TOPICAL_OINTMENT | CUTANEOUS | Status: AC
  Administered 2018-07-13: 07:00:00
  Filled 2018-07-13: qty 0.9

## 2018-07-13 NOTE — ED Triage Notes (Signed)
Per EMS, Pt coming [facility]. Pt rolled out of the bed while sleeping. Pt complaining of head, neck, left knee pain. Pt has a contusion to the left side of the head No loss of consciousness, A&Ox3. Pt has some confusion, and a history of dementia. Pt also has history of neck injury.

## 2018-07-13 NOTE — ED Notes (Signed)
Went in room with MD and JOsh EMT, to assist with cleaning of wound to left forehead. Dr Eudelia Bunchardama removed staples from a previous fall that have been too long. Pt was screaming, while staff was holding pt's hands while EDP was cleaning head and removing staples.   Pt had tight grip on this RN's hands leaving fingernail indention on left hand.

## 2018-07-13 NOTE — Discharge Instructions (Addendum)
Laceration on the forehead will be allowed to close by secondary closure.  Continue to apply moist ointment daily.

## 2018-07-13 NOTE — ED Notes (Signed)
[facility] Expected date:  Expected time:  Means of arrival:  Comments: EMS 84 yo female from SNF-rolled out of bed-contusion left frontal-knee pain-hx previous neck injury

## 2018-07-13 NOTE — ED Notes (Signed)
Pt returned from radiology.

## 2018-07-13 NOTE — ED Notes (Signed)
Went in room to removed dressing off patient's head and clean laceration. Pt grabbing at this nurse's hands and wouldn't let go or allow this RN to remove bandage at this time.  Will have to get more staff to help with cleaning pt's wound.

## 2018-07-13 NOTE — ED Notes (Signed)
[facility] notified about [facility].

## 2018-07-13 NOTE — ED Notes (Signed)
This writer went into PT rm to update vitals and remove IV. PT attempt to hit staff while screaming. Another staff member enter rm to assist with IV removal. Unable to update vitals at this time

## 2018-07-13 NOTE — ED Notes (Signed)
Bed: [id number] Expected date: 07/13/18 Expected time: 7:00 AM Means of arrival:  Comments:

## 2018-07-13 NOTE — ED Provider Notes (Signed)
[facility] Provider Note  CSN: [id number] Arrival date & time: 07/13/18 0116  Chief Complaint(s) Fall  Triage Note 0113 Per EMS, [facility]. Pt rolled out of the bed while sleeping. Pt complaining of head, neck, left knee pain. Pt has a contusion to the left side of the head No loss of consciousness, A&Ox3. Pt has some confusion, and a history of dementia. Pt also has history of neck injury.     HPI Jennifer Bender is a 84 y.o. female who presented for unwitnessed fall.  Patient believed that she was at home and tripped on carpet.  Remainder of history, ROS, and physical exam limited due to patient's condition (dementia). Additional information was obtained from EMS.   Level V Caveat.    HPI    Past Medical History Past Medical History:  Diagnosis Date  . Arthritis   . Atrial fibrillation (HCC)   . CHF (congestive heart failure) (HCC)   . Hypertension   . Pneumonia   . UTI (lower urinary tract infection)    Patient Active Problem List   Diagnosis Date Noted  . Atrial fibrillation (HCC) 06/14/2018  . CKD (chronic kidney disease) stage 4, GFR 15-29 ml/min (HCC) 06/14/2018  . CHF (congestive heart failure) (HCC) 06/14/2018  . HTN (hypertension) 06/14/2018  . C4 cervical fracture (HCC) 06/14/2018  . Hypothyroidism 06/14/2018  . Malnutrition of moderate degree 06/14/2018  . Syncope 06/13/2018   Home Medication(s) Prior to Admission medications   Medication Sig Start Date End Date Taking? Authorizing Provider  acetaminophen (TYLENOL) 325 MG tablet Take 650 mg by mouth 4 (four) times daily as needed for mild pain.    Yes [provider]  diltiazem (DILACOR XR) 180 MG 24 hr capsule Take 180 mg by mouth daily.   Yes [provider]  DULoxetine (CYMBALTA) 30 MG capsule Take 30 mg by mouth daily.   Yes [provider]  fish oil-omega-3 fatty acids 1000 MG capsule Take 1 g by mouth  daily.    Yes [provider]  levothyroxine (SYNTHROID, LEVOTHROID) 88 MCG tablet Take 88 mcg by mouth daily before breakfast.   Yes [provider]  Melatonin 5 MG TABS Take 5 mg by mouth at bedtime as needed (sleep).   Yes [provider]  memantine (NAMENDA) 5 MG tablet Take 5 mg by mouth daily.   Yes [provider]  Menthol, Topical Analgesic, (BLUE GEL) 2 % GEL Apply 1 application topically 3 (three) times daily.   Yes [provider]  Multiple Vitamin (DAILY VITE PO) Take 1 tablet by mouth daily.   Yes [provider]  polyethylene glycol (MIRALAX / GLYCOLAX) packet Take 17 g by mouth daily. 06/17/18  Yes Zigmund DanielPowell, A Caldwell Jr., MD  potassium chloride (K-DUR,KLOR-CON) 10 MEQ tablet Take 10 mEq by mouth daily.   Yes [provider]  feeding supplement, ENSURE ENLIVE, (ENSURE ENLIVE) LIQD Take 237 mLs by mouth 2 (two) times daily between meals. Patient not taking: Reported on 07/13/2018 06/17/18   Zigmund DanielPowell, A Caldwell Jr., MD  Skin Protectants, Misc. (EUCERIN) cream Apply 1 application topically at bedtime.    [provider]  Past Surgical History Past Surgical History:  Procedure Laterality Date  . ABDOMINAL HYSTERECTOMY    . BREAST REDUCTION SURGERY    . CARDIOVERSION    . HAND SURGERY     Family History No family history on file.  Social History Social History   Tobacco Use  . Smoking status: Never Smoker  . Smokeless tobacco: Never Used  Substance Use Topics  . Alcohol use: No  . Drug use: No   Allergies Ace inhibitors; Colchicine; Crestor [rosuvastatin calcium]; Ezetimibe; Hydrocodone-acetaminophen; Lipitor [atorvastatin calcium]; Morphine and related; Nsaids; Pravachol; Rosuvastatin; Shellfish allergy; Tolmetin; Tramadol; and Zocor [simvastatin]  Review of Systems Review  of Systems  Unable to perform ROS: Dementia     Physical Exam Vital Signs  I have reviewed the triage vital signs BP 138/76 (BP Location: Left Arm)   Pulse 87   Temp 98.1 F (36.7 C) (Oral)   Resp 20   Ht 5\' 6"  (1.676 m)   Wt 54.4 kg   SpO2 100%   BMI 19.37 kg/m   Physical Exam  Constitutional: She is oriented to person, place, and time. She appears well-developed and well-nourished. No distress.  HENT:  Head: Normocephalic. Head is with contusion and with laceration.    Right Ear: External ear normal.  Left Ear: External ear normal.  Nose: Nose normal.  Eyes: Pupils are equal, round, and reactive to light. Conjunctivae and EOM are normal. Right eye exhibits no discharge. Left eye exhibits no discharge. No scleral icterus.  Neck: Normal range of motion. Neck supple.  Cardiovascular: Normal rate, regular rhythm and normal heart sounds. Exam reveals no gallop and no friction rub.  No murmur heard. Pulses:      Radial pulses are 2+ on the right side, and 2+ on the left side.       Dorsalis pedis pulses are 2+ on the right side, and 2+ on the left side.  Pulmonary/Chest: Effort normal and breath sounds normal. No stridor. No respiratory distress. She has no wheezes.  Abdominal: Soft. She exhibits no distension. There is no tenderness.  Musculoskeletal: She exhibits no edema or tenderness.       Cervical back: She exhibits no bony tenderness.       Thoracic back: She exhibits no bony tenderness.       Lumbar back: She exhibits no bony tenderness.  Clavicles stable. Chest stable to AP/Lat compression. Pelvis stable to Lat compression. No obvious extremity deformity. No chest or abdominal wall contusion.  Neurological: She is alert and oriented to person, place, and time.  Moving all extremities  Skin: Skin is warm and dry. No rash noted. She is not diaphoretic. No erythema.  Psychiatric: She has a normal mood and affect.    ED Results and Treatments Labs (all labs  ordered are listed, but only abnormal results are displayed) Labs Reviewed  CBC WITH DIFFERENTIAL/PLATELET - Abnormal; Notable for the following components:      Result Value   RBC 3.18 (*)    Hemoglobin 9.9 (*)    HCT 32.7 (*)    MCV 102.8 (*)    Monocytes Absolute 1.1 (*)    All other components within normal limits  BASIC METABOLIC PANEL - Abnormal; Notable for the following components:   BUN 28 (*)    Calcium 8.6 (*)    GFR calc non Af Amer 48 (*)    GFR calc Af Amer 56 (*)    All other components within normal limits  EKG  EKG Interpretation  Date/Time:  Saturday July 13 2018 03:46:40 EST Ventricular Rate:  87 PR Interval:    QRS Duration: 149 QT Interval:  401 QTC Calculation: 483 R Axis:   98 Text Interpretation:  Atrial fibrillation Right bundle branch block Baseline wander in lead(s) V2 No significant change since last tracing Confirmed by Drema Pry [phone]) on 07/13/2018 5:00:01 AM      Radiology Ct Head Wo Contrast  Result Date: 07/13/2018 CLINICAL DATA:  Fall from bed. EXAM: CT HEAD WITHOUT CONTRAST CT CERVICAL SPINE WITHOUT CONTRAST TECHNIQUE: Multidetector CT imaging of the head and cervical spine was performed following the standard protocol without intravenous contrast. Multiplanar CT image reconstructions of the cervical spine were also generated. COMPARISON:  Head CT 06/18/2018 CT cervical spine 06/12/2018 FINDINGS: CT HEAD FINDINGS Brain: There is no mass, hemorrhage or extra-axial collection. There is generalized atrophy without lobar predilection. There is hypoattenuation of the periventricular white matter, most commonly indicating chronic ischemic microangiopathy. Vascular: Atherosclerotic calcification of the internal carotid arteries at the skull base. No abnormal hyperdensity of the major intracranial arteries or dural venous  sinuses. Skull: Small left frontal scalp hematoma.  No skull fracture. Sinuses/Orbits: Complete opacification of the right frontal, maxillary and anterior ethmoid sinuses. The orbits are normal. CT CERVICAL SPINE FINDINGS Alignment: No static subluxation. Facets are aligned. Occipital condyles are normally positioned. Skull base and vertebrae: No acute fracture. There are unchanged compression deformities of C5 and T2. Fractures through the posterior elements at C4 are unchanged. Soft tissues and spinal canal: No prevertebral fluid or swelling. No visible canal hematoma. Disc levels: No advanced spinal canal or neural foraminal stenosis. Upper chest: No pneumothorax, pulmonary nodule or pleural effusion. Other: Normal visualized paraspinal cervical soft tissues. IMPRESSION: 1. No acute intracranial abnormality. 2. Small left frontal scalp hematoma without skull fracture. 3. No acute fracture or static subluxation of the cervical spine. 4. Unchanged fracture through the posterior elements of C4 compared to 06/12/2018. Electronically Signed   By: Deatra Robinson M.D.   On: 07/13/2018 04:10   Ct Cervical Spine Wo Contrast  Result Date: 07/13/2018 CLINICAL DATA:  Fall from bed. EXAM: CT HEAD WITHOUT CONTRAST CT CERVICAL SPINE WITHOUT CONTRAST TECHNIQUE: Multidetector CT imaging of the head and cervical spine was performed following the standard protocol without intravenous contrast. Multiplanar CT image reconstructions of the cervical spine were also generated. COMPARISON:  Head CT 06/18/2018 CT cervical spine 06/12/2018 FINDINGS: CT HEAD FINDINGS Brain: There is no mass, hemorrhage or extra-axial collection. There is generalized atrophy without lobar predilection. There is hypoattenuation of the periventricular white matter, most commonly indicating chronic ischemic microangiopathy. Vascular: Atherosclerotic calcification of the internal carotid arteries at the skull base. No abnormal hyperdensity of the major  intracranial arteries or dural venous sinuses. Skull: Small left frontal scalp hematoma.  No skull fracture. Sinuses/Orbits: Complete opacification of the right frontal, maxillary and anterior ethmoid sinuses. The orbits are normal. CT CERVICAL SPINE FINDINGS Alignment: No static subluxation. Facets are aligned. Occipital condyles are normally positioned. Skull base and vertebrae: No acute fracture. There are unchanged compression deformities of C5 and T2. Fractures through the posterior elements at C4 are unchanged. Soft tissues and spinal canal: No prevertebral fluid or swelling. No visible canal hematoma. Disc levels: No advanced spinal canal or neural foraminal stenosis. Upper chest: No pneumothorax, pulmonary nodule or pleural effusion. Other: Normal visualized paraspinal cervical soft tissues. IMPRESSION: 1. No acute intracranial abnormality. 2. Small left frontal scalp hematoma without skull fracture.  3. No acute fracture or static subluxation of the cervical spine. 4. Unchanged fracture through the posterior elements of C4 compared to 06/12/2018. Electronically Signed   By: Deatra Robinson M.D.   On: 07/13/2018 04:10   Dg Hip Unilat With Pelvis 2-3 Views Left  Result Date: 07/13/2018 CLINICAL DATA:  Fall from bed while sleeping. EXAM: DG HIP (WITH OR WITHOUT PELVIS) 2-3V LEFT COMPARISON:  03/22/2018 FINDINGS: There is no evidence of hip fracture or dislocation. There is no evidence of arthropathy or other focal bone abnormality. IMPRESSION: No fracture or dislocation of the pelvis or left hip. Electronically Signed   By: Deatra Robinson M.D.   On: 07/13/2018 03:58   Pertinent labs & imaging results that were available during my care of the patient were reviewed by me and considered in my medical decision making (see chart for details).  Medications Ordered in ED Medications  bacitracin 500 UNIT/GM ointment (  Given by Other 07/13/18 0718)                                                                                                                                     Procedures Procedures  (including critical care time)  Medical Decision Making / ED Course I have reviewed the nursing notes for this encounter and the patient's prior records (if available in EHR or on provided paperwork).    Unwitnessed fall at a skilled nursing facility.  No anticoagulation noted on MAR.  On exam patient has forehead laceration.  And left hip tenderness to palpation.  No other injuries noted on exam.  CT head negative.  CT cervical spine negative.  Plain film of the hip negative.  Forehead laceration was cleaned.  Will allow for secondary closure.  On exam noted 3 staples from most recent laceration repair 1 month ago.  Staples were removed.  The patient appears reasonably screened and/or stabilized for discharge and I doubt any other medical condition or other [facility] requiring further screening, evaluation, or treatment in the ED at this time prior to discharge.  The patient is safe for discharge with strict return precautions.  Final Clinical Impression(s) / ED Diagnoses Final diagnoses:  Fall, initial encounter  Laceration of forehead, initial encounter  Left hip pain    Disposition: Discharge  Condition: Good   ED Discharge Orders    None       Follow Up: Facility provider  Schedule an appointment as soon as possible for a visit  As needed     This chart was dictated using voice recognition software.  Despite best efforts to proofread,  errors can occur which can change the documentation meaning.   Nira Conn, MD 07/13/18 [phone]

## 2018-07-13 NOTE — ED Notes (Signed)
Opened chart to answer question [facility]

## 2018-07-15 ENCOUNTER — Emergency Department (HOSPITAL_COMMUNITY): Payer: Medicare Other

## 2018-07-15 ENCOUNTER — Other Ambulatory Visit: Payer: Self-pay

## 2018-07-15 ENCOUNTER — Observation Stay (HOSPITAL_COMMUNITY)
Admission: EM | Admit: 2018-07-15 | Discharge: 2018-07-19 | Payer: Medicare Other | Attending: Internal Medicine | Admitting: Internal Medicine

## 2018-07-15 ENCOUNTER — Encounter (HOSPITAL_COMMUNITY): Payer: Self-pay

## 2018-07-15 DIAGNOSIS — Z888 Allergy status to other drugs, medicaments and biological substances status: Secondary | ICD-10-CM | POA: Diagnosis not present

## 2018-07-15 DIAGNOSIS — Z90710 Acquired absence of both cervix and uterus: Secondary | ICD-10-CM | POA: Diagnosis not present

## 2018-07-15 DIAGNOSIS — R410 Disorientation, unspecified: Secondary | ICD-10-CM | POA: Insufficient documentation

## 2018-07-15 DIAGNOSIS — Z515 Encounter for palliative care: Secondary | ICD-10-CM

## 2018-07-15 DIAGNOSIS — S0003XA Contusion of scalp, initial encounter: Secondary | ICD-10-CM | POA: Insufficient documentation

## 2018-07-15 DIAGNOSIS — Z7189 Other specified counseling: Secondary | ICD-10-CM

## 2018-07-15 DIAGNOSIS — I5030 Unspecified diastolic (congestive) heart failure: Secondary | ICD-10-CM | POA: Insufficient documentation

## 2018-07-15 DIAGNOSIS — Z885 Allergy status to narcotic agent status: Secondary | ICD-10-CM | POA: Insufficient documentation

## 2018-07-15 DIAGNOSIS — M1990 Unspecified osteoarthritis, unspecified site: Secondary | ICD-10-CM | POA: Diagnosis not present

## 2018-07-15 DIAGNOSIS — I130 Hypertensive heart and chronic kidney disease with heart failure and stage 1 through stage 4 chronic kidney disease, or unspecified chronic kidney disease: Secondary | ICD-10-CM | POA: Insufficient documentation

## 2018-07-15 DIAGNOSIS — B952 Enterococcus as the cause of diseases classified elsewhere: Secondary | ICD-10-CM | POA: Diagnosis not present

## 2018-07-15 DIAGNOSIS — D509 Iron deficiency anemia, unspecified: Secondary | ICD-10-CM | POA: Insufficient documentation

## 2018-07-15 DIAGNOSIS — Z79899 Other long term (current) drug therapy: Secondary | ICD-10-CM | POA: Insufficient documentation

## 2018-07-15 DIAGNOSIS — E440 Moderate protein-calorie malnutrition: Secondary | ICD-10-CM | POA: Diagnosis present

## 2018-07-15 DIAGNOSIS — Z8673 Personal history of transient ischemic attack (TIA), and cerebral infarction without residual deficits: Secondary | ICD-10-CM | POA: Insufficient documentation

## 2018-07-15 DIAGNOSIS — I4821 Permanent atrial fibrillation: Secondary | ICD-10-CM | POA: Insufficient documentation

## 2018-07-15 DIAGNOSIS — Z993 Dependence on wheelchair: Secondary | ICD-10-CM | POA: Diagnosis not present

## 2018-07-15 DIAGNOSIS — F0390 Unspecified dementia without behavioral disturbance: Secondary | ICD-10-CM | POA: Insufficient documentation

## 2018-07-15 DIAGNOSIS — Z950 Presence of cardiac pacemaker: Secondary | ICD-10-CM | POA: Diagnosis not present

## 2018-07-15 DIAGNOSIS — R296 Repeated falls: Secondary | ICD-10-CM | POA: Diagnosis not present

## 2018-07-15 DIAGNOSIS — S7002XA Contusion of left hip, initial encounter: Secondary | ICD-10-CM | POA: Diagnosis not present

## 2018-07-15 DIAGNOSIS — I4891 Unspecified atrial fibrillation: Secondary | ICD-10-CM | POA: Diagnosis present

## 2018-07-15 DIAGNOSIS — Z886 Allergy status to analgesic agent status: Secondary | ICD-10-CM | POA: Insufficient documentation

## 2018-07-15 DIAGNOSIS — I480 Paroxysmal atrial fibrillation: Secondary | ICD-10-CM

## 2018-07-15 DIAGNOSIS — R4182 Altered mental status, unspecified: Secondary | ICD-10-CM

## 2018-07-15 DIAGNOSIS — W06XXXA Fall from bed, initial encounter: Secondary | ICD-10-CM | POA: Insufficient documentation

## 2018-07-15 DIAGNOSIS — E039 Hypothyroidism, unspecified: Secondary | ICD-10-CM | POA: Diagnosis present

## 2018-07-15 DIAGNOSIS — S12300A Unspecified displaced fracture of fourth cervical vertebra, initial encounter for closed fracture: Secondary | ICD-10-CM | POA: Diagnosis not present

## 2018-07-15 DIAGNOSIS — N390 Urinary tract infection, site not specified: Principal | ICD-10-CM | POA: Diagnosis present

## 2018-07-15 DIAGNOSIS — N3000 Acute cystitis without hematuria: Secondary | ICD-10-CM

## 2018-07-15 DIAGNOSIS — Z1623 Resistance to quinolones and fluoroquinolones: Secondary | ICD-10-CM | POA: Diagnosis not present

## 2018-07-15 DIAGNOSIS — N184 Chronic kidney disease, stage 4 (severe): Secondary | ICD-10-CM | POA: Diagnosis not present

## 2018-07-15 DIAGNOSIS — G9341 Metabolic encephalopathy: Secondary | ICD-10-CM | POA: Diagnosis not present

## 2018-07-15 DIAGNOSIS — D631 Anemia in chronic kidney disease: Secondary | ICD-10-CM | POA: Diagnosis not present

## 2018-07-15 DIAGNOSIS — Z91013 Allergy to seafood: Secondary | ICD-10-CM | POA: Diagnosis not present

## 2018-07-15 DIAGNOSIS — I10 Essential (primary) hypertension: Secondary | ICD-10-CM | POA: Diagnosis present

## 2018-07-15 LAB — URINALYSIS, ROUTINE W REFLEX MICROSCOPIC
Bilirubin Urine: NEGATIVE
Glucose, UA: NEGATIVE mg/dL
Hgb urine dipstick: NEGATIVE
Ketones, ur: NEGATIVE mg/dL
Nitrite: NEGATIVE
Protein, ur: NEGATIVE mg/dL
Specific Gravity, Urine: 1.02 (ref 1.005–1.030)
pH: 5 (ref 5.0–8.0)

## 2018-07-15 LAB — CBC
HEMATOCRIT: 27.2 % — AB (ref 36.0–46.0)
Hemoglobin: 8.3 g/dL — ABNORMAL LOW (ref 12.0–15.0)
MCH: 31.2 pg (ref 26.0–34.0)
MCHC: 30.5 g/dL (ref 30.0–36.0)
MCV: 102.3 fL — AB (ref 80.0–100.0)
NRBC: 0.6 % — AB (ref 0.0–0.2)
Platelets: 162 10*3/uL (ref 150–400)
RBC: 2.66 MIL/uL — ABNORMAL LOW (ref 3.87–5.11)
RDW: 14 % (ref 11.5–15.5)
WBC: 8.3 10*3/uL (ref 4.0–10.5)

## 2018-07-15 LAB — BASIC METABOLIC PANEL
Anion gap: 7 (ref 5–15)
BUN: 26 mg/dL — ABNORMAL HIGH (ref 8–23)
CO2: 21 mmol/L — ABNORMAL LOW (ref 22–32)
Calcium: 8.4 mg/dL — ABNORMAL LOW (ref 8.9–10.3)
Chloride: 114 mmol/L — ABNORMAL HIGH (ref 98–111)
Creatinine, Ser: 1.23 mg/dL — ABNORMAL HIGH (ref 0.44–1.00)
GFR calc Af Amer: 43 mL/min — ABNORMAL LOW (ref >60)
GFR calc non Af Amer: 37 mL/min — ABNORMAL LOW (ref >60)
Glucose, Bld: 97 mg/dL (ref 70–99)
Potassium: 5.3 mmol/L — ABNORMAL HIGH (ref 3.5–5.1)
Sodium: 142 mmol/L (ref 135–145)

## 2018-07-15 LAB — CBC WITH DIFFERENTIAL/PLATELET
Abs Immature Granulocytes: 0.03 10*3/uL (ref 0.00–0.07)
Basophils Absolute: 0.1 10*3/uL (ref 0.0–0.1)
Basophils Relative: 1 %
Eosinophils Absolute: 0.1 10*3/uL (ref 0.0–0.5)
Eosinophils Relative: 1 %
HCT: 27.2 % — ABNORMAL LOW (ref 36.0–46.0)
Hemoglobin: 8 g/dL — ABNORMAL LOW (ref 12.0–15.0)
Immature Granulocytes: 0 %
Lymphocytes Relative: 16 %
Lymphs Abs: 1.5 10*3/uL (ref 0.7–4.0)
MCH: 31.5 pg (ref 26.0–34.0)
MCHC: 29.4 g/dL — ABNORMAL LOW (ref 30.0–36.0)
MCV: 107.1 fL — ABNORMAL HIGH (ref 80.0–100.0)
Monocytes Absolute: 1 10*3/uL (ref 0.1–1.0)
Monocytes Relative: 11 %
Neutro Abs: 6.4 10*3/uL (ref 1.7–7.7)
Neutrophils Relative %: 71 %
Platelets: 176 10*3/uL (ref 150–400)
RBC: 2.54 MIL/uL — ABNORMAL LOW (ref 3.87–5.11)
RDW: 14.2 % (ref 11.5–15.5)
WBC: 9 10*3/uL (ref 4.0–10.5)
nRBC: 0 % (ref 0.0–0.2)

## 2018-07-15 LAB — VITAMIN B12: Vitamin B-12: 463 pg/mL (ref 180–914)

## 2018-07-15 LAB — I-STAT CHEM 8, ED
BUN: 25 mg/dL — ABNORMAL HIGH (ref 8–23)
Calcium, Ion: 1.08 mmol/L — ABNORMAL LOW (ref 1.15–1.40)
Chloride: 113 mmol/L — ABNORMAL HIGH (ref 98–111)
Creatinine, Ser: 1.2 mg/dL — ABNORMAL HIGH (ref 0.44–1.00)
Glucose, Bld: 91 mg/dL (ref 70–99)
HCT: 24 % — ABNORMAL LOW (ref 36.0–46.0)
Hemoglobin: 8.2 g/dL — ABNORMAL LOW (ref 12.0–15.0)
Potassium: 5 mmol/L (ref 3.5–5.1)
Sodium: 142 mmol/L (ref 135–145)
TCO2: 24 mmol/L (ref 22–32)

## 2018-07-15 LAB — AMMONIA: AMMONIA: 16 umol/L (ref 9–35)

## 2018-07-15 LAB — CREATININE, SERUM
Creatinine, Ser: 1.12 mg/dL — ABNORMAL HIGH (ref 0.44–1.00)
GFR calc non Af Amer: 41 mL/min — ABNORMAL LOW (ref >60)
GFR, EST AFRICAN AMERICAN: 48 mL/min — AB (ref >60)

## 2018-07-15 LAB — FOLATE: FOLATE: 19.4 ng/mL (ref >5.9)

## 2018-07-15 LAB — TSH: TSH: 3.641 u[IU]/mL (ref 0.350–4.500)

## 2018-07-15 MED ORDER — HEPARIN SODIUM (PORCINE) 5000 UNIT/ML IJ SOLN
5000.0000 [IU] | Freq: Three times a day (TID) | INTRAMUSCULAR | Status: DC
  Administered 2018-07-16 – 2018-07-19 (×8): 5000 [IU] via SUBCUTANEOUS
  Filled 2018-07-15 (×9): qty 1

## 2018-07-15 MED ORDER — LORAZEPAM 2 MG/ML IJ SOLN
1.0000 mg | Freq: Once | INTRAMUSCULAR | Status: AC
  Administered 2018-07-15: 1 mg via INTRAVENOUS
  Filled 2018-07-15: qty 1

## 2018-07-15 MED ORDER — ONDANSETRON HCL 4 MG PO TABS: 4.0000 mg | ORAL_TABLET | Freq: Four times a day (QID) | ORAL | Status: DC | PRN

## 2018-07-15 MED ORDER — DULOXETINE HCL 30 MG PO CPEP
30.0000 mg | ORAL_CAPSULE | Freq: Every day | ORAL | Status: DC
  Administered 2018-07-16 – 2018-07-19 (×3): 30 mg via ORAL
  Filled 2018-07-15 (×4): qty 1

## 2018-07-15 MED ORDER — MEMANTINE HCL 5 MG PO TABS
5.0000 mg | ORAL_TABLET | Freq: Every day | ORAL | Status: DC
  Administered 2018-07-16 – 2018-07-19 (×3): 5 mg via ORAL
  Filled 2018-07-15 (×4): qty 1

## 2018-07-15 MED ORDER — LEVOTHYROXINE SODIUM 88 MCG PO TABS
88.0000 ug | ORAL_TABLET | Freq: Every day | ORAL | Status: DC
  Administered 2018-07-16 – 2018-07-19 (×4): 88 ug via ORAL
  Filled 2018-07-15 (×4): qty 1

## 2018-07-15 MED ORDER — ACETAMINOPHEN 325 MG PO TABS
650.0000 mg | ORAL_TABLET | Freq: Four times a day (QID) | ORAL | Status: DC | PRN
  Administered 2018-07-16: 650 mg via ORAL
  Filled 2018-07-15: qty 2

## 2018-07-15 MED ORDER — ZIPRASIDONE MESYLATE 20 MG IM SOLR
10.0000 mg | Freq: Once | INTRAMUSCULAR | Status: DC
  Filled 2018-07-15: qty 20

## 2018-07-15 MED ORDER — LORAZEPAM 2 MG/ML IJ SOLN
0.5000 mg | Freq: Three times a day (TID) | INTRAMUSCULAR | Status: DC | PRN
  Administered 2018-07-16: 0.5 mg via INTRAVENOUS
  Filled 2018-07-15: qty 1

## 2018-07-15 MED ORDER — ONDANSETRON HCL 4 MG/2ML IJ SOLN: 4.0000 mg | Freq: Four times a day (QID) | INTRAMUSCULAR | Status: DC | PRN

## 2018-07-15 MED ORDER — SODIUM CHLORIDE 0.9 % IV SOLN
1.0000 g | INTRAVENOUS | Status: DC
  Administered 2018-07-15 – 2018-07-16 (×2): 1 g via INTRAVENOUS
  Filled 2018-07-15 (×3): qty 10

## 2018-07-15 MED ORDER — SODIUM CHLORIDE 0.9 % IV SOLN
INTRAVENOUS | Status: DC
  Administered 2018-07-16 – 2018-07-18 (×5): via INTRAVENOUS

## 2018-07-15 MED ORDER — LORAZEPAM 2 MG/ML IJ SOLN: 0.5000 mg | Freq: Four times a day (QID) | INTRAMUSCULAR | Status: DC | PRN

## 2018-07-15 MED ORDER — DILTIAZEM HCL ER 90 MG PO CP12
180.0000 mg | ORAL_CAPSULE | Freq: Every day | ORAL | Status: DC
  Administered 2018-07-16 – 2018-07-18 (×2): 180 mg via ORAL
  Filled 2018-07-15 (×4): qty 2

## 2018-07-15 MED ORDER — SODIUM CHLORIDE 0.9 % IV BOLUS
500.0000 mL | Freq: Once | INTRAVENOUS | Status: AC
  Administered 2018-07-15: 500 mL via INTRAVENOUS

## 2018-07-15 MED ORDER — STERILE WATER FOR INJECTION IJ SOLN
INTRAMUSCULAR | Status: AC
  Administered 2018-07-15: 1.2 mL
  Filled 2018-07-15: qty 10

## 2018-07-15 NOTE — H&P (Signed)
History and Physical        Hospital Admission Note Date: 07/15/2018  Patient name: Jennifer Bender Medical record number: [id number] Date of birth: 1924-03-28 Age: 84 y.o. Gender: female  PCP: Default, Provider, MD    Patient coming from:   I have reviewed all records in the [facility].    Chief Complaint:  Altered mental status  HPI: Patient is a 84 year old female with history of dementia, atrial fibrillation, not on anticoagulation, CKD stage IV, diastolic CHF, hypertension, frequent falls presented from skilled nursing facility for the evaluation of altered mental status for the last 3 days.  Patient was evaluated at [facility], ED on 11/22 when she had presented with a fall out of bed.  At baseline, patient is wheelchair-bound.  All the imaging was negative and patient was sent back to the facility, apparently was completely alert and oriented at her baseline.  Patient then subsequently became more confused and progressively less responsive.  At the time of my encounter, she is agitated, rambling and hallucinating.   ED work-up/course:  In ED, temp 99 F, respiratory rate 20, heart rate 76, BP 152/96, O2 sats 100% on room air EKG showed rate 90, atrial fibrillation, QTC 476  Hip x-rays negative for any fracture dislocation UA positive for UTI, large leukocytes, WBCs 21-50, bacteria rare, cultures pending  Review of Systems: Positives marked in 'bold' Unable to obtain from the patient due to her mental status  Past Medical History: Past Medical History:  Diagnosis Date  . Arthritis   . Atrial fibrillation (HCC)   . CHF (congestive heart failure) (HCC)   . Hypertension   . Pneumonia   . UTI (lower urinary tract infection)     Past Surgical History:  Procedure Laterality Date  . ABDOMINAL HYSTERECTOMY    . BREAST REDUCTION SURGERY    . CARDIOVERSION      . HAND SURGERY      Medications: Prior to Admission medications   Medication Sig Start Date End Date Taking? Authorizing Provider  acetaminophen (TYLENOL) 325 MG tablet Take 650 mg by mouth 4 (four) times daily as needed for mild pain.    Yes [provider]  diltiazem (DILACOR XR) 180 MG 24 hr capsule Take 180 mg by mouth daily.   Yes [provider]  DULoxetine (CYMBALTA) 30 MG capsule Take 30 mg by mouth daily.   Yes [provider]  fish oil-omega-3 fatty acids 1000 MG capsule Take 1 g by mouth daily.    Yes [provider]  levothyroxine (SYNTHROID, LEVOTHROID) 88 MCG tablet Take 88 mcg by mouth daily before breakfast.   Yes [provider]  Melatonin 5 MG TABS Take 5 mg by mouth at bedtime as needed (sleep).   Yes [provider]  memantine (NAMENDA) 5 MG tablet Take 5 mg by mouth daily.   Yes [provider]  Menthol, Topical Analgesic, (BLUE GEL) 2 % GEL Apply 1 application topically 3 (three) times daily.   Yes [provider]  Multiple Vitamin (DAILY VITE PO) Take 1 tablet by mouth daily.   Yes [provider]  polyethylene glycol (MIRALAX / GLYCOLAX) packet Take  17 g by mouth daily. 06/17/18  Yes Zigmund Daniel., MD  potassium chloride (K-DUR,KLOR-CON) 10 MEQ tablet Take 10 mEq by mouth daily.   Yes [provider]  feeding supplement, ENSURE ENLIVE, (ENSURE ENLIVE) LIQD Take 237 mLs by mouth 2 (two) times daily between meals. Patient not taking: Reported on 07/13/2018 06/17/18   Zigmund Daniel., MD    Allergies:   Allergies  Allergen Reactions  . Ace Inhibitors     Angioedema   . Colchicine     unknown  . Crestor [Rosuvastatin Calcium]     Myalgias   . Ezetimibe Other (See Comments)  . Hydrocodone-Acetaminophen Nausea Only  . Lipitor [Atorvastatin Calcium]     Myalgias   . Morphine And Related Other (See Comments)    Unknown  . Nsaids Nausea And Vomiting     Renal insufficiency  Renal sufficient  . Pravachol Nausea Only  . Rosuvastatin Other (See Comments)    Unknown  . Shellfish Allergy Other (See Comments)  . Tolmetin Other (See Comments)    Renal insufficiency   . Tramadol Nausea Only  . Zocor [Simvastatin]     Myalgias     Social History: Per records, reports that she has never smoked. She has never used smokeless tobacco. She reports that she does not drink alcohol or use drugs.  Family History: Unable to obtain from the patient due to her mental status  Physical Exam: Blood pressure (!) 169/76, pulse 81, temperature 99 F (37.2 C), temperature source Axillary, resp. rate 20, height 5\' 6"  (1.676 m), weight 55 kg, SpO2 96 %. General: Alert and awake, confused, agitated, rambling, hallucinating Eyes: Unable to assess, patient refused to be examined closely HEENT: normocephalic, ecchymosis and small hematomas on the left side of the face, scalp Neck: No JVD CVS: + pacemaker, irregular rhythm  Resp : CTA B anteriorly, limited exam GI : Unable to assess, patient did not let me examine her abdomen Musculoskeletal: No peripheral edema, diffuse areas of ecchymosis on the left hip, left lower leg with abrasion Neuro: Does not follow any commands, rambling, agitated, moving all 4 extremities Psych: Agitated, rambling and confused Skin: Multiple areas of ecchymosis, on the left side of the face, forehead, left hip, skin tear on the left lower leg   LABS on Admission: I have personally reviewed all the labs and imagings below    Basic Metabolic Panel: Recent Labs  Lab 07/13/18 0241 07/15/18 1501 07/15/18 1507  NA 142 142 142  K 4.4 5.3* 5.0  CL 111 114* 113*  CO2 26 21*  --   GLUCOSE 96 97 91  BUN 28* 26* 25*  CREATININE 0.99 1.23* 1.20*  CALCIUM 8.6* 8.4*  --    Liver Function Tests: No results for input(s): AST, ALT, ALKPHOS, BILITOT, PROT, ALBUMIN in the last 168 hours. No results for input(s): LIPASE, AMYLASE in the  last 168 hours. No results for input(s): AMMONIA in the last 168 hours. CBC: Recent Labs  Lab 07/13/18 0241 07/15/18 1501 07/15/18 1507  WBC 8.0 9.0  --   NEUTROABS 5.3 6.4  --   HGB 9.9* 8.0* 8.2*  HCT 32.7* 27.2* 24.0*  MCV 102.8* 107.1*  --   PLT 219 176  --    Cardiac Enzymes: No results for input(s): CKTOTAL, CKMB, CKMBINDEX, TROPONINI in the last 168 hours. BNP: Invalid input(s): POCBNP CBG: No results for input(s): GLUCAP in the last 168 hours.  Radiological Exams on Admission:  Ct Head  Wo Contrast  Result Date: 07/15/2018 CLINICAL DATA:  84 year old female with falls over the past 3 weeks. Decreased responsiveness. History of dementia. Cervical spine fracture. Patient removes C-collar. Subsequent encounter. EXAM: CT HEAD WITHOUT CONTRAST CT CERVICAL SPINE WITHOUT CONTRAST TECHNIQUE: Multidetector CT imaging of the head and cervical spine was performed following the standard protocol without intravenous contrast. Multiplanar CT image reconstructions of the cervical spine were also generated. COMPARISON:  Several prior exams including 06/12/2018, 06/06/2018, 06/13/2018, 06/12/2018, 05/28/2018 and 02/20/2018 CTs. FINDINGS: CT HEAD FINDINGS Brain: No intracranial hemorrhage or CT evidence of large acute infarct. Chronic microvascular changes. Global atrophy. No intracranial mass lesion noted on this unenhanced exam. Vascular: Vascular calcifications.  No acute hyperdense vessel. Skull: No skull fracture. Sinuses/Orbits: No acute orbital abnormality. Opacification right frontal sinus, right ethmoid sinus air cells and right maxillary sinus. Mild mucosal thickening left sphenoid sinus with adjacent bone thickening consistent with changes of chronic sinusitis. Mastoid air cells and middle ear cavities are clear. Other: Small left frontal scalp hematoma once again noted. CT CERVICAL SPINE FINDINGS Alignment/vertebra: Rotation of the head. Fracture of the C4 posterior elements (spinous  process/lamina junction) and fracture of the anterior inferior aspect of the C4 vertebral body without significant change. Widening of the C4-5 disc interspace greatest anteriorly and minimal anterior slip C4 has been noted since 02/20/2018. It is therefore possible that widening of the C4-5 disc space may be unrelated to recent trauma. Patient has a pacemaker in place limiting ability to get MR to exclude disc/ligamentous injury. Remote C5 superior endplate compression fracture greater on the right. Remote T1 mild superior endplate compression fracture. Remote anterior wedge compression fracture T2 (involving superior and inferior endplate) with slight posterior bulging of posterior cortex. Findings are similar to 02/20/2018. Overall, no new fracture noted. Soft tissues and spinal canal: No abnormal prevertebral soft tissue swelling. Disc levels: Multilevel degenerative changes without high-grade spinal stenosis. Upper chest: No worrisome apical mass. Other: Carotid bifurcation calcifications. IMPRESSION: CT head: 1. Small left frontal scalp hematoma once again noted. No underlying skull fracture or intracranial hemorrhage. 2. Chronic microvascular changes and atrophy. 3. Persistent opacification right frontal sinus, right ethmoid sinus air cells and right maxillary sinus with mild mucosal thickening left sphenoid sinus. CT CERVICAL SPINE: 1. Overall, no new cervical spine fracture noted. 2. Fracture of the C4 posterior elements (spinous process/lamina junction) and fracture of the anterior inferior aspect of the C4 vertebral body without significant change. 3. Widening of the C4-5 disc interspace greatest anteriorly and minimal anterior slip C4 has been noted since 02/20/2018. It is therefore possible that widening of the C4-5 disc space is unrelated to recent trauma. Patient has a pacemaker in place limiting ability to get MR to exclude disc/ligamentous injury. 4. Remote C5 superior endplate compression fracture  greater on the right. Remote T1 mild superior endplate compression fracture. Remote anterior wedge compression fracture T2 (involving superior and inferior endplate) with slight posterior bulging of posterior cortex. Findings are similar to 02/20/2018. 5. No abnormal prevertebral soft tissue swelling. 6. Multilevel degenerative changes without high-grade spinal stenosis. Electronically Signed   By: Lacy DuverneySteven  Olson M.D.   On: 07/15/2018 15:21   Ct Cervical Spine Wo Contrast  Result Date: 07/15/2018 CLINICAL DATA:  84 year old female with falls over the past 3 weeks. Decreased responsiveness. History of dementia. Cervical spine fracture. Patient removes C-collar. Subsequent encounter. EXAM: CT HEAD WITHOUT CONTRAST CT CERVICAL SPINE WITHOUT CONTRAST TECHNIQUE: Multidetector CT imaging of the head and cervical spine was performed following  the standard protocol without intravenous contrast. Multiplanar CT image reconstructions of the cervical spine were also generated. COMPARISON:  Several prior exams including 06/12/2018, 06/06/2018, 06/13/2018, 06/12/2018, 05/28/2018 and 02/20/2018 CTs. FINDINGS: CT HEAD FINDINGS Brain: No intracranial hemorrhage or CT evidence of large acute infarct. Chronic microvascular changes. Global atrophy. No intracranial mass lesion noted on this unenhanced exam. Vascular: Vascular calcifications.  No acute hyperdense vessel. Skull: No skull fracture. Sinuses/Orbits: No acute orbital abnormality. Opacification right frontal sinus, right ethmoid sinus air cells and right maxillary sinus. Mild mucosal thickening left sphenoid sinus with adjacent bone thickening consistent with changes of chronic sinusitis. Mastoid air cells and middle ear cavities are clear. Other: Small left frontal scalp hematoma once again noted. CT CERVICAL SPINE FINDINGS Alignment/vertebra: Rotation of the head. Fracture of the C4 posterior elements (spinous process/lamina junction) and fracture of the anterior  inferior aspect of the C4 vertebral body without significant change. Widening of the C4-5 disc interspace greatest anteriorly and minimal anterior slip C4 has been noted since 02/20/2018. It is therefore possible that widening of the C4-5 disc space may be unrelated to recent trauma. Patient has a pacemaker in place limiting ability to get MR to exclude disc/ligamentous injury. Remote C5 superior endplate compression fracture greater on the right. Remote T1 mild superior endplate compression fracture. Remote anterior wedge compression fracture T2 (involving superior and inferior endplate) with slight posterior bulging of posterior cortex. Findings are similar to 02/20/2018. Overall, no new fracture noted. Soft tissues and spinal canal: No abnormal prevertebral soft tissue swelling. Disc levels: Multilevel degenerative changes without high-grade spinal stenosis. Upper chest: No worrisome apical mass. Other: Carotid bifurcation calcifications. IMPRESSION: CT head: 1. Small left frontal scalp hematoma once again noted. No underlying skull fracture or intracranial hemorrhage. 2. Chronic microvascular changes and atrophy. 3. Persistent opacification right frontal sinus, right ethmoid sinus air cells and right maxillary sinus with mild mucosal thickening left sphenoid sinus. CT CERVICAL SPINE: 1. Overall, no new cervical spine fracture noted. 2. Fracture of the C4 posterior elements (spinous process/lamina junction) and fracture of the anterior inferior aspect of the C4 vertebral body without significant change. 3. Widening of the C4-5 disc interspace greatest anteriorly and minimal anterior slip C4 has been noted since 02/20/2018. It is therefore possible that widening of the C4-5 disc space is unrelated to recent trauma. Patient has a pacemaker in place limiting ability to get MR to exclude disc/ligamentous injury. 4. Remote C5 superior endplate compression fracture greater on the right. Remote T1 mild superior endplate  compression fracture. Remote anterior wedge compression fracture T2 (involving superior and inferior endplate) with slight posterior bulging of posterior cortex. Findings are similar to 02/20/2018. 5. No abnormal prevertebral soft tissue swelling. 6. Multilevel degenerative changes without high-grade spinal stenosis. Electronically Signed   By: Lacy Duverney M.D.   On: 07/15/2018 15:21   Dg Chest Portable 1 View  Result Date: 07/15/2018 CLINICAL DATA:  Altered mental status, hypertension. EXAM: PORTABLE CHEST 1 VIEW COMPARISON:  Chest x-rays dated 06/14/2018, 05/30/2018 and 02/20/2018. FINDINGS: Stable cardiomegaly. Mild interstitial prominence, RIGHT greater than LEFT, suggesting mild interstitial edema. No pleural effusion or pneumothorax seen. LEFT chest wall pacemaker/ICD leads appear stable in position. Again noted is the displaced fracture of the RIGHT humeral neck. Osseous structures about the chest are otherwise unremarkable. IMPRESSION: 1. Cardiomegaly. Mild interstitial prominence, most likely interstitial edema, suspicious for mild CHF. 2. Chronic appearing fracture at the RIGHT humeral neck. Electronically Signed   By: Anne Ng.D.  On: 07/15/2018 13:56   Dg Hip Port Unilat W Or Wo Pelvis 1 View Left  Result Date: 07/15/2018 CLINICAL DATA:  Fall, LEFT hip bruising, pain. EXAM: DG HIP (WITH OR WITHOUT PELVIS) 1V PORT LEFT COMPARISON:  Plain films of the pelvis and LEFT hip dated 07/13/2018, 04/24/2017 and 12/04/2015. FINDINGS: Osseous alignment is stable. No fracture line or displaced fracture fragment appreciated. No significant degenerative change at either hip joint. Prominent vascular calcifications throughout the soft tissues of the pelvis and upper thighs. IMPRESSION: No acute findings. No osseous fracture or dislocation. Electronically Signed   By: Bary Richard M.D.   On: 07/15/2018 13:58      EKG: Independently reviewed.  Rate 90, atrial fibrillation, no acute ST T wave  changes   Assessment/Plan Principal Problem:   Acute metabolic encephalopathy in the setting of dementia and worsened likely due to UTI, rule out any stroke -Placed on gentle hydration, patient needs IV access, EDP will give Geodon as patient is very agitated and hallucinating and place IV access -Obtain urine culture and sensitivities, placed on IV Rocephin -Obtain vitamin B12, folate, B1, ammonia level -CT head showed small left frontal scalp hematoma however no skull fracture or intracranial hemorrhage.  Once patient is calm and cooperative, will obtain MRI of the brain to rule out any CVA. -Trazodone was discontinued during the previous admission.  Patient had syncope work-up done in the previous admission which was negative. -Avoid sedatives and narcotics  Active Problems: Acute lower UTI -Obtain urine culture and sensitivities, placed on IV Rocephin  Nondisplaced C4 cervical fracture (HCC) -CT C-spine showed fracture of C4 posterior elements and fracture of anterior inferior aspect of C4 vertebral body without significant change. -Patient was seen by neurosurgery during the previous admission and had felt that due to patient's age and comorbidities she would not be a good candidate for surgery, head recommended Aspen collar -Neurosurgery was consulted by EDP, Dr. Maurice Small, recommended no neurosurgical interventions at this time.    Atrial fibrillation (HCC) with a pacemaker Patient had 2D echo recently done with normal EF, pacemaker was interrogated in 05/2018 Not an anticoagulation candidate secondary to falls and advanced dementia, delirium    CKD (chronic kidney disease) stage 4, GFR 15-29 ml/min (HCC) -Creatinine currently at baseline     HTN (hypertension) -Continue Cardizem     Hypothyroidism -Obtain TSH, continue Synthroid    Malnutrition of moderate degree -Dietitian consult  Macrocytic anemia Obtain B12, folate, follow H&H closely   DVT prophylaxis: Heparin  subcu  CODE STATUS: DNR/DNI.  Patient had been seen by palliative care during the previous admission and had recommended DNR/DNI after goals of care  Consults called: None  Family Communication: No family member present at the bedside  Admission status: Telemetry, observation  Disposition plan: Further plan will depend as patient's clinical course evolves and further radiologic and laboratory data become available.    At the time of admission, it appears that the appropriate admission status for this patient is observation. This is judged to be reasonable and necessary in order to provide the required intensity of service to ensure the patient's safety given the presenting symptoms delirium, hallucinating, UTI, physical exam findings, and initial radiographic and laboratory data in the context of their chronic comorbidities.  The medical decision making on this patient was of high complexity and the patient is at high risk for clinical deterioration, therefore this is a level 3 visit.   Time Spent on Admission: 60 minutes  Thad Ranger M.D. [facility] 07/15/2018, 5:06 PM Pager: [phone]  If 7PM-7AM, please contact night-coverage [email or web address] Password [email or web address]

## 2018-07-15 NOTE — ED Notes (Signed)
Pt tries to hit and kick staff while trying to clean patient. Pty speaking garled words.

## 2018-07-15 NOTE — ED Notes (Signed)
Pt cleaned of small amount of stool and moderate amount of incontinent urine.

## 2018-07-15 NOTE — ED Notes (Signed)
Admitting provider left bedside with a recommendation for sedation so that IV could be placed.

## 2018-07-15 NOTE — ED Notes (Signed)
Pt has strong movment with attempts to pinch and hit with right and left hand. Follows no commands.

## 2018-07-15 NOTE — ED Notes (Addendum)
Pt pulls iv out with bleeding at left forearm.Hematoma at left forearm. Pt mcleaned and dressed but removed all dressing and monitors.

## 2018-07-15 NOTE — ED Provider Notes (Signed)
[facility] EMERGENCY [facility] Provider Note   CSN: [id number] Arrival date & time:        History   Chief Complaint Chief Complaint  Patient presents with  . Altered Mental Status   Level 5 caveat due to altered mental status HPI Jennifer Bender is a 84 y.o. female with history of A. fib, dementia, CKD, CHF, hypertension presents [facility] for evaluation of acute onset, progressively worsening altered mental status for 3 days.  She was [facility] and [facility] on 07/12/2018 after falling out of bed.  She has a history of frequent falls and is wheelchair-bound at baseline.  Imaging was reassuring at the time and she was sent back to her facility and was A&O x3 at the time.  Per EMS, the patient has become progressively less responsive since then.  She currently withdraws from pain, yells out to painful stimuli but otherwise is nonverbal.  She is a full code.  The history is provided by the patient.    Past Medical History:  Diagnosis Date  . Arthritis   . Atrial fibrillation (HCC)   . CHF (congestive heart failure) (HCC)   . Hypertension   . Pneumonia   . UTI (lower urinary tract infection)     Patient Active Problem List   Diagnosis Date Noted  . Acute metabolic encephalopathy 07/15/2018  . Acute lower UTI 07/15/2018  . Atrial fibrillation (HCC) 06/14/2018  . CKD (chronic kidney disease) stage 4, GFR 15-29 ml/min (HCC) 06/14/2018  . CHF (congestive heart failure) (HCC) 06/14/2018  . HTN (hypertension) 06/14/2018  . C4 cervical fracture (HCC) 06/14/2018  . Hypothyroidism 06/14/2018  . Malnutrition of moderate degree 06/14/2018  . Syncope 06/13/2018    Past Surgical History:  Procedure Laterality Date  . ABDOMINAL HYSTERECTOMY    . BREAST REDUCTION SURGERY    . CARDIOVERSION    . HAND SURGERY       OB History   None      Home Medications    Prior to Admission medications   Medication  Sig Start Date End Date Taking? Authorizing Provider  acetaminophen (TYLENOL) 325 MG tablet Take 650 mg by mouth 4 (four) times daily as needed for mild pain.    Yes [provider]  diltiazem (DILACOR XR) 180 MG 24 hr capsule Take 180 mg by mouth daily.   Yes [provider]  DULoxetine (CYMBALTA) 30 MG capsule Take 30 mg by mouth daily.   Yes [provider]  fish oil-omega-3 fatty acids 1000 MG capsule Take 1 g by mouth daily.    Yes [provider]  levothyroxine (SYNTHROID, LEVOTHROID) 88 MCG tablet Take 88 mcg by mouth daily before breakfast.   Yes [provider]  Melatonin 5 MG TABS Take 5 mg by mouth at bedtime as needed (sleep).   Yes [provider]  memantine (NAMENDA) 5 MG tablet Take 5 mg by mouth daily.   Yes [provider]  Menthol, Topical Analgesic, (BLUE GEL) 2 % GEL Apply 1 application topically 3 (three) times daily.   Yes [provider]  Multiple Vitamin (DAILY VITE PO) Take 1 tablet by mouth daily.   Yes [provider]  polyethylene glycol (MIRALAX / GLYCOLAX) packet Take 17 g by mouth daily. 06/17/18  Yes Zigmund DanielPowell, A Caldwell Jr., MD  potassium chloride (K-DUR,KLOR-CON) 10 MEQ tablet Take 10 mEq by mouth daily.   Yes [provider]  feeding supplement,  ENSURE ENLIVE, (ENSURE ENLIVE) LIQD Take 237 mLs by mouth 2 (two) times daily between meals. Patient not taking: Reported on 07/13/2018 06/17/18   Zigmund Daniel., MD    Family History No family history on file.  Social History Social History   Tobacco Use  . Smoking status: Never Smoker  . Smokeless tobacco: Never Used  Substance Use Topics  . Alcohol use: No  . Drug use: No     Allergies   Ace inhibitors; Colchicine; Crestor [rosuvastatin calcium]; Ezetimibe; Hydrocodone-acetaminophen; Lipitor [atorvastatin calcium]; Morphine and related; Nsaids; Pravachol; Rosuvastatin; Shellfish allergy; Tolmetin; Tramadol; and  Zocor [simvastatin]   Review of Systems Review of Systems  Unable to perform ROS: Mental status change     Physical Exam Updated Vital Signs BP (!) 123/105   Pulse 92   Temp 99 F (37.2 C) (Axillary)   Resp (!) 43   Ht 5\' 6"  (1.676 m)   Wt 55 kg   SpO2 96%   BMI 19.57 kg/m   Physical Exam  Constitutional: She appears well-developed and well-nourished. No distress.  HENT:  Head: Normocephalic.  Ecchymosis and hematomas noted to the left side of the face and scalp.  No battle signs, no hemotympanum, no rhinorrhea  Eyes: Conjunctivae are normal. Right eye exhibits no discharge. Left eye exhibits no discharge.  Irregularly-shaped left pupil (present was a sideways teardrop), pupils reactive to light bilaterally  Neck: No JVD present. No tracheal deviation present.  Cardiovascular: Normal rate.  Irregularly irregular rhythm, 2+ radial pulses bilaterally, 2+ right DP/PT pulses, 1+ left DP/PT pulses.  No lower extremity edema.  Pulmonary/Chest: Effort normal.  Abdominal: She exhibits no distension.  Musculoskeletal: She exhibits no edema.  Neurological: GCS eye subscore is 1. GCS verbal subscore is 2. GCS motor subscore is 4.  Does not follow commands.  Occasional minimal movement of extremities  Skin: Skin is warm and dry. No erythema.  Diffuse areas of ecchymosis in various stages of healing, worst to the left side of the face and left hip.  Superficial abrasion to the left forehead and superficial skin tear to the left lower leg, no surrounding erythema or abnormal drainage.  Psychiatric: She has a normal mood and affect. Her behavior is normal.  Nursing note and vitals reviewed.    ED Treatments / Results  Labs (all labs ordered are listed, but only abnormal results are displayed) Labs Reviewed  CBC WITH DIFFERENTIAL/PLATELET - Abnormal; Notable for the following components:      Result Value   RBC 2.54 (*)    Hemoglobin 8.0 (*)    HCT 27.2 (*)    MCV 107.1 (*)     MCHC 29.4 (*)    All other components within normal limits  BASIC METABOLIC PANEL - Abnormal; Notable for the following components:   Potassium 5.3 (*)    Chloride 114 (*)    CO2 21 (*)    BUN 26 (*)    Creatinine, Ser 1.23 (*)    Calcium 8.4 (*)    GFR calc non Af Amer 37 (*)    GFR calc Af Amer 43 (*)    All other components within normal limits  URINALYSIS, ROUTINE W REFLEX MICROSCOPIC - Abnormal; Notable for the following components:   APPearance HAZY (*)    Leukocytes, UA LARGE (*)    Bacteria, UA RARE (*)    Non Squamous Epithelial 0-5 (*)    All other components within normal limits  I-STAT CHEM 8, ED -  Abnormal; Notable for the following components:   Chloride 113 (*)    BUN 25 (*)    Creatinine, Ser 1.20 (*)    Calcium, Ion 1.08 (*)    Hemoglobin 8.2 (*)    HCT 24.0 (*)    All other components within normal limits  URINE CULTURE  CBG MONITORING, ED    EKG EKG Interpretation  Date/Time:  Monday July 15 2018 13:25:28 EST Ventricular Rate:  90 PR Interval:    QRS Duration: 114 QT Interval:  389 QTC Calculation: 476 R Axis:   -41 Text Interpretation:  Atrial fibrillation Borderline IVCD with LAD Low voltage, extremity and precordial leads RSR' in V1 or V2, probably normal variant Borderline repolarization abnormality Abnormal ekg \ Confirmed by Gerhard Munch [phone]) on 07/15/2018 1:28:49 PM   Radiology Ct Head Wo Contrast  Result Date: 07/15/2018 CLINICAL DATA:  84 year old female with falls over the past 3 weeks. Decreased responsiveness. History of dementia. Cervical spine fracture. Patient removes C-collar. Subsequent encounter. EXAM: CT HEAD WITHOUT CONTRAST CT CERVICAL SPINE WITHOUT CONTRAST TECHNIQUE: Multidetector CT imaging of the head and cervical spine was performed following the standard protocol without intravenous contrast. Multiplanar CT image reconstructions of the cervical spine were also generated. COMPARISON:  Several prior exams including  06/12/2018, 06/06/2018, 06/13/2018, 06/12/2018, 05/28/2018 and 02/20/2018 CTs. FINDINGS: CT HEAD FINDINGS Brain: No intracranial hemorrhage or CT evidence of large acute infarct. Chronic microvascular changes. Global atrophy. No intracranial mass lesion noted on this unenhanced exam. Vascular: Vascular calcifications.  No acute hyperdense vessel. Skull: No skull fracture. Sinuses/Orbits: No acute orbital abnormality. Opacification right frontal sinus, right ethmoid sinus air cells and right maxillary sinus. Mild mucosal thickening left sphenoid sinus with adjacent bone thickening consistent with changes of chronic sinusitis. Mastoid air cells and middle ear cavities are clear. Other: Small left frontal scalp hematoma once again noted. CT CERVICAL SPINE FINDINGS Alignment/vertebra: Rotation of the head. Fracture of the C4 posterior elements (spinous process/lamina junction) and fracture of the anterior inferior aspect of the C4 vertebral body without significant change. Widening of the C4-5 disc interspace greatest anteriorly and minimal anterior slip C4 has been noted since 02/20/2018. It is therefore possible that widening of the C4-5 disc space may be unrelated to recent trauma. Patient has a pacemaker in place limiting ability to get MR to exclude disc/ligamentous injury. Remote C5 superior endplate compression fracture greater on the right. Remote T1 mild superior endplate compression fracture. Remote anterior wedge compression fracture T2 (involving superior and inferior endplate) with slight posterior bulging of posterior cortex. Findings are similar to 02/20/2018. Overall, no new fracture noted. Soft tissues and spinal canal: No abnormal prevertebral soft tissue swelling. Disc levels: Multilevel degenerative changes without high-grade spinal stenosis. Upper chest: No worrisome apical mass. Other: Carotid bifurcation calcifications. IMPRESSION: CT head: 1. Small left frontal scalp hematoma once again noted. No  underlying skull fracture or intracranial hemorrhage. 2. Chronic microvascular changes and atrophy. 3. Persistent opacification right frontal sinus, right ethmoid sinus air cells and right maxillary sinus with mild mucosal thickening left sphenoid sinus. CT CERVICAL SPINE: 1. Overall, no new cervical spine fracture noted. 2. Fracture of the C4 posterior elements (spinous process/lamina junction) and fracture of the anterior inferior aspect of the C4 vertebral body without significant change. 3. Widening of the C4-5 disc interspace greatest anteriorly and minimal anterior slip C4 has been noted since 02/20/2018. It is therefore possible that widening of the C4-5 disc space is unrelated to recent trauma. Patient has a  pacemaker in place limiting ability to get MR to exclude disc/ligamentous injury. 4. Remote C5 superior endplate compression fracture greater on the right. Remote T1 mild superior endplate compression fracture. Remote anterior wedge compression fracture T2 (involving superior and inferior endplate) with slight posterior bulging of posterior cortex. Findings are similar to 02/20/2018. 5. No abnormal prevertebral soft tissue swelling. 6. Multilevel degenerative changes without high-grade spinal stenosis. Electronically Signed   By: Lacy Duverney M.D.   On: 07/15/2018 15:21   Ct Cervical Spine Wo Contrast  Result Date: 07/15/2018 CLINICAL DATA:  84 year old female with falls over the past 3 weeks. Decreased responsiveness. History of dementia. Cervical spine fracture. Patient removes C-collar. Subsequent encounter. EXAM: CT HEAD WITHOUT CONTRAST CT CERVICAL SPINE WITHOUT CONTRAST TECHNIQUE: Multidetector CT imaging of the head and cervical spine was performed following the standard protocol without intravenous contrast. Multiplanar CT image reconstructions of the cervical spine were also generated. COMPARISON:  Several prior exams including 06/12/2018, 06/06/2018, 06/13/2018, 06/12/2018, 05/28/2018 and  02/20/2018 CTs. FINDINGS: CT HEAD FINDINGS Brain: No intracranial hemorrhage or CT evidence of large acute infarct. Chronic microvascular changes. Global atrophy. No intracranial mass lesion noted on this unenhanced exam. Vascular: Vascular calcifications.  No acute hyperdense vessel. Skull: No skull fracture. Sinuses/Orbits: No acute orbital abnormality. Opacification right frontal sinus, right ethmoid sinus air cells and right maxillary sinus. Mild mucosal thickening left sphenoid sinus with adjacent bone thickening consistent with changes of chronic sinusitis. Mastoid air cells and middle ear cavities are clear. Other: Small left frontal scalp hematoma once again noted. CT CERVICAL SPINE FINDINGS Alignment/vertebra: Rotation of the head. Fracture of the C4 posterior elements (spinous process/lamina junction) and fracture of the anterior inferior aspect of the C4 vertebral body without significant change. Widening of the C4-5 disc interspace greatest anteriorly and minimal anterior slip C4 has been noted since 02/20/2018. It is therefore possible that widening of the C4-5 disc space may be unrelated to recent trauma. Patient has a pacemaker in place limiting ability to get MR to exclude disc/ligamentous injury. Remote C5 superior endplate compression fracture greater on the right. Remote T1 mild superior endplate compression fracture. Remote anterior wedge compression fracture T2 (involving superior and inferior endplate) with slight posterior bulging of posterior cortex. Findings are similar to 02/20/2018. Overall, no new fracture noted. Soft tissues and spinal canal: No abnormal prevertebral soft tissue swelling. Disc levels: Multilevel degenerative changes without high-grade spinal stenosis. Upper chest: No worrisome apical mass. Other: Carotid bifurcation calcifications. IMPRESSION: CT head: 1. Small left frontal scalp hematoma once again noted. No underlying skull fracture or intracranial hemorrhage. 2.  Chronic microvascular changes and atrophy. 3. Persistent opacification right frontal sinus, right ethmoid sinus air cells and right maxillary sinus with mild mucosal thickening left sphenoid sinus. CT CERVICAL SPINE: 1. Overall, no new cervical spine fracture noted. 2. Fracture of the C4 posterior elements (spinous process/lamina junction) and fracture of the anterior inferior aspect of the C4 vertebral body without significant change. 3. Widening of the C4-5 disc interspace greatest anteriorly and minimal anterior slip C4 has been noted since 02/20/2018. It is therefore possible that widening of the C4-5 disc space is unrelated to recent trauma. Patient has a pacemaker in place limiting ability to get MR to exclude disc/ligamentous injury. 4. Remote C5 superior endplate compression fracture greater on the right. Remote T1 mild superior endplate compression fracture. Remote anterior wedge compression fracture T2 (involving superior and inferior endplate) with slight posterior bulging of posterior cortex. Findings are similar to 02/20/2018. 5.  No abnormal prevertebral soft tissue swelling. 6. Multilevel degenerative changes without high-grade spinal stenosis. Electronically Signed   By: Lacy Duverney M.D.   On: 07/15/2018 15:21   Dg Chest Portable 1 View  Result Date: 07/15/2018 CLINICAL DATA:  Altered mental status, hypertension. EXAM: PORTABLE CHEST 1 VIEW COMPARISON:  Chest x-rays dated 06/14/2018, 05/30/2018 and 02/20/2018. FINDINGS: Stable cardiomegaly. Mild interstitial prominence, RIGHT greater than LEFT, suggesting mild interstitial edema. No pleural effusion or pneumothorax seen. LEFT chest wall pacemaker/ICD leads appear stable in position. Again noted is the displaced fracture of the RIGHT humeral neck. Osseous structures about the chest are otherwise unremarkable. IMPRESSION: 1. Cardiomegaly. Mild interstitial prominence, most likely interstitial edema, suspicious for mild CHF. 2. Chronic appearing  fracture at the RIGHT humeral neck. Electronically Signed   By: Bary Richard M.D.   On: 07/15/2018 13:56   Dg Hip Port Unilat W Or Wo Pelvis 1 View Left  Result Date: 07/15/2018 CLINICAL DATA:  Fall, LEFT hip bruising, pain. EXAM: DG HIP (WITH OR WITHOUT PELVIS) 1V PORT LEFT COMPARISON:  Plain films of the pelvis and LEFT hip dated 07/13/2018, 04/24/2017 and 12/04/2015. FINDINGS: Osseous alignment is stable. No fracture line or displaced fracture fragment appreciated. No significant degenerative change at either hip joint. Prominent vascular calcifications throughout the soft tissues of the pelvis and upper thighs. IMPRESSION: No acute findings. No osseous fracture or dislocation. Electronically Signed   By: Bary Richard M.D.   On: 07/15/2018 13:58    Procedures Procedures (including critical care time)  Medications Ordered in ED Medications  ziprasidone (GEODON) injection 10 mg (10 mg Intramuscular Incomplete 07/15/18 1821)  cefTRIAXone (ROCEPHIN) 1 g in sodium chloride 0.9 % 100 mL IVPB (has no administration in time range)  LORazepam (ATIVAN) injection 0.5 mg (has no administration in time range)  LORazepam (ATIVAN) injection 1 mg (has no administration in time range)  sodium chloride 0.9 % bolus 500 mL (500 mLs Intravenous New Bag/Given 07/15/18 1705)  sterile water (preservative free) injection (1.2 mLs  Given 07/15/18 1800)     Initial Impression / Assessment and Plan / ED Course  I have reviewed the triage vital signs and the nursing notes.  Pertinent labs & imaging results that were available during my care of the patient were reviewed by me and considered in my medical decision making (see chart for details).     Patient presenting sent from nursing home for evaluation of altered mental status.  Has a history of dementia at baseline but is usually talkative and when she was last seen 3 days ago at [facility] emergency department she was A&O x3.  Has a history of frequent  falls, was seen 3 days ago for evaluation of fall out of bed.  Imaging at that time showed no acute changes, and she was found to be stable for discharge at the time.  Today she is afebrile, intermittently hypertensive in the ED.  On initial assessment, she does not open her eyes even to pain and withdraws from pain.  We will repeat head imaging and obtain lab work.  Imaging shows no acute intracranial abnormality and persistent cervical spine fractures.  These were identified at an ED visit on 06/12/2018 but patient does not tolerate a cervical collar.  Lab work shows no leukocytosis, anemia with hemoglobin of 8, elevated BUN and creatinine suggestive of dehydration.  She is mildly hyperkalemic.  UA suggestive of UTI, will culture and give Rocephin.  On reassessment, patient is more alert, opening  eyes to voice, localizing painful stimuli, and following some commands.  She is able to squeeze my hands.  She does exhibit some incomprehensible speech.  She tells me she is in pain but will not localize where.  4:24 PM Spoke with Dr. Maurice Small with neurosurgery with regard to patient's cervical spine fractures who will review the patient's images and evaluated the patient tomorrow. Does not recommend any additional intervention.   Spoke with Dr. Isidoro Donning with [facility] hospitalist service who agrees to assume care of patient and bring her into the hospital for further evaluation and management.  Final Clinical Impressions(s) / ED Diagnoses   Final diagnoses:  Altered mental status, unspecified altered mental status type  Acute cystitis without hematuria    ED Discharge Orders    None       Bennye Alm 07/15/18 1914    Gerhard Munch, MD 07/23/18 1926

## 2018-07-15 NOTE — ED Triage Notes (Signed)
[facility] for repeated falls over the last w3 weeks. Staff reports to EMS that pt has had decreased responsiveness and is not at her baseline. Does have history of dementia. Last fall was Saturday. Pt had recent visit for head bleed and cervical fracture, per staff, pt removes own C-Collar, presents in towel roll. Pt also has significant bruising to left hip, and left face.

## 2018-07-15 NOTE — ED Notes (Signed)
Got patient on the monitor did ekg shown to Dr Jeraldine Lootslockwood patient is resting with call bell in reach and nurse at bedside

## 2018-07-15 NOTE — ED Notes (Signed)
Iv at left forearm non functional.

## 2018-07-16 DIAGNOSIS — G9341 Metabolic encephalopathy: Secondary | ICD-10-CM | POA: Diagnosis not present

## 2018-07-16 LAB — CBC
HEMATOCRIT: 27.3 % — AB (ref 36.0–46.0)
HEMOGLOBIN: 8.2 g/dL — AB (ref 12.0–15.0)
MCH: 30.8 pg (ref 26.0–34.0)
MCHC: 30 g/dL (ref 30.0–36.0)
MCV: 102.6 fL — ABNORMAL HIGH (ref 80.0–100.0)
NRBC: 0 % (ref 0.0–0.2)
Platelets: 162 10*3/uL (ref 150–400)
RBC: 2.66 MIL/uL — ABNORMAL LOW (ref 3.87–5.11)
RDW: 13.9 % (ref 11.5–15.5)
WBC: 8.3 10*3/uL (ref 4.0–10.5)

## 2018-07-16 LAB — BASIC METABOLIC PANEL
Anion gap: 7 (ref 5–15)
BUN: 21 mg/dL (ref 8–23)
CHLORIDE: 110 mmol/L (ref 98–111)
CO2: 25 mmol/L (ref 22–32)
Calcium: 8.2 mg/dL — ABNORMAL LOW (ref 8.9–10.3)
Creatinine, Ser: 1.04 mg/dL — ABNORMAL HIGH (ref 0.44–1.00)
GFR calc Af Amer: 52 mL/min — ABNORMAL LOW (ref >60)
GFR calc non Af Amer: 45 mL/min — ABNORMAL LOW (ref >60)
Glucose, Bld: 90 mg/dL (ref 70–99)
POTASSIUM: 4.2 mmol/L (ref 3.5–5.1)
SODIUM: 142 mmol/L (ref 135–145)

## 2018-07-16 LAB — MRSA PCR SCREENING: MRSA BY PCR: NEGATIVE

## 2018-07-16 MED ORDER — HYDRALAZINE HCL 20 MG/ML IJ SOLN
10.0000 mg | Freq: Once | INTRAMUSCULAR | Status: AC
  Administered 2018-07-16: 10 mg via INTRAVENOUS
  Filled 2018-07-16: qty 1

## 2018-07-16 NOTE — Progress Notes (Signed)
Initial Nutrition Assessment  DOCUMENTATION CODES:   Not applicable  INTERVENTION:  Provide Ensure Enlive po BID, each supplement provides 350 kcal and 20 grams of protein  Encourage adequate PO intake.   NUTRITION DIAGNOSIS:   Increased nutrient needs related to chronic illness(CHF) as evidenced by estimated needs.  GOAL:   Patient will meet greater than or equal to 90% of their needs  MONITOR:   PO intake, Supplement acceptance, Labs, Weight trends, I & O's, Skin  REASON FOR ASSESSMENT:   Malnutrition Screening Tool    ASSESSMENT:   84 year old female with history of dementia, atrial fibrillation, not on anticoagulation, CKD stage IV, diastolic CHF, hypertension, frequent falls presented from skilled nursing facility for the evaluation of altered mental status for the last 3 days. UA positive for UTI  Pt unavailable during time of visit. RD unable to obtain most recent nutrition history. Meal completion 25% at breakfast this AM. Diet has just been advanced to a soft diet. No weight loss per weight records. RD to order nutritional supplements to aid in caloric and protein needs. Labs and medications reviewed.   Unable to complete Nutrition-Focused physical exam at this time.   Diet Order:   Diet Order            DIET SOFT Room service appropriate? Yes; Fluid consistency: Thin  Diet effective now              EDUCATION NEEDS:   Not appropriate for education at this time  Skin:  Skin Assessment: Reviewed RN Assessment  Last BM:  11/26  Height:   Ht Readings from Last 1 Encounters:  07/15/18 5\' 6"  (1.676 m)    Weight:   Wt Readings from Last 1 Encounters:  07/15/18 55.6 kg    Ideal Body Weight:  59 kg  BMI:  Body mass index is 19.78 kg/m.  Estimated Nutritional Needs:   Kcal:  1450-1600  Protein:  55-70 grams  Fluid:  >/= 1.5 L/day    Roslyn SmilingStephanie Sheela Mcculley, MS, RD, LDN Pager # [phone] After hours/ weekend pager # [phone]

## 2018-07-16 NOTE — Care Management Note (Signed)
Case Management Note  Patient Details  Name: Jennifer Bender MRN: [id number] Date of Birth: March 15, 1924  Subjective/Objective:  [facility], CSW aware.  Presents with encephalopathy, afib, recent falls, recnt ICH, may need palliative per MD.                  Action/Plan: NCM will follow for transition of care needs.   Expected Discharge Date:                  Expected Discharge Plan:  Skilled Nursing Facility  In-House Referral:  Clinical Social Work  Discharge planning Services  CM Consult  Post Acute Care Choice:    Choice offered to:     DME Arranged:    DME Agency:     HH Arranged:    HH Agency:     Status of Service:  In process, will continue to follow  If discussed at Long Length of Stay Meetings, dates discussed:    Additional Comments:  Leone Havenaylor, Maxden Naji Clinton, RN 07/16/2018, 4:10 PM

## 2018-07-16 NOTE — Progress Notes (Signed)
   07/16/18 0919  Clinical Encounter Type  Visited With Patient not available  Visit Type Initial  Referral From Nurse  Consult/Referral To Chaplain;Other (Comment) (AD)  The chaplain responded to spiritual care consult for Pt. AD.  After reading the Pt. chart and recognizing the RN was participating in patient care, the chaplain decided to postpone Pt. AD conversation to a later date.  The chaplain is available for F/U as requested by the Pt. and/or RN.

## 2018-07-16 NOTE — Progress Notes (Signed)
PROGRESS NOTE  Jennifer Sneddonvelyn Gal ZOX:[id number] DOB: [birth year] DOA: 07/15/2018 PCP: Default, Provider, MD  HPI/Recap of past 24 hours:  Pleasantly confused, only oriented to self Does not answer questions appropriately   Assessment/Plan: Principal Problem:   Acute metabolic encephalopathy Active Problems:   Atrial fibrillation (HCC)   CKD (chronic kidney disease) stage 4, GFR 15-29 ml/min (HCC)   HTN (hypertension)   C4 cervical fracture (HCC)   Hypothyroidism   Malnutrition of moderate degree   Acute lower UTI  Acute metabolic encephalopathy in the setting of dementia and worsened likely due to UTI, rule out any stroke -Patient received the Geodon in the ED due to agitation and hallucination -Today she is pleasantly confused although still have some hallucination -CT head showed small left frontal scalp hematoma however no skull fracture or intracranial hemorrhage.  Once patient is calm and cooperative, will obtain MRI of the brain to rule out any CVA/consider repeat CT head if patient is still not able to follow command   UTI,  urine culture greater than 100,000 colonies of identified organism Continue Rocephin for now, follow-up on culture result  Frequent falls  -With multiple fractures in the past  Nondisplaced C4 cervical fracture (HCC) -CT C-spine showed fracture of C4 posterior elements and fracture of anterior inferior aspect of C4 vertebral body without significant change. -Patient was seen by neurosurgery during the previous admission and had felt that due to patient's age and comorbidities she would not be a good candidate for surgery, head recommended Aspen collar -Neurosurgery was consulted by EDP, Dr. Maurice Smallstergard, recommended no neurosurgical interventions at this time.  Atrial fibrillation [facility](HCC) with a pacemaker Patient had 2D echo recently done with normal EF, pacemaker was interrogated in 05/2018 Not an anticoagulation candidate secondary to falls and advanced  dementia, delirium -Continue Cardizem -She is in chronic A. fib , rate controlled, will DC telemetry    CKD (chronic kidney disease) stage 4, GFR 15-29 ml/min (HCC) -Creatinine currently at baseline     HTN (hypertension) -Continue Cardizem  Hypothyroidism TSH 3.4, continue Synthroid  Microcytic anemia -mcv 102.6 -hgb 8 -B12/folic acid/TS H unremarkable   Code Status: DNR  Family Communication: patient   Disposition Plan: return to snf with palliative care following Patient had been seen by palliative care during the previous admission   Consultants:  EDP discussed case with Neurosurgery   Procedures:  none  Antibiotics:  rocephin   Objective: BP 131/71 (BP Location: Right Arm)   Pulse 76   Temp 97.9 F (36.6 C) (Oral)   Resp 18   Ht 5\' 6"  (1.676 m)   Wt 55.6 kg   SpO2 94%   BMI 19.78 kg/m   Intake/Output Summary (Last 24 hours) at 07/16/2018 1609 Last data filed at 07/16/2018 1425 Gross per 24 hour  Intake 786.2 ml  Output 1050 ml  Net -263.8 ml   Filed Weights   07/15/18 1323 07/15/18 2300  Weight: 55 kg 55.6 kg    Exam: Patient is examined daily including today on 07/16/2018, exams remain the same as of yesterday except that has changed    General:  NAD, pleasantly confused, abrasion/hematoma on left lateral leg, left arm and left forehead  Cardiovascular: RRR  Respiratory: CTABL  Abdomen: Soft/ND/NT, positive BS  Musculoskeletal: No Edema  Neuro: alert, oriented to self only  Data Reviewed: Basic Metabolic Panel: Recent Labs  Lab 07/13/18 0241 07/15/18 1501 07/15/18 1507 07/15/18 2219 07/16/18 0000  NA 142 142 142  --  142  K 4.4 5.3* 5.0  --  4.2  CL 111 114* 113*  --  110  CO2 26 21*  --   --  25  GLUCOSE 96 97 91  --  90  BUN 28* 26* 25*  --  21  CREATININE 0.99 1.23* 1.20* 1.12* 1.04*  CALCIUM 8.6* 8.4*  --   --  8.2*   Liver Function Tests: No results for input(s): AST, ALT, ALKPHOS, BILITOT, PROT, ALBUMIN  in the last 168 hours. No results for input(s): LIPASE, AMYLASE in the last 168 hours. Recent Labs  Lab 07/15/18 2219  AMMONIA 16   CBC: Recent Labs  Lab 07/13/18 0241 07/15/18 1501 07/15/18 1507 07/15/18 2219 07/16/18 0000  WBC 8.0 9.0  --  8.3 8.3  NEUTROABS 5.3 6.4  --   --   --   HGB 9.9* 8.0* 8.2* 8.3* 8.2*  HCT 32.7* 27.2* 24.0* 27.2* 27.3*  MCV 102.8* 107.1*  --  102.3* 102.6*  PLT 219 176  --  162 162   Cardiac Enzymes:   No results for input(s): CKTOTAL, CKMB, CKMBINDEX, TROPONINI in the last 168 hours. BNP (last 3 results) Recent Labs    03/12/18 0209 03/27/18 1804  BNP 109.8* 231.1*    ProBNP (last 3 results) No results for input(s): PROBNP in the last 8760 hours.  CBG: No results for input(s): GLUCAP in the last 168 hours.  Recent Results (from the past 240 hour(s))  Urine culture     Status: Abnormal (Preliminary result)   Collection Time: 07/15/18  3:43 PM  Result Value Ref Range Status   Specimen Description URINE, RANDOM  Final   Special Requests NONE  Final   Culture (A)  Final    >=100,000 COLONIES/mL UNIDENTIFIED ORGANISM Performed at [facility], [address] [place], [place] [address]    Report Status PENDING  Incomplete  Culture, blood (routine x 2)     Status: None (Preliminary result)   Collection Time: 07/15/18 11:55 PM  Result Value Ref Range Status   Specimen Description BLOOD LEFT ANTECUBITAL  Final   Special Requests   Final    BOTTLES DRAWN AEROBIC ONLY Blood Culture adequate volume   Culture PENDING  Incomplete   Report Status PENDING  Incomplete     Studies: No results found.  Scheduled Meds: . diltiazem  180 mg Oral Daily  . DULoxetine  30 mg Oral Daily  . heparin  5,000 Units Subcutaneous Q8H  . levothyroxine  88 mcg Oral QAC breakfast  . memantine  5 mg Oral Daily  . ziprasidone  10 mg Intramuscular Once    Continuous Infusions: . sodium chloride 75 mL/hr at 07/16/18 1425  . cefTRIAXone (ROCEPHIN)   IV Stopped (07/15/18 2100)     Time spent: I have personally reviewed and interpreted on  07/16/2018 daily labs, tele strips, imagings as discussed above under date review session and assessment and plans.  I reviewed all nursing notes, pharmacy notes,  vitals, pertinent old records  I have discussed plan of care as described above with RN , patient on 07/16/2018   Albertine Grates MD, PhD  [facility] Pager [phone]. If 7PM-7AM, please contact night-coverage at [email or web address], password [facility] 07/16/2018, 4:09 PM  LOS: 0 days

## 2018-07-16 NOTE — Clinical Social Work Note (Signed)
CSW received a call back from pt's daughter. Pt's daughter is frustrated because she is just now finding out at 11:00 today that her mom is in the hospital. Pt's daughter states [facility] and [facility] until this past Friday, when pt's son went and brought her home with him where she fell. Pt's daughter states she believed last admission that pt was "hospice." Pt's daughter had spoken with hospice last admission and states she was accepted and then her brother changed things and pt went to [facility]. Pt's daughter is in route to hospital. CSW will follow up with pt's family tomorrow to discuss plan.   PisgahBridget Jkayla Spiewak, [place]WA [phone]

## 2018-07-17 DIAGNOSIS — N3000 Acute cystitis without hematuria: Secondary | ICD-10-CM

## 2018-07-17 DIAGNOSIS — G9341 Metabolic encephalopathy: Secondary | ICD-10-CM | POA: Diagnosis not present

## 2018-07-17 DIAGNOSIS — R4182 Altered mental status, unspecified: Secondary | ICD-10-CM | POA: Diagnosis not present

## 2018-07-17 DIAGNOSIS — N390 Urinary tract infection, site not specified: Secondary | ICD-10-CM | POA: Diagnosis not present

## 2018-07-17 LAB — COMPREHENSIVE METABOLIC PANEL
ALBUMIN: 2.8 g/dL — AB (ref 3.5–5.0)
ALT: 19 U/L (ref 0–44)
ANION GAP: 5 (ref 5–15)
AST: 24 U/L (ref 15–41)
Alkaline Phosphatase: 134 U/L — ABNORMAL HIGH (ref 38–126)
BILIRUBIN TOTAL: 0.5 mg/dL (ref 0.3–1.2)
BUN: 22 mg/dL (ref 8–23)
CHLORIDE: 112 mmol/L — AB (ref 98–111)
CO2: 23 mmol/L (ref 22–32)
Calcium: 8.2 mg/dL — ABNORMAL LOW (ref 8.9–10.3)
Creatinine, Ser: 1.06 mg/dL — ABNORMAL HIGH (ref 0.44–1.00)
GFR calc Af Amer: 53 mL/min — ABNORMAL LOW (ref >60)
GFR, EST NON AFRICAN AMERICAN: 46 mL/min — AB (ref >60)
Glucose, Bld: 91 mg/dL (ref 70–99)
POTASSIUM: 4.3 mmol/L (ref 3.5–5.1)
Sodium: 140 mmol/L (ref 135–145)
TOTAL PROTEIN: 6.2 g/dL — AB (ref 6.5–8.1)

## 2018-07-17 LAB — URINE CULTURE: Culture: >=100000 — AB

## 2018-07-17 LAB — MAGNESIUM: MAGNESIUM: 2 mg/dL (ref 1.7–2.4)

## 2018-07-17 MED ORDER — AMOXICILLIN 500 MG PO CAPS
500.0000 mg | ORAL_CAPSULE | Freq: Two times a day (BID) | ORAL | Status: DC
  Administered 2018-07-17 – 2018-07-19 (×4): 500 mg via ORAL
  Filled 2018-07-17 (×4): qty 1

## 2018-07-17 NOTE — NC FL2 (Signed)
[place] MEDICAID FL2 LEVEL OF CARE SCREENING TOOL     IDENTIFICATION  Patient Name: Jennifer Bender Birthdate: 1923/10/23 Sex: female Admission Date (Current Location): 07/15/2018  [facility] and [place] Number:  Producer, television/film/video and Address:  [facility], [address] [place], [place] [address]      Provider Number: [id number]  Attending Physician Name and Address:  Leatha Gilding, MD  Relative Name and Phone Number:       Current Level of Care: Hospital Recommended Level of Care: Skilled Nursing Facility Prior Approval Number:    Date Approved/Denied:   PASRR Number: [id number]  Discharge Plan: SNF    Current Diagnoses: Patient Active Problem List   Diagnosis Date Noted  . Acute metabolic encephalopathy 07/15/2018  . Acute lower UTI 07/15/2018  . Atrial fibrillation (HCC) 06/14/2018  . CKD (chronic kidney disease) stage 4, GFR 15-29 ml/min (HCC) 06/14/2018  . CHF (congestive heart failure) (HCC) 06/14/2018  . HTN (hypertension) 06/14/2018  . C4 cervical fracture (HCC) 06/14/2018  . Hypothyroidism 06/14/2018  . Malnutrition of moderate degree 06/14/2018  . Syncope 06/13/2018    Orientation RESPIRATION BLADDER Height & Weight     (disoriented x4)  Normal External catheter, Incontinent(placed 11/25) Weight: 122 lb 9.2 oz (55.6 kg) Height:  5\' 6"  (167.6 cm)  BEHAVIORAL SYMPTOMS/MOOD NEUROLOGICAL BOWEL NUTRITION STATUS      Incontinent Diet(soft diet thin liquids)  AMBULATORY STATUS COMMUNICATION OF NEEDS Skin   Limited Assist Verbally Other (Comment)(Right arm skin tear, Impregnated gauze  dressing.  left head skin tear, Impregnated gauze (bismuth) dressing.  left pretibial skin tear, Non adherent dressing.  Left lower arm skin tear, Non adherent dressing.)                       Personal Care Assistance Level of Assistance  Bathing, Dressing, Feeding Bathing Assistance: Limited assistance Feeding assistance:  Independent Dressing Assistance: Limited assistance     Functional Limitations Info  Sight, Hearing, Speech Sight Info: Adequate Hearing Info: Adequate Speech Info: Adequate    SPECIAL CARE FACTORS FREQUENCY  PT (By licensed PT), OT (By licensed OT)                    Contractures Contractures Info: Not present    Additional Factors Info  Code Status, Allergies Code Status Info: DNR Allergies Info: Ace Inhibitors, Colchicine, Crestor Rosuvastatin Calcium, Ezetimibe, Hydrocodone-acetaminophen, Lipitor Atorvastatin Calcium, Morphine And Related, Nsaids, Pravachol, Rosuvastatin, Shellfish Allergy, Tolmetin, Tramadol, Zocor Simvastatin           Current Medications (07/17/2018):  This is the current hospital active medication list Current Facility-Administered Medications  Medication Dose Route Frequency Provider Last Rate Last Dose  . 0.9 %  sodium chloride infusion   Intravenous Continuous Rai, Ripudeep K, MD 75 mL/hr at 07/17/18 0411    . acetaminophen (TYLENOL) tablet 650 mg  650 mg Oral QID PRN Rai, Ripudeep K, MD   650 mg at 07/16/18 0620  . amoxicillin (AMOXIL) capsule 500 mg  500 mg Oral Q12H Gherghe, Costin M, MD      . diltiazem (CARDIZEM SR) 12 hr capsule 180 mg  180 mg Oral Daily Rai, Ripudeep K, MD   180 mg at 07/16/18 0902  . DULoxetine (CYMBALTA) DR capsule 30 mg  30 mg Oral Daily Rai, Ripudeep K, MD   30 mg at 07/16/18 0901  . heparin injection 5,000 Units  5,000 Units Subcutaneous Q8H Rai, Ripudeep  K, MD   5,000 Units at 07/17/18 0610  . levothyroxine (SYNTHROID, LEVOTHROID) tablet 88 mcg  88 mcg Oral QAC breakfast Rai, Ripudeep K, MD   88 mcg at 07/17/18 0610  . LORazepam (ATIVAN) injection 0.5 mg  0.5 mg Intravenous Q8H PRN Rai, Ripudeep K, MD   0.5 mg at 07/16/18 2156  . memantine (NAMENDA) tablet 5 mg  5 mg Oral Daily Rai, Ripudeep K, MD   5 mg at 07/16/18 0902  . ondansetron (ZOFRAN) tablet 4 mg  4 mg Oral Q6H PRN Rai, Ripudeep K, MD       Or  .  ondansetron (ZOFRAN) injection 4 mg  4 mg Intravenous Q6H PRN Rai, Ripudeep K, MD      . ziprasidone (GEODON) injection 10 mg  10 mg Intramuscular Once Rai, Delene Ruffiniipudeep K, MD         Discharge Medications: Please see discharge summary for a list of discharge medications.  Relevant Imaging Results:  Relevant Lab Results:   Additional Information SS#: [id number]. [facility].  Princella Jaskiewicz A Jacy Howat, LCSW

## 2018-07-17 NOTE — Evaluation (Signed)
Clinical/Bedside Swallow Evaluation Patient Details  Name: Jennifer Bender MRN: [id number] Date of Birth: [birth year]  Today's Date: 07/17/2018 Time: SLP Start Time (ACUTE ONLY): 1501 SLP Stop Time (ACUTE ONLY): 1510 SLP Time Calculation (min) (ACUTE ONLY): 9 min  Past Medical History:  Past Medical History:  Diagnosis Date  . Arthritis   . Atrial fibrillation (HCC)   . CHF (congestive heart failure) (HCC)   . Hypertension   . Pneumonia   . UTI (lower urinary tract infection)    Past Surgical History:  Past Surgical History:  Procedure Laterality Date  . ABDOMINAL HYSTERECTOMY    . BREAST REDUCTION SURGERY    . CARDIOVERSION    . HAND SURGERY     HPI:  84 year old female with history of dementia, atrial fibrillation, not on anticoagulation, CKD stage IV, diastolic CHF, hypertension, frequent falls presented from skilled nursing facility for the evaluation of altered mental status for the last 3 days. UA positive for UTI   Assessment / Plan / Recommendation Clinical Impression  Evaluation was significantly limited by pt mentation. At the time of this eval, she is disoriented, confused, and irritable. She yells out for people who are not there, swats at me, and tells me to leave. She did consume a few sips of water via straw with no overt difficulty. RN and NT report no overt difficulties with breakfast or lunch today. Her risk for aspiration may likely fluctuate as her mentation does, making it important to prioritize mroe lucid moments for eating and drinking. Would not make her take POs if she is not alert and accepting. Would continue current diet for now. SLP to f/u briefly to see if better assessment can be made. SLP Visit Diagnosis: Dysphagia, unspecified (R13.10)    Aspiration Risk  Mild aspiration risk;Moderate aspiration risk    Diet Recommendation Dysphagia 3 (Mech soft);Thin liquid   Liquid Administration via: Cup;Straw Medication Administration: Whole meds with  liquid Supervision: Staff to assist with self feeding;Full supervision/cueing for compensatory strategies Compensations: Minimize environmental distractions;Slow rate;Small sips/bites Postural Changes: Seated upright at 90 degrees    Other  Recommendations Oral Care Recommendations: Oral care BID   Follow up Recommendations 24 hour supervision/assistance      Frequency and Duration min 1 x/week  1 week       Prognosis Prognosis for Safe Diet Advancement: Guarded Barriers to Reach Goals: Cognitive deficits      Swallow Study   General HPI: 84 year old female with history of dementia, atrial fibrillation, not on anticoagulation, CKD stage IV, diastolic CHF, hypertension, frequent falls presented from skilled nursing facility for the evaluation of altered mental status for the last 3 days. UA positive for UTI Type of Study: Bedside Swallow Evaluation Previous Swallow Assessment: none in chart Diet Prior to this Study: Dysphagia 3 (soft);Thin liquids Temperature Spikes Noted: No Respiratory Status: Room air History of Recent Intubation: No Behavior/Cognition: Alert;Requires cueing Oral Cavity Assessment: (UTA) Oral Care Completed by SLP: No Oral Cavity - Dentition: (UTA) Self-Feeding Abilities: Total assist Patient Positioning: Upright in bed Baseline Vocal Quality: Normal    Oral/Motor/Sensory Function Overall Oral Motor/Sensory Function: (not directly assessed - see clinical impressions)   Ice Chips Ice chips: Not tested   Thin Liquid Thin Liquid: Within functional limits Presentation: Straw    Nectar Thick Nectar Thick Liquid: Not tested   Honey Thick Honey Thick Liquid: Not tested   Puree Puree: Not tested   Solid     Solid: Not tested  Maxcine Ham 07/17/2018,3:16 PM   Maxcine Ham, M.A. CCC-SLP Acute Herbalist [phone] Office [phone]

## 2018-07-17 NOTE — Evaluation (Signed)
Physical Therapy Evaluation Patient Details Name: Jennifer Bender MRN: [id number] DOB: [birth year] Today's Date: 07/17/2018   History of Present Illness  84 year old female with history of dementia, atrial fibrillation, not on anticoagulation, CKD stage IV, diastolic CHF, hypertension, frequent falls presented from skilled nursing facility for the evaluation of altered mental status for the last 3 days. UA positive for UTI     Clinical Impression  Pt admitted with above diagnosis. Pt currently with functional limitations due to the deficits listed below (see PT Problem List). PTA pt living at snf with history of falls per chart. Today patient confused and not oriented to place situation or time. Incoherent responses and unable to follow cues. Sat EOB with Max A. Unsafe to mobilize OOB at this time.  Pt will benefit from skilled PT to increase their independence and safety with mobility to allow discharge to the venue listed below.       Follow Up Recommendations SNF    Equipment Recommendations    none   Recommendations for Other Services       Precautions / Restrictions Precautions Precautions: Fall Restrictions Weight Bearing Restrictions: No      Mobility  Bed Mobility Overal bed mobility: Needs Assistance Bed Mobility: Supine to Sit           General bed mobility comments: pt with max A to come to sitting with hand held assist due to confusion   Transfers                    Ambulation/Gait                Stairs            Wheelchair Mobility    Modified Rankin (Stroke Patients Only)       Balance                                             Pertinent Vitals/Pain Pain Assessment: Faces Faces Pain Scale: No hurt    Home Living Family/patient expects to be discharged to:: Skilled nursing facility                      Prior Function Level of Independence: Needs assistance         Comments: unable to  communicate history     Hand Dominance        Extremity/Trunk Assessment   Upper Extremity Assessment Upper Extremity Assessment: Difficult to assess due to impaired cognition    Lower Extremity Assessment Lower Extremity Assessment: Difficult to assess due to impaired cognition       Communication   Communication: Expressive difficulties  Cognition Arousal/Alertness: Awake/alert Behavior During Therapy: Impulsive Overall Cognitive Status: No family/caregiver present to determine baseline cognitive functioning                                 General Comments: Pt AOx0, with inappropriate speech      General Comments      Exercises     Assessment/Plan    PT Assessment Patient needs continued PT services  PT Problem List         PT Treatment Interventions      PT Goals (Current goals can be found in the Care Plan section)  Acute Rehab PT  Goals Patient Stated Goal: non stated PT Goal Formulation: With patient Potential to Achieve Goals: Good    Frequency Min 2X/week   Barriers to discharge        Co-evaluation               AM-PAC PT "6 Clicks" Mobility  Outcome Measure Help needed turning from your back to your side while in a flat bed without using bedrails?: Total Help needed moving from lying on your back to sitting on the side of a flat bed without using bedrails?: Total Help needed moving to and from a bed to a chair (including a wheelchair)?: Total Help needed standing up from a chair using your arms (e.g., wheelchair or bedside chair)?: Total Help needed to walk in hospital room?: Total Help needed climbing 3-5 steps with a railing? : Total 6 Click Score: 6    End of Session Equipment Utilized During Treatment: Gait belt Activity Tolerance: Patient tolerated treatment well     PT Visit Diagnosis: Unsteadiness on feet (R26.81)    Time: [id number] PT Time Calculation (min) (ACUTE ONLY): 20 min   Charges:   PT  Evaluation $PT Eval Moderate Complexity: 1 Mod         Etta GrandchildSean Pegeen Stiger, PT, DPT Acute Rehabilitation Services Pager: [phone] Office: [phone]    Etta GrandchildSean Demaris Bousquet 07/17/2018, 5:07 PM

## 2018-07-17 NOTE — Progress Notes (Signed)
Palliative Medicine RN Note: Consult order noted.   Due to high consult volume and holiday staffing, there may be a delay seeing this patient. New referrals are triaged/evaluated by our providers and seen accordingly.  Additionally, surrogate decision maker needs to be established for us to schedule an effective meeting. I note that there is conflict/dispute about who has the HCPOA. If there is indeed an HCPOA document, please leave a progress note and place a copy in the chart. If there is no documented HCPOA, then our provider will involve the next person or persons in line based upon [place] law.  Thank you for inviting us to see your patient. If recommendations are needed in the interim, please call our office at [phone] from [phone].  Margret ChanceMelanie G. Lucyle Alumbaugh, RN, BSN, [facility] Palliative Medicine Team 07/17/2018 12:31 PM Office [phone]

## 2018-07-17 NOTE — Progress Notes (Signed)
PROGRESS NOTE  Jennifer Bender ZOX:[id number] DOB: 04-10-1924 DOA: 07/15/2018 PCP: Default, Provider, MD   LOS: 0 days   Brief Narrative / Interim history: 84 year old female with history of dementia, A. fib not on anticoagulation, CKD stage IV, diastolic CHF, hypertension, frequent falls, currently residing in an SNF/memory care unit came in with worsening confusion over the last few days.  She was diagnosed with a UTI, admitted to the hospital and placed on antibiotics  Subjective: Confused but alert, does not know where she is or why he is here  Assessment & Plan: Principal Problem:   Acute metabolic encephalopathy Active Problems:   Atrial fibrillation (HCC)   CKD (chronic kidney disease) stage 4, GFR 15-29 ml/min (HCC)   HTN (hypertension)   C4 cervical fracture (HCC)   Hypothyroidism   Malnutrition of moderate degree   Acute lower UTI   Acute metabolic encephalopathy in the setting of dementia/in-hospital delirium worsening likely due to urinary tract infection -Received Geodon in the ED agitation hallucinations -Remains pleasantly confused, still having some hallucinations -CT of the head showed small left frontal scalp hematoma however no skull fracture or intracranial hemorrhage -Likely due to UTI  UTI -Growing enterococcus, placed on amoxicillin  Frequent falls -With multiple fractures in the past  Nondisplaced C4 cervical fracture -CT C-spine showed fracture of C4 posterior elements and fracture of anterior inferior aspect of C4 vertebral body without significant change. -Patient was seen by neurosurgery during the previous admission and had felt that due to patient's age and comorbidities she would not be a good candidate for surgery, head recommended Aspen collar -Neurosurgery was consulted by EDP, Dr. Maurice Smallstergard, recommended no neurosurgical interventions at this time.  Permanent A. fib, currently has a pacemaker -Normal EF on recent 2D echo, pacemaker  interrogated in October 2019 -Not a candidate for anticoagulation due to advanced dementia and recurrent falls -Continue Cardizem  Chronic kidney disease stage IV -Creatinine currently appears at baseline  Hypertension -Continue Cardizem  Hypothyroidism -TSH normal, continue Synthroid  Microcytic anemia -Hemoglobin stable   Scheduled Meds: . amoxicillin  500 mg Oral Q12H  . diltiazem  180 mg Oral Daily  . DULoxetine  30 mg Oral Daily  . heparin  5,000 Units Subcutaneous Q8H  . levothyroxine  88 mcg Oral QAC breakfast  . memantine  5 mg Oral Daily  . ziprasidone  10 mg Intramuscular Once   Continuous Infusions: . sodium chloride 75 mL/hr at 07/17/18 0411   PRN Meds:.acetaminophen, LORazepam, ondansetron **OR** ondansetron (ZOFRAN) IV  DVT prophylaxis: heparin Code Status: DNR Family Communication: son at bedside Disposition Plan: back to SNF in 1 day  Consultants:   Palliative   Procedures:   None   Antimicrobials:  Ceftriaxone >> Augmentin 11/27 >>   Objective: Vitals:   07/16/18 0750 07/16/18 1602 07/16/18 2348 07/17/18 0729  BP: (!) 136/56 131/71 139/65 (!) 152/80  Pulse: 73 76 89 83  Resp:  18 20 18   Temp:   97.6 F (36.4 C) 98.3 F (36.8 C)  TempSrc:    Oral  SpO2: 90% 94%  98%  Weight:      Height:        Intake/Output Summary (Last 24 hours) at 07/17/2018 1510 Last data filed at 07/16/2018 1900 Gross per 24 hour  Intake 944.45 ml  Output 150 ml  Net 794.45 ml   Filed Weights   07/15/18 1323 07/15/18 2300  Weight: 55 kg 55.6 kg    Examination:  Constitutional: No distress, agitated, alert Eyes:  PERRL, lids and conjunctivae normal ENMT: Mucous membranes are moist.  Neck: normal, supple Respiratory: clear to auscultation bilaterally, no wheezing, no crackles.  Cardiovascular: Regular rate and rhythm. No LE edema. 2+ pedal pulses. No carotid bruits.  Abdomen: no tenderness.  Musculoskeletal: no clubbing / cyanosis.  Skin: no  rashes Neurologic: Moves all 4 spontaneously, does not follow commands consistently   Data Reviewed: I have independently reviewed following labs and imaging studies   CBC: Recent Labs  Lab 07/13/18 0241 07/15/18 1501 07/15/18 1507 07/15/18 2219 07/16/18 0000  WBC 8.0 9.0  --  8.3 8.3  NEUTROABS 5.3 6.4  --   --   --   HGB 9.9* 8.0* 8.2* 8.3* 8.2*  HCT 32.7* 27.2* 24.0* 27.2* 27.3*  MCV 102.8* 107.1*  --  102.3* 102.6*  PLT 219 176  --  162 162   Basic Metabolic Panel: Recent Labs  Lab 07/13/18 0241 07/15/18 1501 07/15/18 1507 07/15/18 2219 07/16/18 0000 07/17/18 0248  NA 142 142 142  --  142 140  K 4.4 5.3* 5.0  --  4.2 4.3  CL 111 114* 113*  --  110 112*  CO2 26 21*  --   --  25 23  GLUCOSE 96 97 91  --  90 91  BUN 28* 26* 25*  --  21 22  CREATININE 0.99 1.23* 1.20* 1.12* 1.04* 1.06*  CALCIUM 8.6* 8.4*  --   --  8.2* 8.2*  MG  --   --   --   --   --  2.0   GFR: Estimated Creatinine Clearance: 29.7 mL/min (A) (by C-G formula based on SCr of 1.06 mg/dL (H)). Liver Function Tests: Recent Labs  Lab 07/17/18 0248  AST 24  ALT 19  ALKPHOS 134*  BILITOT 0.5  PROT 6.2*  ALBUMIN 2.8*   No results for input(s): LIPASE, AMYLASE in the last 168 hours. Recent Labs  Lab 07/15/18 2219  AMMONIA 16   Coagulation Profile: No results for input(s): INR, PROTIME in the last 168 hours. Cardiac Enzymes: No results for input(s): CKTOTAL, CKMB, CKMBINDEX, TROPONINI in the last 168 hours. BNP (last 3 results) No results for input(s): PROBNP in the last 8760 hours. HbA1C: No results for input(s): HGBA1C in the last 72 hours. CBG: No results for input(s): GLUCAP in the last 168 hours. Lipid Profile: No results for input(s): CHOL, HDL, LDLCALC, TRIG, CHOLHDL, LDLDIRECT in the last 72 hours. Thyroid Function Tests: Recent Labs    07/15/18 2219  TSH 3.641   Anemia Panel: Recent Labs    07/15/18 2219  VITAMINB12 463  FOLATE 19.4   Urine analysis:    Component  Value Date/Time   COLORURINE YELLOW 07/15/2018 1326   APPEARANCEUR HAZY (A) 07/15/2018 1326   LABSPEC 1.020 07/15/2018 1326   PHURINE 5.0 07/15/2018 1326   GLUCOSEU NEGATIVE 07/15/2018 1326   HGBUR NEGATIVE 07/15/2018 1326   BILIRUBINUR NEGATIVE 07/15/2018 1326   KETONESUR NEGATIVE 07/15/2018 1326   PROTEINUR NEGATIVE 07/15/2018 1326   NITRITE NEGATIVE 07/15/2018 1326   LEUKOCYTESUR LARGE (A) 07/15/2018 1326   Sepsis Labs: Invalid input(s): PROCALCITONIN, LACTICIDVEN  Recent Results (from the past 240 hour(s))  Urine culture     Status: Abnormal   Collection Time: 07/15/18  3:43 PM  Result Value Ref Range Status   Specimen Description URINE, RANDOM  Final   Special Requests   Final    NONE Performed at [facility], [address] [place], [place] [address]  Culture >=100,000 COLONIES/mL ENTEROCOCCUS FAECALIS (A)  Final   Report Status 07/17/2018 FINAL  Final   Organism ID, Bacteria ENTEROCOCCUS FAECALIS (A)  Final      Susceptibility   Enterococcus faecalis - MIC*    AMPICILLIN <=2 SENSITIVE Sensitive     LEVOFLOXACIN >=8 RESISTANT Resistant     NITROFURANTOIN <=16 SENSITIVE Sensitive     VANCOMYCIN 1 SENSITIVE Sensitive     * >=100,000 COLONIES/mL ENTEROCOCCUS FAECALIS  Culture, blood (routine x 2)     Status: None (Preliminary result)   Collection Time: 07/15/18 11:55 PM  Result Value Ref Range Status   Specimen Description BLOOD LEFT ANTECUBITAL  Final   Special Requests   Final    BOTTLES DRAWN AEROBIC ONLY Blood Culture adequate volume   Culture   Final    NO GROWTH 1 DAY Performed at [facility], [address], [place], [place] [address]    Report Status PENDING  Incomplete  Culture, blood (routine x 2)     Status: None (Preliminary result)   Collection Time: 07/16/18 12:00 AM  Result Value Ref Range Status   Specimen Description BLOOD LEFT ANTECUBITAL  Final   Special Requests   Final    BOTTLES DRAWN AEROBIC ONLY Blood Culture results may  not be optimal due to an inadequate volume of blood received in culture bottles   Culture   Final    NO GROWTH 1 DAY Performed at [facility], [address], [place], [place] [address]    Report Status PENDING  Incomplete  MRSA PCR Screening     Status: None   Collection Time: 07/16/18  2:28 PM  Result Value Ref Range Status   MRSA by PCR NEGATIVE NEGATIVE Final    Comment:        The GeneXpert MRSA Assay (FDA approved for NASAL specimens only), is one component of a comprehensive MRSA colonization surveillance program. It is not intended to diagnose MRSA infection nor to guide or monitor treatment for MRSA infections. Performed at [facility], [address], [place], [place] [address]       Radiology Studies: No results found.  Pamella Pert, MD, PhD [facility] Pager [phone]  If 7PM-7AM, please contact night-coverage [email or web address] Password [email or web address] 07/17/2018, 3:10 PM

## 2018-07-17 NOTE — Clinical Social Work Note (Signed)
According to pt's daughter, pt's son is HCPOA however, we do not have paperwork on file. CSW has attempted to reach pt's son yesterday and today. CSW has left two voicemail's. Pt's daughter believes pt should be "hospice." However, if pt's son is indeed HCPOA this would be his decision.   [place] PlainfieldBridget Duha Abair, [place]WA [phone]

## 2018-07-18 DIAGNOSIS — G9341 Metabolic encephalopathy: Secondary | ICD-10-CM | POA: Diagnosis not present

## 2018-07-18 NOTE — Progress Notes (Signed)
PROGRESS NOTE  Jennifer Bender ZOX:[id number] DOB: 12/20/1923 DOA: 07/15/2018 PCP: Default, Provider, MD   LOS: 0 days   Brief Narrative / Interim history: 84 year old female with history of dementia, A. fib not on anticoagulation, CKD stage IV, diastolic CHF, hypertension, frequent falls, currently residing in an SNF/memory care unit came in with worsening confusion over the last few days.  She was diagnosed with a UTI, admitted to the hospital and placed on antibiotics  Subjective: -Remains confused, no specific complaints but does not directly answer any my questions  Assessment & Plan: Principal Problem:   Acute metabolic encephalopathy Active Problems:   Atrial fibrillation (HCC)   CKD (chronic kidney disease) stage 4, GFR 15-29 ml/min (HCC)   HTN (hypertension)   C4 cervical fracture (HCC)   Hypothyroidism   Malnutrition of moderate degree   Acute lower UTI   Acute metabolic encephalopathy in the setting of dementia/in-hospital delirium worsening likely due to urinary tract infection -Received Geodon in the ED agitation hallucinations -Remains pleasantly confused, still having some hallucinations -CT of the head showed small left frontal scalp hematoma however no skull fracture or intracranial hemorrhage -Encephalopathy in the setting of a UTI microbiology with enterococcus as below  UTI -Continue amoxicillin  Frequent falls -With multiple fractures in the past  Goals of care -There are some differences in opinions regarding moving forward between her son and daughter, palliative care has been consulted, family will meet and decide for the patient will benefit from SNF versus hospice  Nondisplaced C4 cervical fracture -CT C-spine showed fracture of C4 posterior elements and fracture of anterior inferior aspect of C4 vertebral body without significant change. -Patient was seen by neurosurgery during the previous admission and had felt that due to patient's age and  comorbidities she would not be a good candidate for surgery, head recommended Aspen collar -Neurosurgery was consulted by EDP, Dr. Maurice Smallstergard, recommended no neurosurgical interventions at this time.  Permanent A. fib, currently has a pacemaker -Normal EF on recent 2D echo, pacemaker interrogated in October 2019 -Not a candidate for anticoagulation due to advanced dementia and recurrent falls -Continue Cardizem  Chronic kidney disease stage IV -Creatinine currently appears at baseline  Hypertension -Continue Cardizem  Hypothyroidism -TSH normal, continue Synthroid  Microcytic anemia -Hemoglobin stable   Scheduled Meds: . amoxicillin  500 mg Oral Q12H  . diltiazem  180 mg Oral Daily  . DULoxetine  30 mg Oral Daily  . heparin  5,000 Units Subcutaneous Q8H  . levothyroxine  88 mcg Oral QAC breakfast  . memantine  5 mg Oral Daily  . ziprasidone  10 mg Intramuscular Once   Continuous Infusions: . sodium chloride 75 mL/hr at 07/17/18 1953   PRN Meds:.acetaminophen, LORazepam, ondansetron **OR** ondansetron (ZOFRAN) IV  DVT prophylaxis: heparin Code Status: DNR Family Communication: no family  Disposition Plan: back to SNF when GOC clear, palliative consult pending   Consultants:   Palliative   Procedures:   None   Antimicrobials:  Ceftriaxone >> Augmentin 11/27 >>   Objective: Vitals:   07/17/18 0729 07/17/18 1710 07/17/18 2335 07/18/18 0802  BP: (!) 152/80 (!) 163/79 (!) 159/68 (!) 127/57  Pulse: 83 85 78 90  Resp: 18  18   Temp: 98.3 F (36.8 C) 97.7 F (36.5 C) 97.6 F (36.4 C) 97.6 F (36.4 C)  TempSrc: Oral Oral Oral Oral  SpO2: 98% 93% 92% 99%  Weight:      Height:        Intake/Output Summary (Last 24  hours) at 07/18/2018 1152 Last data filed at 07/18/2018 0600 Gross per 24 hour  Intake -  Output 450 ml  Net -450 ml   Filed Weights   07/15/18 1323 07/15/18 2300  Weight: 55 kg 55.6 kg    Examination:  Constitutional: NAD Respiratory:  CTA Cardiovascular: RRR  Data Reviewed: I have independently reviewed following labs and imaging studies   CBC: Recent Labs  Lab 07/13/18 0241 07/15/18 1501 07/15/18 1507 07/15/18 2219 07/16/18 0000  WBC 8.0 9.0  --  8.3 8.3  NEUTROABS 5.3 6.4  --   --   --   HGB 9.9* 8.0* 8.2* 8.3* 8.2*  HCT 32.7* 27.2* 24.0* 27.2* 27.3*  MCV 102.8* 107.1*  --  102.3* 102.6*  PLT 219 176  --  162 162   Basic Metabolic Panel: Recent Labs  Lab 07/13/18 0241 07/15/18 1501 07/15/18 1507 07/15/18 2219 07/16/18 0000 07/17/18 0248  NA 142 142 142  --  142 140  K 4.4 5.3* 5.0  --  4.2 4.3  CL 111 114* 113*  --  110 112*  CO2 26 21*  --   --  25 23  GLUCOSE 96 97 91  --  90 91  BUN 28* 26* 25*  --  21 22  CREATININE 0.99 1.23* 1.20* 1.12* 1.04* 1.06*  CALCIUM 8.6* 8.4*  --   --  8.2* 8.2*  MG  --   --   --   --   --  2.0   GFR: Estimated Creatinine Clearance: 29.7 mL/min (A) (by C-G formula based on SCr of 1.06 mg/dL (H)). Liver Function Tests: Recent Labs  Lab 07/17/18 0248  AST 24  ALT 19  ALKPHOS 134*  BILITOT 0.5  PROT 6.2*  ALBUMIN 2.8*   No results for input(s): LIPASE, AMYLASE in the last 168 hours. Recent Labs  Lab 07/15/18 2219  AMMONIA 16   Coagulation Profile: No results for input(s): INR, PROTIME in the last 168 hours. Cardiac Enzymes: No results for input(s): CKTOTAL, CKMB, CKMBINDEX, TROPONINI in the last 168 hours. BNP (last 3 results) No results for input(s): PROBNP in the last 8760 hours. HbA1C: No results for input(s): HGBA1C in the last 72 hours. CBG: No results for input(s): GLUCAP in the last 168 hours. Lipid Profile: No results for input(s): CHOL, HDL, LDLCALC, TRIG, CHOLHDL, LDLDIRECT in the last 72 hours. Thyroid Function Tests: Recent Labs    07/15/18 2219  TSH 3.641   Anemia Panel: Recent Labs    07/15/18 2219  VITAMINB12 463  FOLATE 19.4   Urine analysis:    Component Value Date/Time   COLORURINE YELLOW 07/15/2018 1326    APPEARANCEUR HAZY (A) 07/15/2018 1326   LABSPEC 1.020 07/15/2018 1326   PHURINE 5.0 07/15/2018 1326   GLUCOSEU NEGATIVE 07/15/2018 1326   HGBUR NEGATIVE 07/15/2018 1326   BILIRUBINUR NEGATIVE 07/15/2018 1326   KETONESUR NEGATIVE 07/15/2018 1326   PROTEINUR NEGATIVE 07/15/2018 1326   NITRITE NEGATIVE 07/15/2018 1326   LEUKOCYTESUR LARGE (A) 07/15/2018 1326   Sepsis Labs: Invalid input(s): PROCALCITONIN, LACTICIDVEN  Recent Results (from the past 240 hour(s))  Urine culture     Status: Abnormal   Collection Time: 07/15/18  3:43 PM  Result Value Ref Range Status   Specimen Description URINE, RANDOM  Final   Special Requests   Final    NONE Performed at [facility], [address], [place], [place] [address]    Culture >=100,000 COLONIES/mL ENTEROCOCCUS FAECALIS (A)  Final  Report Status 07/17/2018 FINAL  Final   Organism ID, Bacteria ENTEROCOCCUS FAECALIS (A)  Final      Susceptibility   Enterococcus faecalis - MIC*    AMPICILLIN <=2 SENSITIVE Sensitive     LEVOFLOXACIN >=8 RESISTANT Resistant     NITROFURANTOIN <=16 SENSITIVE Sensitive     VANCOMYCIN 1 SENSITIVE Sensitive     * >=100,000 COLONIES/mL ENTEROCOCCUS FAECALIS  Culture, blood (routine x 2)     Status: None (Preliminary result)   Collection Time: 07/15/18 11:55 PM  Result Value Ref Range Status   Specimen Description BLOOD LEFT ANTECUBITAL  Final   Special Requests   Final    BOTTLES DRAWN AEROBIC ONLY Blood Culture adequate volume   Culture   Final    NO GROWTH 2 DAYS Performed at [facility], [address] [place], [place] [address]    Report Status PENDING  Incomplete  Culture, blood (routine x 2)     Status: None (Preliminary result)   Collection Time: 07/16/18 12:00 AM  Result Value Ref Range Status   Specimen Description BLOOD LEFT ANTECUBITAL  Final   Special Requests   Final    BOTTLES DRAWN AEROBIC ONLY Blood Culture results may not be optimal due to an inadequate volume of blood  received in culture bottles   Culture   Final    NO GROWTH 2 DAYS Performed at [facility], [address], [place], [place] [address]    Report Status PENDING  Incomplete  MRSA PCR Screening     Status: None   Collection Time: 07/16/18  2:28 PM  Result Value Ref Range Status   MRSA by PCR NEGATIVE NEGATIVE Final    Comment:        The GeneXpert MRSA Assay (FDA approved for NASAL specimens only), is one component of a comprehensive MRSA colonization surveillance program. It is not intended to diagnose MRSA infection nor to guide or monitor treatment for MRSA infections. Performed at [facility], [address] [place], [place] [address]       Radiology Studies: No results found.  Pamella Pert, MD, PhD [facility] Pager [phone]  If 7PM-7AM, please contact night-coverage [email or web address] Password [facility] 07/18/2018, 11:52 AM

## 2018-07-19 DIAGNOSIS — N184 Chronic kidney disease, stage 4 (severe): Secondary | ICD-10-CM | POA: Diagnosis not present

## 2018-07-19 DIAGNOSIS — R4182 Altered mental status, unspecified: Secondary | ICD-10-CM

## 2018-07-19 DIAGNOSIS — G9341 Metabolic encephalopathy: Secondary | ICD-10-CM | POA: Diagnosis not present

## 2018-07-19 DIAGNOSIS — N390 Urinary tract infection, site not specified: Secondary | ICD-10-CM | POA: Diagnosis not present

## 2018-07-19 DIAGNOSIS — N3000 Acute cystitis without hematuria: Secondary | ICD-10-CM | POA: Diagnosis not present

## 2018-07-19 DIAGNOSIS — Z515 Encounter for palliative care: Secondary | ICD-10-CM

## 2018-07-19 DIAGNOSIS — Z7189 Other specified counseling: Secondary | ICD-10-CM

## 2018-07-19 LAB — VITAMIN B1: VITAMIN B1 (THIAMINE): 90.8 nmol/L (ref 66.5–200.0)

## 2018-07-19 MED ORDER — SODIUM CHLORIDE 0.9 % IV SOLN: 250.0000 mL | INTRAVENOUS | Status: DC | PRN

## 2018-07-19 MED ORDER — SODIUM CHLORIDE 0.9% FLUSH
3.0000 mL | Freq: Two times a day (BID) | INTRAVENOUS | Status: DC
  Administered 2018-07-19: 3 mL via INTRAVENOUS

## 2018-07-19 MED ORDER — SODIUM CHLORIDE 0.9% FLUSH: 3.0000 mL | INTRAVENOUS | Status: DC | PRN

## 2018-07-19 MED ORDER — HALOPERIDOL LACTATE 2 MG/ML PO CONC
0.5000 mg | ORAL | Status: DC | PRN
  Filled 2018-07-19: qty 0.3

## 2018-07-19 MED ORDER — ACETAMINOPHEN 160 MG/5ML PO SOLN
1000.0000 mg | Freq: Three times a day (TID) | ORAL | Status: DC
  Administered 2018-07-19: 1000 mg via ORAL
  Filled 2018-07-19: qty 40.6

## 2018-07-19 MED ORDER — DILTIAZEM HCL ER COATED BEADS 180 MG PO CP24
180.0000 mg | ORAL_CAPSULE | Freq: Every day | ORAL | Status: DC
  Administered 2018-07-19: 180 mg via ORAL
  Filled 2018-07-19: qty 1

## 2018-07-19 MED ORDER — HALOPERIDOL LACTATE 5 MG/ML IJ SOLN: 0.5000 mg | INTRAMUSCULAR | Status: DC | PRN

## 2018-07-19 MED ORDER — HALOPERIDOL 0.5 MG PO TABS: 0.5000 mg | ORAL_TABLET | ORAL | Status: DC | PRN

## 2018-07-19 NOTE — Progress Notes (Signed)
Hospice called for report. Transport arranged for 4pm. Patient will leave with saline lock in place and this was communicated and agreed upon by hospice staff.

## 2018-07-19 NOTE — Discharge Summary (Signed)
Physician Discharge Summary  Jennifer Morrish ZOX:[id number] DOB: 02/22/1924 DOA: 07/15/2018  PCP: Default, Provider, MD  Admit date: 07/15/2018 Discharge date: 07/19/2018  Admitted From: SNF Disposition:  Residential hospice  Recommendations for Outpatient Follow-up:  1. Discharged to hospice  Discharge Condition: guarded CODE STATUS: DNR Diet recommendation: comfort feeding   HPI: Per Dr. Isidoro Donning Patient is a 84 year old female with history of dementia, atrial fibrillation, not on anticoagulation, CKD stage IV, diastolic CHF, hypertension, frequent falls presented from skilled nursing facility for the evaluation of altered mental status for the last 3 days.  Patient was evaluated at [facility], ED on 11/22 when she had presented with a fall out of bed.  At baseline, patient is wheelchair-bound.  All the imaging was negative and patient was sent back to the facility, apparently was completely alert and oriented at her baseline.  Patient then subsequently became more confused and progressively less responsive.  At the time of my encounter, she is agitated, rambling and hallucinating.  Hospital Course:  Principal problem Acute metabolic encephalopathy in the setting of dementia/in-hospital delirium worsening likely due to urinary tract infection -Received Geodon in the ED agitation hallucinations, her UTI was initially treated with IV antibiotics but speciated enterococcus and was narrowed to amoxicillin.  She finished a course while hospitalized. CT of the head showed small left frontal scalp hematoma however no skull fracture or intracranial hemorrhage  Additional problems UTI -finished a course of amoxicillin Frequent falls -With multiple fractures in the past Goals of care -palliative care consulted, will be discharged to residential hospice Nondisplaced C4 cervical fracture -CT C-spine showed fracture of C4 posterior elements and fracture of anterior inferior aspect of C4 vertebral  body without significant change. -Patient was seen by neurosurgery during the previous admission and had felt that due to patient's age and comorbidities she would not be a good candidate for surgery, head recommended Aspen collar -Neurosurgery was consulted by EDP, Dr. Maurice Small, recommended no neurosurgical interventions at this time. Permanent A. fib, currently has a pacemaker -Normal EF on recent 2D echo, pacemaker interrogated in October 2019 -Not a candidate for anticoagulation due to advanced dementia and recurrent falls -Continue Cardizem Chronic kidney disease stage IV -Creatinine currently appears at baseline Hypertension -Continue Cardizem Hypothyroidism -TSH normal, continue Synthroid Microcytic anemia -Hemoglobin stable   Discharge Diagnoses:  Principal Problem:   Acute metabolic encephalopathy Active Problems:   Atrial fibrillation (HCC)   CKD (chronic kidney disease) stage 4, GFR 15-29 ml/min (HCC)   HTN (hypertension)   C4 cervical fracture (HCC)   Hypothyroidism   Malnutrition of moderate degree   Acute lower UTI   Altered mental status   Palliative care encounter   Encounter for hospice care discussion     Discharge Instructions   Allergies as of 07/19/2018      Reactions   Ace Inhibitors    Angioedema   Colchicine    unknown   Crestor [rosuvastatin Calcium]    Myalgias   Ezetimibe Other (See Comments)   Hydrocodone-acetaminophen Nausea Only   Lipitor [atorvastatin Calcium]    Myalgias    Morphine And Related Other (See Comments)   Unknown   Nsaids Nausea And Vomiting   Renal insufficiency  Renal sufficient   Pravachol Nausea Only   Rosuvastatin Other (See Comments)   Unknown   Shellfish Allergy Other (See Comments)   Tolmetin Other (See Comments)   Renal insufficiency    Tramadol Nausea Only   Zocor [simvastatin]    Myalgias  Medication List    STOP taking these medications   DAILY VITE PO   fish oil-omega-3 fatty acids  1000 MG capsule   potassium chloride 10 MEQ tablet Commonly known as:  K-DUR,KLOR-CON     TAKE these medications   acetaminophen 325 MG tablet Commonly known as:  TYLENOL Take 650 mg by mouth 4 (four) times daily as needed for mild pain.   BLUE GEL 2 % Gel Generic drug:  Menthol (Topical Analgesic) Apply 1 application topically 3 (three) times daily.   diltiazem 180 MG 24 hr capsule Commonly known as:  DILACOR XR Take 180 mg by mouth daily.   DULoxetine 30 MG capsule Commonly known as:  CYMBALTA Take 30 mg by mouth daily.   feeding supplement (ENSURE ENLIVE) Liqd Take 237 mLs by mouth 2 (two) times daily between meals.   levothyroxine 88 MCG tablet Commonly known as:  SYNTHROID, LEVOTHROID Take 88 mcg by mouth daily before breakfast.   Melatonin 5 MG Tabs Take 5 mg by mouth at bedtime as needed (sleep).   memantine 5 MG tablet Commonly known as:  NAMENDA Take 5 mg by mouth daily.   polyethylene glycol packet Commonly known as:  MIRALAX / GLYCOLAX Take 17 g by mouth daily.      Consultations:  Palliative care  Procedures/Studies:  Ct Head Wo Contrast  Result Date: 07/15/2018 CLINICAL DATA:  84 year old female with falls over the past 3 weeks. Decreased responsiveness. History of dementia. Cervical spine fracture. Patient removes C-collar. Subsequent encounter. EXAM: CT HEAD WITHOUT CONTRAST CT CERVICAL SPINE WITHOUT CONTRAST TECHNIQUE: Multidetector CT imaging of the head and cervical spine was performed following the standard protocol without intravenous contrast. Multiplanar CT image reconstructions of the cervical spine were also generated. COMPARISON:  Several prior exams including 06/12/2018, 06/06/2018, 06/13/2018, 06/12/2018, 05/28/2018 and 02/20/2018 CTs. FINDINGS: CT HEAD FINDINGS Brain: No intracranial hemorrhage or CT evidence of large acute infarct. Chronic microvascular changes. Global atrophy. No intracranial mass lesion noted on this unenhanced exam.  Vascular: Vascular calcifications.  No acute hyperdense vessel. Skull: No skull fracture. Sinuses/Orbits: No acute orbital abnormality. Opacification right frontal sinus, right ethmoid sinus air cells and right maxillary sinus. Mild mucosal thickening left sphenoid sinus with adjacent bone thickening consistent with changes of chronic sinusitis. Mastoid air cells and middle ear cavities are clear. Other: Small left frontal scalp hematoma once again noted. CT CERVICAL SPINE FINDINGS Alignment/vertebra: Rotation of the head. Fracture of the C4 posterior elements (spinous process/lamina junction) and fracture of the anterior inferior aspect of the C4 vertebral body without significant change. Widening of the C4-5 disc interspace greatest anteriorly and minimal anterior slip C4 has been noted since 02/20/2018. It is therefore possible that widening of the C4-5 disc space may be unrelated to recent trauma. Patient has a pacemaker in place limiting ability to get MR to exclude disc/ligamentous injury. Remote C5 superior endplate compression fracture greater on the right. Remote T1 mild superior endplate compression fracture. Remote anterior wedge compression fracture T2 (involving superior and inferior endplate) with slight posterior bulging of posterior cortex. Findings are similar to 02/20/2018. Overall, no new fracture noted. Soft tissues and spinal canal: No abnormal prevertebral soft tissue swelling. Disc levels: Multilevel degenerative changes without high-grade spinal stenosis. Upper chest: No worrisome apical mass. Other: Carotid bifurcation calcifications. IMPRESSION: CT head: 1. Small left frontal scalp hematoma once again noted. No underlying skull fracture or intracranial hemorrhage. 2. Chronic microvascular changes and atrophy. 3. Persistent opacification right frontal sinus, right ethmoid sinus  air cells and right maxillary sinus with mild mucosal thickening left sphenoid sinus. CT CERVICAL SPINE: 1.  Overall, no new cervical spine fracture noted. 2. Fracture of the C4 posterior elements (spinous process/lamina junction) and fracture of the anterior inferior aspect of the C4 vertebral body without significant change. 3. Widening of the C4-5 disc interspace greatest anteriorly and minimal anterior slip C4 has been noted since 02/20/2018. It is therefore possible that widening of the C4-5 disc space is unrelated to recent trauma. Patient has a pacemaker in place limiting ability to get MR to exclude disc/ligamentous injury. 4. Remote C5 superior endplate compression fracture greater on the right. Remote T1 mild superior endplate compression fracture. Remote anterior wedge compression fracture T2 (involving superior and inferior endplate) with slight posterior bulging of posterior cortex. Findings are similar to 02/20/2018. 5. No abnormal prevertebral soft tissue swelling. 6. Multilevel degenerative changes without high-grade spinal stenosis. Electronically Signed   By: Lacy Duverney M.D.   On: 07/15/2018 15:21   Ct Head Wo Contrast  Result Date: 07/13/2018 CLINICAL DATA:  Fall from bed. EXAM: CT HEAD WITHOUT CONTRAST CT CERVICAL SPINE WITHOUT CONTRAST TECHNIQUE: Multidetector CT imaging of the head and cervical spine was performed following the standard protocol without intravenous contrast. Multiplanar CT image reconstructions of the cervical spine were also generated. COMPARISON:  Head CT 06/18/2018 CT cervical spine 06/12/2018 FINDINGS: CT HEAD FINDINGS Brain: There is no mass, hemorrhage or extra-axial collection. There is generalized atrophy without lobar predilection. There is hypoattenuation of the periventricular white matter, most commonly indicating chronic ischemic microangiopathy. Vascular: Atherosclerotic calcification of the internal carotid arteries at the skull base. No abnormal hyperdensity of the major intracranial arteries or dural venous sinuses. Skull: Small left frontal scalp hematoma.   No skull fracture. Sinuses/Orbits: Complete opacification of the right frontal, maxillary and anterior ethmoid sinuses. The orbits are normal. CT CERVICAL SPINE FINDINGS Alignment: No static subluxation. Facets are aligned. Occipital condyles are normally positioned. Skull base and vertebrae: No acute fracture. There are unchanged compression deformities of C5 and T2. Fractures through the posterior elements at C4 are unchanged. Soft tissues and spinal canal: No prevertebral fluid or swelling. No visible canal hematoma. Disc levels: No advanced spinal canal or neural foraminal stenosis. Upper chest: No pneumothorax, pulmonary nodule or pleural effusion. Other: Normal visualized paraspinal cervical soft tissues. IMPRESSION: 1. No acute intracranial abnormality. 2. Small left frontal scalp hematoma without skull fracture. 3. No acute fracture or static subluxation of the cervical spine. 4. Unchanged fracture through the posterior elements of C4 compared to 06/12/2018. Electronically Signed   By: Deatra Robinson M.D.   On: 07/13/2018 04:10   Ct Cervical Spine Wo Contrast  Result Date: 07/15/2018 CLINICAL DATA:  84 year old female with falls over the past 3 weeks. Decreased responsiveness. History of dementia. Cervical spine fracture. Patient removes C-collar. Subsequent encounter. EXAM: CT HEAD WITHOUT CONTRAST CT CERVICAL SPINE WITHOUT CONTRAST TECHNIQUE: Multidetector CT imaging of the head and cervical spine was performed following the standard protocol without intravenous contrast. Multiplanar CT image reconstructions of the cervical spine were also generated. COMPARISON:  Several prior exams including 06/12/2018, 06/06/2018, 06/13/2018, 06/12/2018, 05/28/2018 and 02/20/2018 CTs. FINDINGS: CT HEAD FINDINGS Brain: No intracranial hemorrhage or CT evidence of large acute infarct. Chronic microvascular changes. Global atrophy. No intracranial mass lesion noted on this unenhanced exam. Vascular: Vascular  calcifications.  No acute hyperdense vessel. Skull: No skull fracture. Sinuses/Orbits: No acute orbital abnormality. Opacification right frontal sinus, right ethmoid sinus air cells and  right maxillary sinus. Mild mucosal thickening left sphenoid sinus with adjacent bone thickening consistent with changes of chronic sinusitis. Mastoid air cells and middle ear cavities are clear. Other: Small left frontal scalp hematoma once again noted. CT CERVICAL SPINE FINDINGS Alignment/vertebra: Rotation of the head. Fracture of the C4 posterior elements (spinous process/lamina junction) and fracture of the anterior inferior aspect of the C4 vertebral body without significant change. Widening of the C4-5 disc interspace greatest anteriorly and minimal anterior slip C4 has been noted since 02/20/2018. It is therefore possible that widening of the C4-5 disc space may be unrelated to recent trauma. Patient has a pacemaker in place limiting ability to get MR to exclude disc/ligamentous injury. Remote C5 superior endplate compression fracture greater on the right. Remote T1 mild superior endplate compression fracture. Remote anterior wedge compression fracture T2 (involving superior and inferior endplate) with slight posterior bulging of posterior cortex. Findings are similar to 02/20/2018. Overall, no new fracture noted. Soft tissues and spinal canal: No abnormal prevertebral soft tissue swelling. Disc levels: Multilevel degenerative changes without high-grade spinal stenosis. Upper chest: No worrisome apical mass. Other: Carotid bifurcation calcifications. IMPRESSION: CT head: 1. Small left frontal scalp hematoma once again noted. No underlying skull fracture or intracranial hemorrhage. 2. Chronic microvascular changes and atrophy. 3. Persistent opacification right frontal sinus, right ethmoid sinus air cells and right maxillary sinus with mild mucosal thickening left sphenoid sinus. CT CERVICAL SPINE: 1. Overall, no new cervical  spine fracture noted. 2. Fracture of the C4 posterior elements (spinous process/lamina junction) and fracture of the anterior inferior aspect of the C4 vertebral body without significant change. 3. Widening of the C4-5 disc interspace greatest anteriorly and minimal anterior slip C4 has been noted since 02/20/2018. It is therefore possible that widening of the C4-5 disc space is unrelated to recent trauma. Patient has a pacemaker in place limiting ability to get MR to exclude disc/ligamentous injury. 4. Remote C5 superior endplate compression fracture greater on the right. Remote T1 mild superior endplate compression fracture. Remote anterior wedge compression fracture T2 (involving superior and inferior endplate) with slight posterior bulging of posterior cortex. Findings are similar to 02/20/2018. 5. No abnormal prevertebral soft tissue swelling. 6. Multilevel degenerative changes without high-grade spinal stenosis. Electronically Signed   By: Lacy DuverneySteven  Olson M.D.   On: 07/15/2018 15:21   Ct Cervical Spine Wo Contrast  Result Date: 07/13/2018 CLINICAL DATA:  Fall from bed. EXAM: CT HEAD WITHOUT CONTRAST CT CERVICAL SPINE WITHOUT CONTRAST TECHNIQUE: Multidetector CT imaging of the head and cervical spine was performed following the standard protocol without intravenous contrast. Multiplanar CT image reconstructions of the cervical spine were also generated. COMPARISON:  Head CT 06/18/2018 CT cervical spine 06/12/2018 FINDINGS: CT HEAD FINDINGS Brain: There is no mass, hemorrhage or extra-axial collection. There is generalized atrophy without lobar predilection. There is hypoattenuation of the periventricular white matter, most commonly indicating chronic ischemic microangiopathy. Vascular: Atherosclerotic calcification of the internal carotid arteries at the skull base. No abnormal hyperdensity of the major intracranial arteries or dural venous sinuses. Skull: Small left frontal scalp hematoma.  No skull  fracture. Sinuses/Orbits: Complete opacification of the right frontal, maxillary and anterior ethmoid sinuses. The orbits are normal. CT CERVICAL SPINE FINDINGS Alignment: No static subluxation. Facets are aligned. Occipital condyles are normally positioned. Skull base and vertebrae: No acute fracture. There are unchanged compression deformities of C5 and T2. Fractures through the posterior elements at C4 are unchanged. Soft tissues and spinal canal: No prevertebral fluid or  swelling. No visible canal hematoma. Disc levels: No advanced spinal canal or neural foraminal stenosis. Upper chest: No pneumothorax, pulmonary nodule or pleural effusion. Other: Normal visualized paraspinal cervical soft tissues. IMPRESSION: 1. No acute intracranial abnormality. 2. Small left frontal scalp hematoma without skull fracture. 3. No acute fracture or static subluxation of the cervical spine. 4. Unchanged fracture through the posterior elements of C4 compared to 06/12/2018. Electronically Signed   By: Deatra Robinson M.D.   On: 07/13/2018 04:10   Dg Chest Portable 1 View  Result Date: 07/15/2018 CLINICAL DATA:  Altered mental status, hypertension. EXAM: PORTABLE CHEST 1 VIEW COMPARISON:  Chest x-rays dated 06/14/2018, 05/30/2018 and 02/20/2018. FINDINGS: Stable cardiomegaly. Mild interstitial prominence, RIGHT greater than LEFT, suggesting mild interstitial edema. No pleural effusion or pneumothorax seen. LEFT chest wall pacemaker/ICD leads appear stable in position. Again noted is the displaced fracture of the RIGHT humeral neck. Osseous structures about the chest are otherwise unremarkable. IMPRESSION: 1. Cardiomegaly. Mild interstitial prominence, most likely interstitial edema, suspicious for mild CHF. 2. Chronic appearing fracture at the RIGHT humeral neck. Electronically Signed   By: Bary Richard M.D.   On: 07/15/2018 13:56   Dg Hip Port Unilat W Or Wo Pelvis 1 View Left  Result Date: 07/15/2018 CLINICAL DATA:  Fall,  LEFT hip bruising, pain. EXAM: DG HIP (WITH OR WITHOUT PELVIS) 1V PORT LEFT COMPARISON:  Plain films of the pelvis and LEFT hip dated 07/13/2018, 04/24/2017 and 12/04/2015. FINDINGS: Osseous alignment is stable. No fracture line or displaced fracture fragment appreciated. No significant degenerative change at either hip joint. Prominent vascular calcifications throughout the soft tissues of the pelvis and upper thighs. IMPRESSION: No acute findings. No osseous fracture or dislocation. Electronically Signed   By: Bary Richard M.D.   On: 07/15/2018 13:58   Dg Hip Unilat With Pelvis 2-3 Views Left  Result Date: 07/13/2018 CLINICAL DATA:  Fall from bed while sleeping. EXAM: DG HIP (WITH OR WITHOUT PELVIS) 2-3V LEFT COMPARISON:  03/22/2018 FINDINGS: There is no evidence of hip fracture or dislocation. There is no evidence of arthropathy or other focal bone abnormality. IMPRESSION: No fracture or dislocation of the pelvis or left hip. Electronically Signed   By: Deatra Robinson M.D.   On: 07/13/2018 03:58      Subjective: - no chest pain, shortness of breath, no abdominal pain, nausea or vomiting.   Discharge Exam: Vitals:   07/18/18 2312 07/19/18 0944  BP: (!) 113/51 140/64  Pulse: 69 84  Resp: 10 12  Temp: 98 F (36.7 C) 97.8 F (36.6 C)  SpO2:  98%    General: Pt is confused Cardiovascular: irregular Respiratory: CTA bilaterally, no wheezing, no rhonchi  The results of significant diagnostics from this hospitalization (including imaging, microbiology, ancillary and laboratory) are listed below for reference.     Microbiology: Recent Results (from the past 240 hour(s))  Urine culture     Status: Abnormal   Collection Time: 07/15/18  3:43 PM  Result Value Ref Range Status   Specimen Description URINE, RANDOM  Final   Special Requests   Final    NONE Performed at [facility], [address] [place], [place] [address]    Culture >=100,000 COLONIES/mL ENTEROCOCCUS FAECALIS  (A)  Final   Report Status 07/17/2018 FINAL  Final   Organism ID, Bacteria ENTEROCOCCUS FAECALIS (A)  Final      Susceptibility   Enterococcus faecalis - MIC*    AMPICILLIN <=2 SENSITIVE Sensitive  LEVOFLOXACIN >=8 RESISTANT Resistant     NITROFURANTOIN <=16 SENSITIVE Sensitive     VANCOMYCIN 1 SENSITIVE Sensitive     * >=100,000 COLONIES/mL ENTEROCOCCUS FAECALIS  Culture, blood (routine x 2)     Status: None (Preliminary result)   Collection Time: 07/15/18 11:55 PM  Result Value Ref Range Status   Specimen Description BLOOD LEFT ANTECUBITAL  Final   Special Requests   Final    BOTTLES DRAWN AEROBIC ONLY Blood Culture adequate volume   Culture   Final    NO GROWTH 3 DAYS Performed at [facility], [address], [place], [place] [address]    Report Status PENDING  Incomplete  Culture, blood (routine x 2)     Status: None (Preliminary result)   Collection Time: 07/16/18 12:00 AM  Result Value Ref Range Status   Specimen Description BLOOD LEFT ANTECUBITAL  Final   Special Requests   Final    BOTTLES DRAWN AEROBIC ONLY Blood Culture results may not be optimal due to an inadequate volume of blood received in culture bottles   Culture   Final    NO GROWTH 3 DAYS Performed at [facility], [address], [place], [place] [address]    Report Status PENDING  Incomplete  MRSA PCR Screening     Status: None   Collection Time: 07/16/18  2:28 PM  Result Value Ref Range Status   MRSA by PCR NEGATIVE NEGATIVE Final    Comment:        The GeneXpert MRSA Assay (FDA approved for NASAL specimens only), is one component of a comprehensive MRSA colonization surveillance program. It is not intended to diagnose MRSA infection nor to guide or monitor treatment for MRSA infections. Performed at [facility], [address] [place], [place] [address]      Labs: BNP (last 3 results) Recent Labs    03/12/18 0209 03/27/18 1804  BNP 109.8* 231.1*   Basic Metabolic  Panel: Recent Labs  Lab 07/13/18 0241 07/15/18 1501 07/15/18 1507 07/15/18 2219 07/16/18 0000 07/17/18 0248  NA 142 142 142  --  142 140  K 4.4 5.3* 5.0  --  4.2 4.3  CL 111 114* 113*  --  110 112*  CO2 26 21*  --   --  25 23  GLUCOSE 96 97 91  --  90 91  BUN 28* 26* 25*  --  21 22  CREATININE 0.99 1.23* 1.20* 1.12* 1.04* 1.06*  CALCIUM 8.6* 8.4*  --   --  8.2* 8.2*  MG  --   --   --   --   --  2.0   Liver Function Tests: Recent Labs  Lab 07/17/18 0248  AST 24  ALT 19  ALKPHOS 134*  BILITOT 0.5  PROT 6.2*  ALBUMIN 2.8*   No results for input(s): LIPASE, AMYLASE in the last 168 hours. Recent Labs  Lab 07/15/18 2219  AMMONIA 16   CBC: Recent Labs  Lab 07/13/18 0241 07/15/18 1501 07/15/18 1507 07/15/18 2219 07/16/18 0000  WBC 8.0 9.0  --  8.3 8.3  NEUTROABS 5.3 6.4  --   --   --   HGB 9.9* 8.0* 8.2* 8.3* 8.2*  HCT 32.7* 27.2* 24.0* 27.2* 27.3*  MCV 102.8* 107.1*  --  102.3* 102.6*  PLT 219 176  --  162 162   Cardiac Enzymes: No results for input(s): CKTOTAL, CKMB, CKMBINDEX, TROPONINI in the last 168 hours. BNP: Invalid input(s): POCBNP  CBG: No results for input(s): GLUCAP in the last 168 hours. D-Dimer No results for input(s): DDIMER in the last 72 hours. Hgb A1c No results for input(s): HGBA1C in the last 72 hours. Lipid Profile No results for input(s): CHOL, HDL, LDLCALC, TRIG, CHOLHDL, LDLDIRECT in the last 72 hours. Thyroid function studies No results for input(s): TSH, T4TOTAL, T3FREE, THYROIDAB in the last 72 hours.  Invalid input(s): FREET3 Anemia work up No results for input(s): VITAMINB12, FOLATE, FERRITIN, TIBC, IRON, RETICCTPCT in the last 72 hours. Urinalysis    Component Value Date/Time   COLORURINE YELLOW 07/15/2018 1326   APPEARANCEUR HAZY (A) 07/15/2018 1326   LABSPEC 1.020 07/15/2018 1326   PHURINE 5.0 07/15/2018 1326   GLUCOSEU NEGATIVE 07/15/2018 1326   HGBUR NEGATIVE 07/15/2018 1326   BILIRUBINUR NEGATIVE 07/15/2018 1326     KETONESUR NEGATIVE 07/15/2018 1326   PROTEINUR NEGATIVE 07/15/2018 1326   NITRITE NEGATIVE 07/15/2018 1326   LEUKOCYTESUR LARGE (A) 07/15/2018 1326   Sepsis Labs Invalid input(s): PROCALCITONIN,  WBC,  LACTICIDVEN   Time coordinating discharge: 25 minutes  SIGNED:  Pamella Pert, MD  [facility] 07/19/2018, 1:52 PM Pager [phone]  If 7PM-7AM, please contact night-coverage [email or web address] Password [email or web address]

## 2018-07-19 NOTE — Clinical Social Work Note (Signed)
Clinical Social Work Assessment  Patient Details  Name: Jennifer Bender MRN: [id number] Date of Birth: [birth year]  Date of referral:  07/19/18               Reason for consult:  Facility Placement                Permission sought to share information with:    Permission granted to share information::     Name::     Lollie SailsHarry  Agency::  [facility]  Relationship::  Son  SolicitorContact Information:     Housing/Transportation Living arrangements for the past 2 months:  [facility]illed Building survey[facility]ursing Facility Source of Information:  Adult Children Patient Interpreter Needed:  None Criminal Activity/Legal Involvement Pertinent to Current Situation/Hospitalization:  No - Comment as needed Significant Relationships:  Adult Children Lives with:  Facility Resident Do you feel safe going back to the place where you live?  No Need for family participation in patient care:  No (Coment)  Care giving concerns:  Pt is only alert to self. CSW spoke with pt's son via telephone.   Social Worker assessment / plan:  CSW spoke with pt's son via telephone. [facility] However, pt was at [facility] (SNF) prior to admission. Pt's son states they are waiting to speak with Pallative to determine plan. However, if pt is to return to SNF then pt's son confirms he would be agreeable to [facility]. CSW will continue to follow for disposition.  Employment status:  Retired Database administratornsurance information:  Managed Medicare PT Recommendations:  Skilled Nursing Facility Information / Referral to community resources:  Skilled Nursing Facility  Patient/Family's Response to care:  Pt's son verbalized understanding of CSW role and expressed appreciation for support. Pt's son denies any concern regarding pt care at this time.   Patient/Family's Understanding of and Emotional Response to Diagnosis, Current Treatment, and Prognosis:  Pt's son understanding and realistic regarding pt's  physical limitations. Pt's son  understands the need for palliative to determine GOC. Pt agreeable to SNF placement at d/c--if this is what they determine after meeting with pallative. Pt's son denies any concern regarding pt's treatment plan at this time. CSW will continue to provide support and facilitate d/c needs.   Emotional Assessment Appearance:  Appears stated age Attitude/Demeanor/Rapport:  Unable to Assess Affect (typically observed):  Unable to Assess Orientation:  Oriented to Self Alcohol / Substance use:  Not Applicable Psych involvement (Current and /or in the community):  No (Comment)  Discharge Needs  Concerns to be addressed:  Care Coordination, Basic Needs Readmission within the last 30 days:  Yes Current discharge risk:  Dependent with Mobility Barriers to Discharge:  Continued Medical Work up   Pacific MutualBridget A Amazing Cowman, LCSW 07/19/2018, 9:15 AM

## 2018-07-19 NOTE — Consult Note (Addendum)
Consultation Note Date: 07/19/2018   Patient Name: Jennifer Bender  DOB: Jan 12, 1924  MRN: [id number]  Age / Sex: 84 y.o., female  PCP: Default, Provider, MD Referring Physician: Caren Griffins, MD  Reason for Consultation: Establishing goals of care, Pain control and Psychosocial/spiritual support  HPI/Patient Profile: 84 y.o. female "[name]" with past medical history of dementia, CHF, Afib, intraventricular hemorrhage, multiple falls with cervical neck fracture in October of 2019, CKD 4, PNA who was admitted on 07/15/2018 with encephalopathy secondary to UTI.  She has received treatment for the UTI but her encephalopathy has not improved.  Family is concerned that she is unable to rehab.  Clinical Assessment and Goals of Care:  I have reviewed medical records including EPIC notes, labs and imaging, received report from bedside RN and attending MD, assessed the patient and then met at the bedside along with her grandchildren Jennifer Bender and Jennifer Bender  to discuss diagnosis prognosis, GOC, EOL wishes, disposition and options.  The patient has two children Jennifer Bender and Jennifer Bender.  For approximately 8 years Jennifer Bender lived with his mother and was her primary care taker.  For the last 6 months the patient has lived in nursing facilities.  Jennifer Bender is unable to care for her at home.  Per the grandchildren Jennifer Bender was requesting SNF for his mother as he did not know what else to do.  Approximately 6 weeks ago the family requested that [name] be evaluated for Hospice.    As far as functional and nutritional status [name] is now unable to stand.  She is no longer hungry but will take pills with applesauce when encouraged to do so. She is minimally interactive, not lifting her head, not smiling.  Often not recognizing family members.    Family is very concerned that she is near EOL and she received specialized care for EOL.  We discussed Hospice  House.  Per family [facility] is already familiar with the patient.    I attempted to elicit values and goals of care important to the patient.  Her comfort is of highest priority to her family.  The difference between aggressive medical intervention and comfort care was considered in light of the patient's goals of care.   Advanced directives, concepts specific to code status, artifical feeding and hydration, and rehospitalization were considered and discussed.  She is DNR.  No aggressive measures.    Primary Decision Maker:  NEXT OF KIN Jennifer Bender and Jennifer Bender are the two adult children.  I spoke with the 3 grandchildren who are representing Jennifer Bender and Jennifer Bender.    SUMMARY OF RECOMMENDATIONS    Will d/c maintenance fluids and medications and focus on comfort. D/C to [facility] when bed is available.  Family requests [facility]   Code Status/Advance Care Planning:  DNR   Symptom Management:  Scheduled tylenol for - bone pain - left rotator cuff, left leg, fractured neck.   Patient is highly sensitive to medications.  Additional Recommendations (Limitations, Scope, Preferences):  Full Comfort Care  Palliative Prophylaxis:  Frequent Pain Assessment   Prognosis:  Less than two weeks. Patient has advanced dementia with a fractured neck.  She has declined rapidly in the last month.  She no longer lifts her head, smiles, or has a desire to eat.  She will deteriorate rapidly without artificial support of IVF in the setting of stage 4 CKD.   Discharge Planning: Hospice facility      Primary Diagnoses: Present on Admission: . Acute metabolic encephalopathy . Atrial fibrillation ([place]) . C4 cervical fracture ([place]) . CKD (chronic kidney disease) stage 4, GFR 15-29 ml/min (HCC) . HTN (hypertension) . Hypothyroidism . Malnutrition of moderate degree . Acute lower UTI   I have reviewed the medical record, interviewed the patient and family, and examined the patient. The  following aspects are pertinent.  Past Medical History:  Diagnosis Date  . Arthritis   . Atrial fibrillation ([place])   . CHF (congestive heart failure) ([place])   . Hypertension   . Pneumonia   . UTI (lower urinary tract infection)    Social History   Socioeconomic History  . Marital status: Married    Spouse name: Not on file  . Number of children: Not on file  . Years of education: Not on file  . Highest education level: Not on file  Occupational History  . Not on file  Social Needs  . Financial resource strain: Not on file  . Food insecurity:    Worry: Not on file    Inability: Not on file  . Transportation needs:    Medical: Not on file    Non-medical: Not on file  Tobacco Use  . Smoking status: Never Smoker  . Smokeless tobacco: Never Used  Substance and Sexual Activity  . Alcohol use: No  . Drug use: No  . Sexual activity: Not on file  Lifestyle  . Physical activity:    Days per week: Not on file    Minutes per session: Not on file  . Stress: Not on file  Relationships  . Social connections:    Talks on phone: Not on file    Gets together: Not on file    Attends religious service: Not on file    Active member of club or organization: Not on file    Attends meetings of clubs or organizations: Not on file    Relationship status: Not on file  Other Topics Concern  . Not on file  Social History Narrative  . Not on file   No family history on file. Scheduled Meds: . acetaminophen (TYLENOL) oral liquid 160 mg/5 mL  1,000 mg Oral Q8H  . amoxicillin  500 mg Oral Q12H  . diltiazem  180 mg Oral Daily  . DULoxetine  30 mg Oral Daily  . sodium chloride flush  3 mL Intravenous Q12H   Continuous Infusions: . sodium chloride     PRN Meds:.sodium chloride, acetaminophen, haloperidol **OR** haloperidol **OR** haloperidol lactate, LORazepam, ondansetron **OR** ondansetron (ZOFRAN) IV, sodium chloride flush Allergies  Allergen Reactions  . Ace Inhibitors      Angioedema   . Colchicine     unknown  . Crestor [Rosuvastatin Calcium]     Myalgias   . Ezetimibe Other (See Comments)  . Hydrocodone-Acetaminophen Nausea Only  . Lipitor [Atorvastatin Calcium]     Myalgias   . Morphine And Related Other (See Comments)    Unknown  . Nsaids Nausea And Vomiting    Renal insufficiency  Renal sufficient  . Pravachol Nausea Only  .  Rosuvastatin Other (See Comments)    Unknown  . Shellfish Allergy Other (See Comments)  . Tolmetin Other (See Comments)    Renal insufficiency   . Tramadol Nausea Only  . Zocor [Simvastatin]     Myalgias    Review of Systems advanced dementia  Physical Exam  Frail, elderly, advanced dementia does not want to be touched or moved. CV rrr with murmur Respiration no distress Abdomen tender to palpation   Vital Signs: BP 140/64 (BP Location: Right Arm)   Pulse 84   Temp 97.8 F (36.6 C) (Oral)   Resp 12   Ht '5\' 6"'  (1.676 m)   Wt 55.6 kg   SpO2 98%   BMI 19.78 kg/m  Pain Scale: Faces POSS *See Group Information*: 1-Acceptable,Awake and alert Pain Score: Asleep   SpO2: SpO2: 98 % O2 Device:SpO2: 98 % O2 Flow Rate: .   IO: Intake/output summary:   Intake/Output Summary (Last 24 hours) at 07/19/2018 1118 Last data filed at 07/19/2018 0914 Gross per 24 hour  Intake -  Output 125 ml  Net -125 ml    LBM: Last BM Date: 07/16/18 Baseline Weight: Weight: 55 kg Most recent weight: Weight: 55.6 kg     Palliative Assessment/Data: 10%     Time In: 10:30 Time Out: 11:32 Time Total: 62 min. Greater than 50%  of this time was spent counseling and coordinating care related to the above assessment and plan.  Signed by: Florentina Jenny, PA-C Palliative Medicine Pager: [phone]  Please contact Palliative Medicine Team phone at [phone] for questions and concerns.  For individual provider: See Shea Evans

## 2018-07-19 NOTE — Clinical Social Work Note (Addendum)
CSW spoke with pt's son via telephone to determine d/c plan for pt. Pt's son states he is waiting to meet with Pallative and would determine a plan from that conversation. Pt's son did agree to pt going back to [facility] if that is what they determine as plan for d/c. CSW has completed workup and will continue to follow for determined GOC.  CSW received a call from son and he would like for CSW to send [facility] as [facility] is closer [facility] his home.  CummingBridget Kadon Andrus, [place]WA [phone]

## 2018-07-19 NOTE — Consult Note (Signed)
[facility]: Evaluated pt for inpatient hospice. Approved for admission by Dr. Oleta MouseAmy Baruch. Request transfer at 4pm. SW Clarisse GougeBridget to place DNR, Discharge summary and medical necessity form in packet and call for transport. Consents signed by family and packet on shadow chart. Thank you for the opportunity to serve this patient and family.  Ronette Deteroxanne Rothrock, RN [place] SpringsCHPN

## 2018-07-19 NOTE — Clinical Social Work Note (Addendum)
CSW received notification from MD that pt is not hospice and family prefers [facility]. [facility]. Awaiting decision.  1:53-- [facility] has accepted pt. MD notified. Pt will transfer today. PTAR set for 4:00.  DoniphanBridget Fenix Rorke, [place]CSWA [phone]

## 2018-07-21 LAB — CULTURE, BLOOD (ROUTINE X 2)
CULTURE: NO GROWTH
CULTURE: NO GROWTH
Special Requests: ADEQUATE

## 2018-08-21 DEATH — deceased

## 2019-03-18 IMAGING — CT CT CERVICAL SPINE W/O CM
3 series · 15 of 27 positions shown, 17 images · non-contrast
Comparison: Cervical spine CT scan 02/20/2018.

CLINICAL DATA: [AGE] female with history of unwitnessed fall.
Dementia.

EXAM:
CT CERVICAL SPINE WITHOUT CONTRAST
TECHNIQUE: Multidetector CT imaging of the cervical spine was performed without
intravenous contrast. Multiplanar CT image reconstructions were also
generated.

[Series 4: c spine soft · axial · 0.50mm/px · z∈[-216,-86]mm · 6 of 93 slices shown]
[im 14/93  soft-tissue]
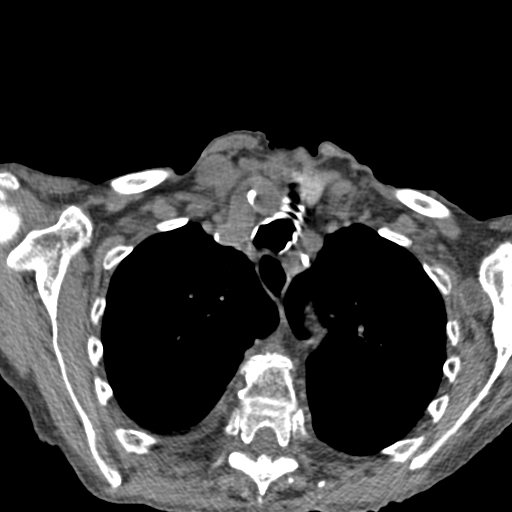
[im 27/93  soft-tissue]
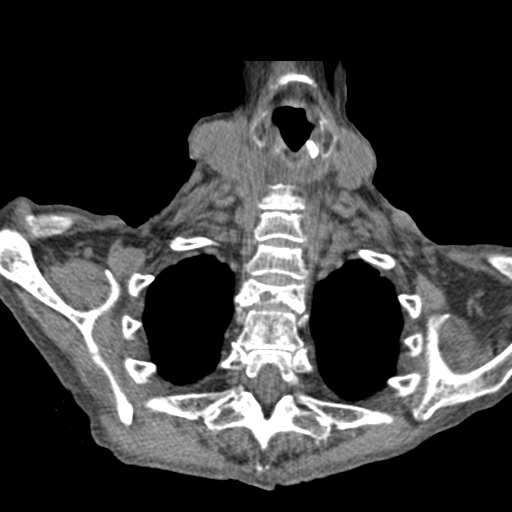
[im 40/93  soft-tissue]
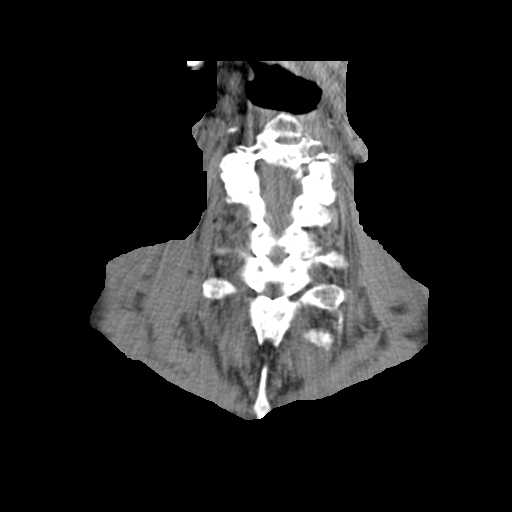
[im 53/93  soft-tissue]
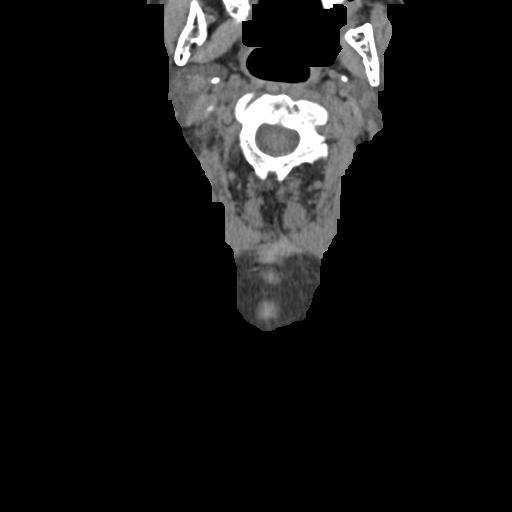
[im 66/93  soft-tissue]
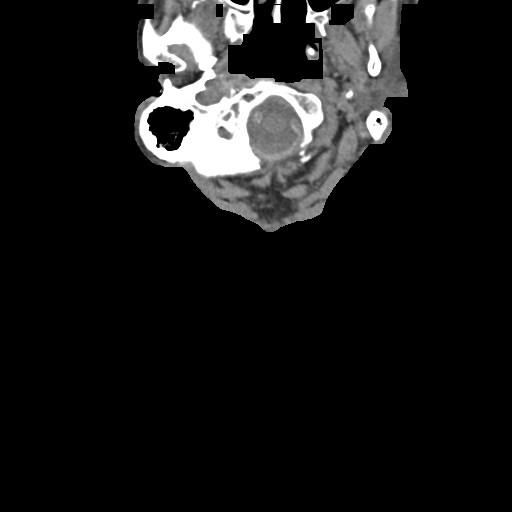
[im 79/93  soft-tissue]
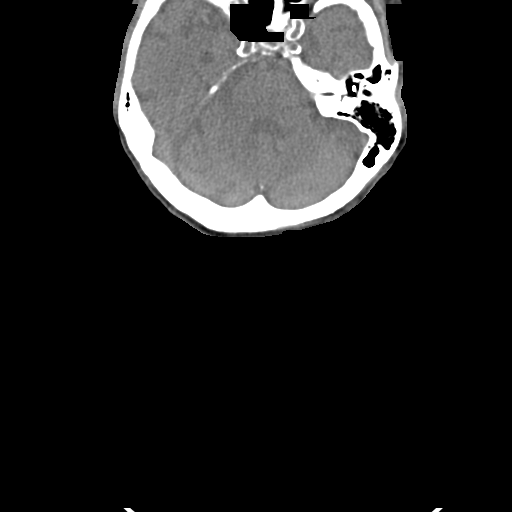

[Series 602: coronal · axial · 0.50mm/px · z∈[-153,-88]mm · 4 of 61 slices shown, 5 images]
[im 13/61  soft-tissue]
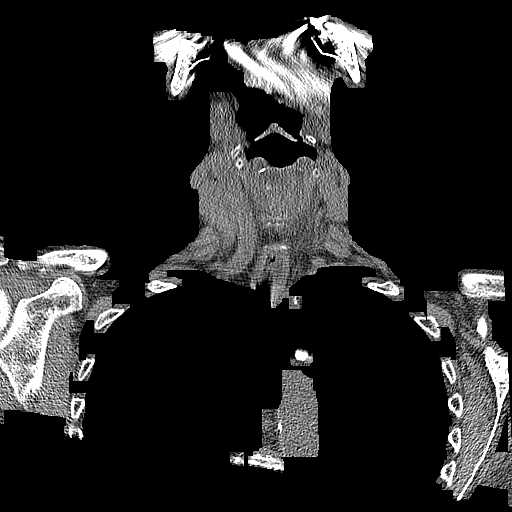
[im 13/61  bone]
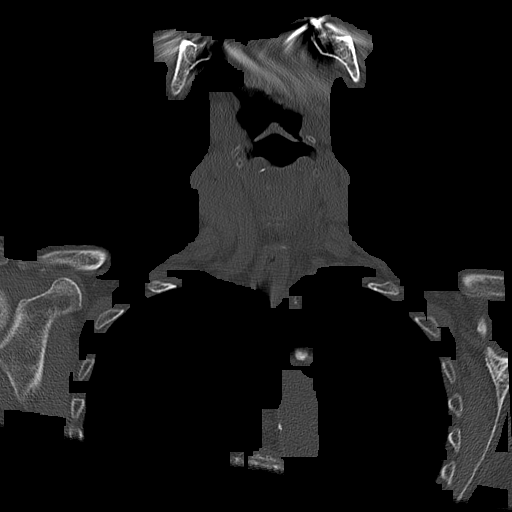
[im 25/61  bone]
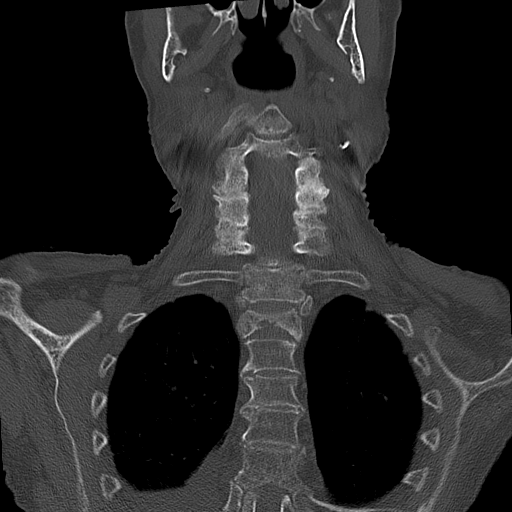
[im 37/61  bone]
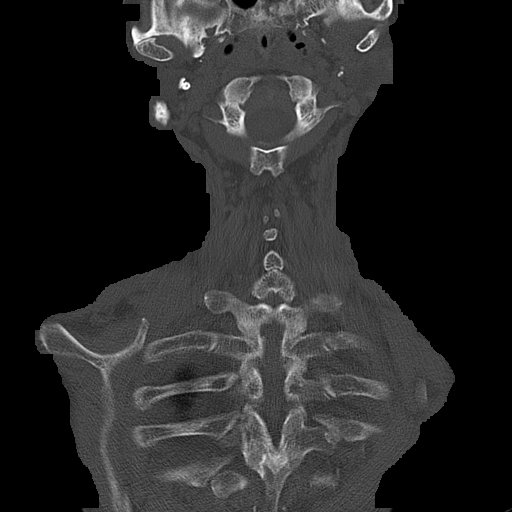
[im 49/61  bone]
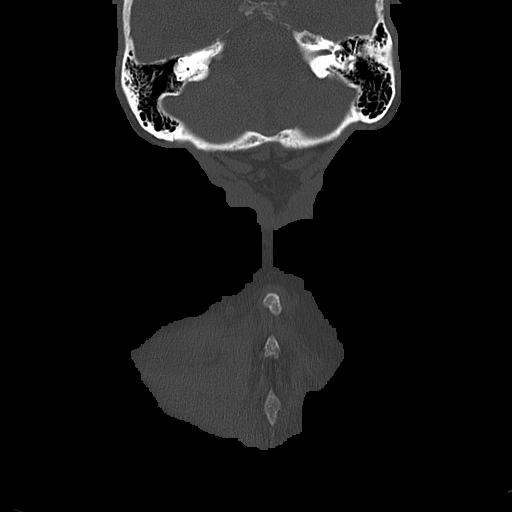

[Series 604: sagittal · sagittal · 0.50mm/px · 5 of 70 slices shown, 6 images]
[im 24/70  bone]
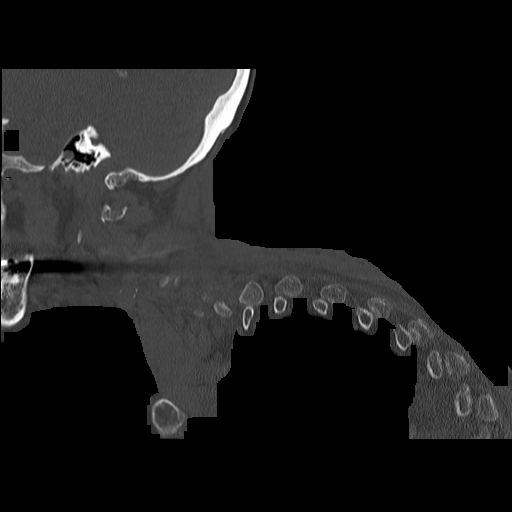
[im 29/70  bone]
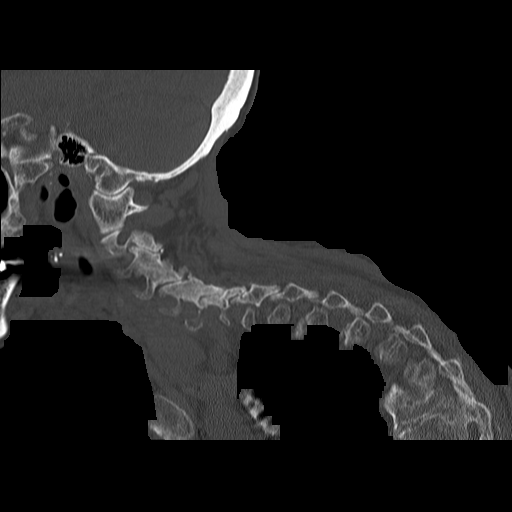
[im 35/70  soft-tissue]
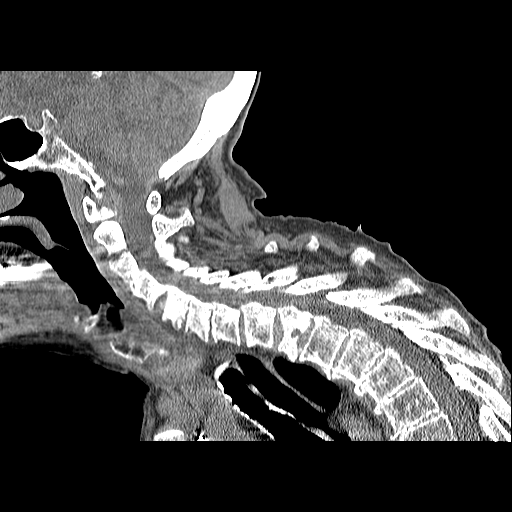
[im 35/70  bone]
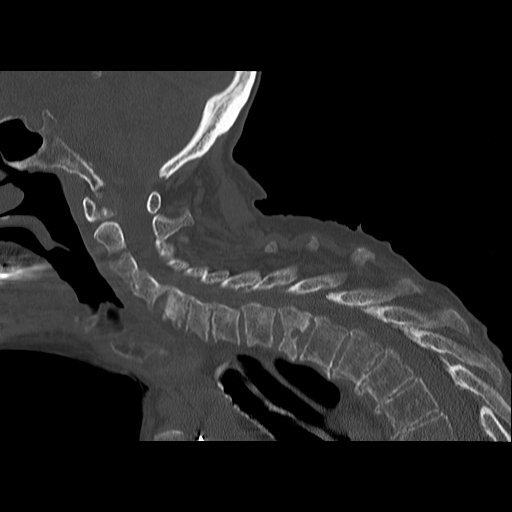
[im 41/70  bone]
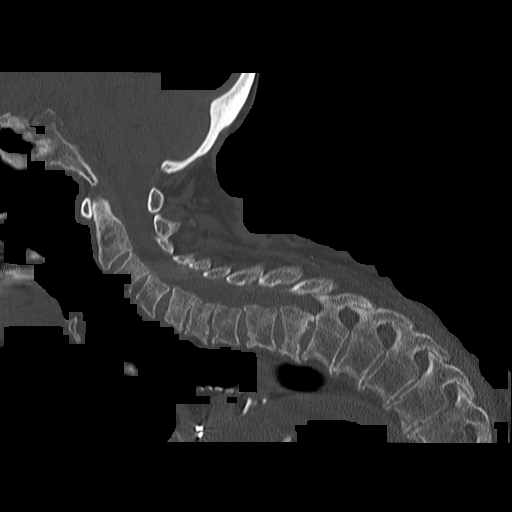
[im 47/70  bone]
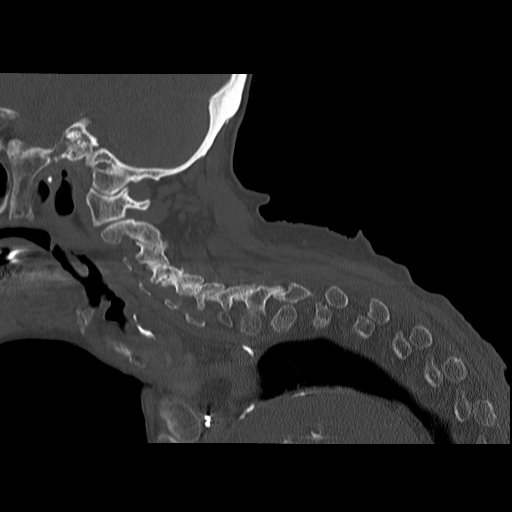

[15 of 27 positions shown; findings below may reference images not displayed]

FINDINGS: Alignment: Normal.

Skull base and vertebrae: Chronic compression fractures of C5 and T2
are again noted, most severe at T1 where there is 40% loss of
anterior vertebral body height. No other definite acute displaced
fractures are identified on today's examination.

Soft tissues and spinal canal: No prevertebral fluid or swelling. No
visible canal hematoma.

Disc levels: Multilevel degenerative disc disease, most severe at
C5-C6 and C6-C7. Mild multilevel facet arthropathy.

Upper chest: Moderate right and small left pleural effusions are
incompletely imaged.

Other: None.
IMPRESSION: 1. No evidence of significant acute traumatic injury to the cervical
spine.
2. Chronic compression fractures of C5 and T2 are unchanged.
Multilevel degenerative disc disease and cervical spondylosis,
similar to the prior study.
3. Moderate right and small left pleural effusions incompletely
imaged.

## 2019-03-27 IMAGING — DX DG CHEST 2V
2 series · 2 of 2 positions shown · non-contrast
Comparison: 02/20/2018

CLINICAL DATA: Chest pain today. History of CHF and pacemaker.
Shortness of breath.

EXAM:
CHEST - 2 VIEW

[x chest ap]
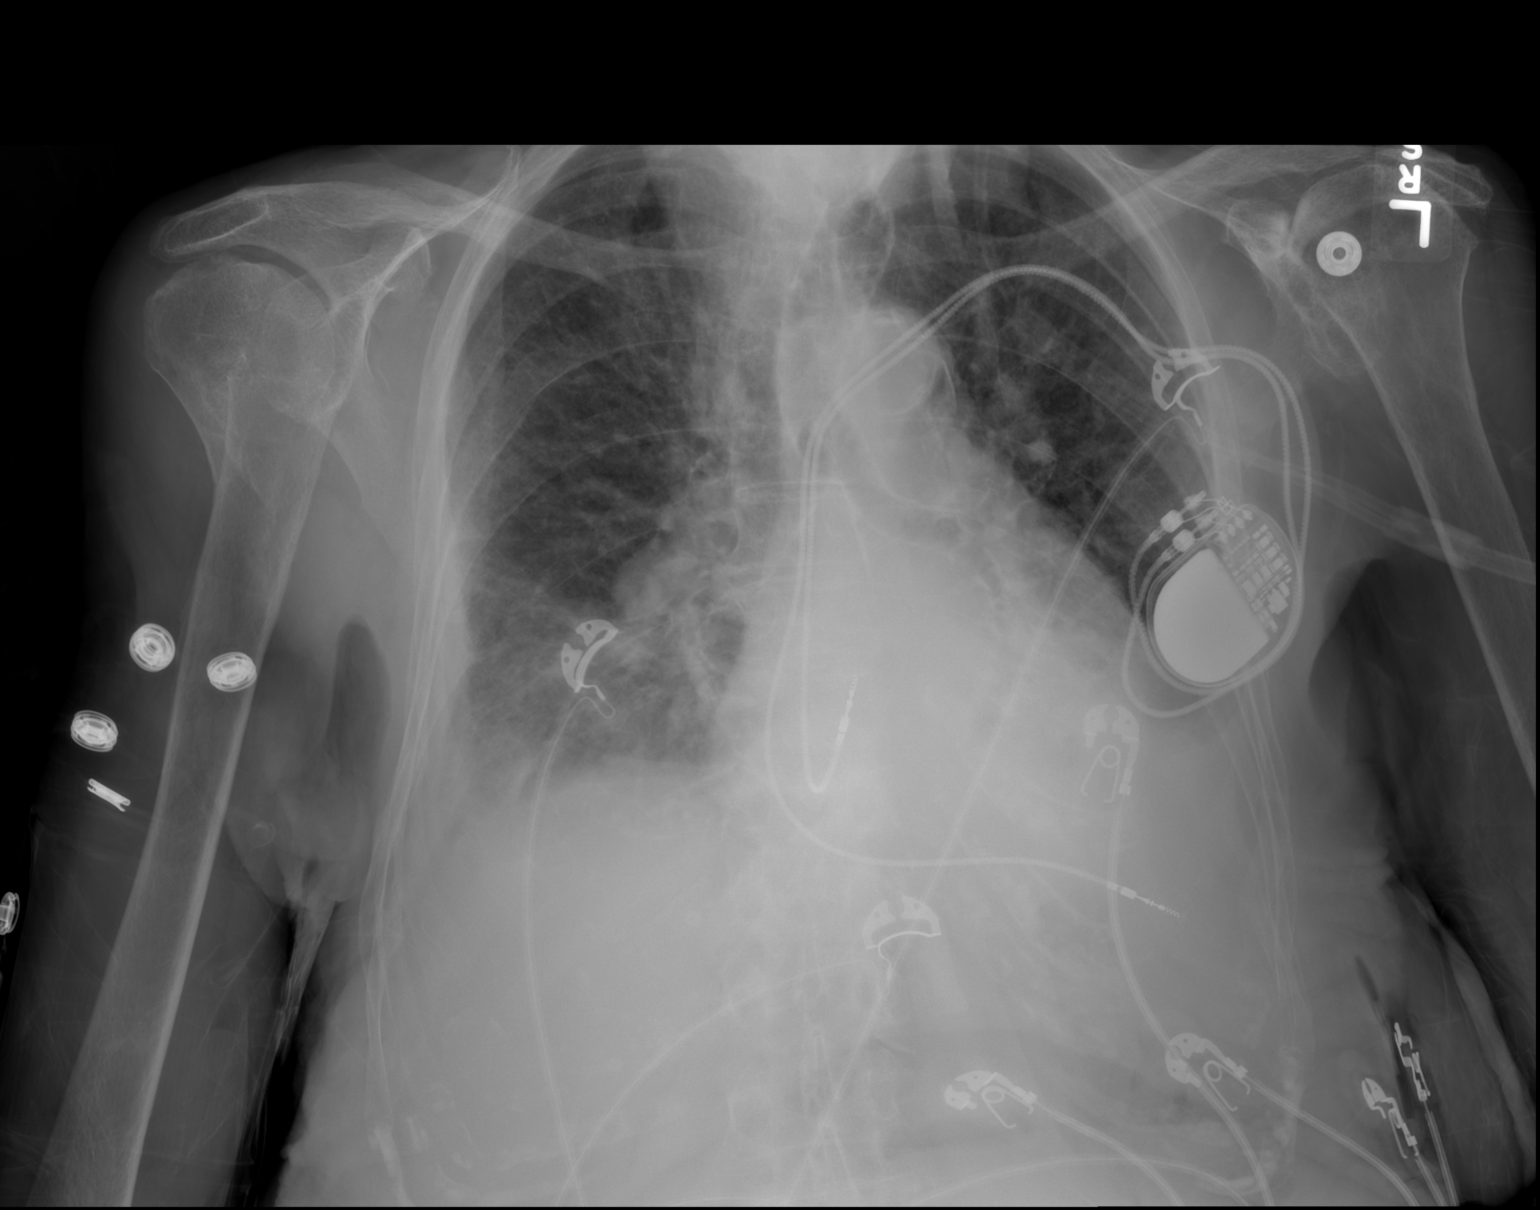

[w chest lat]
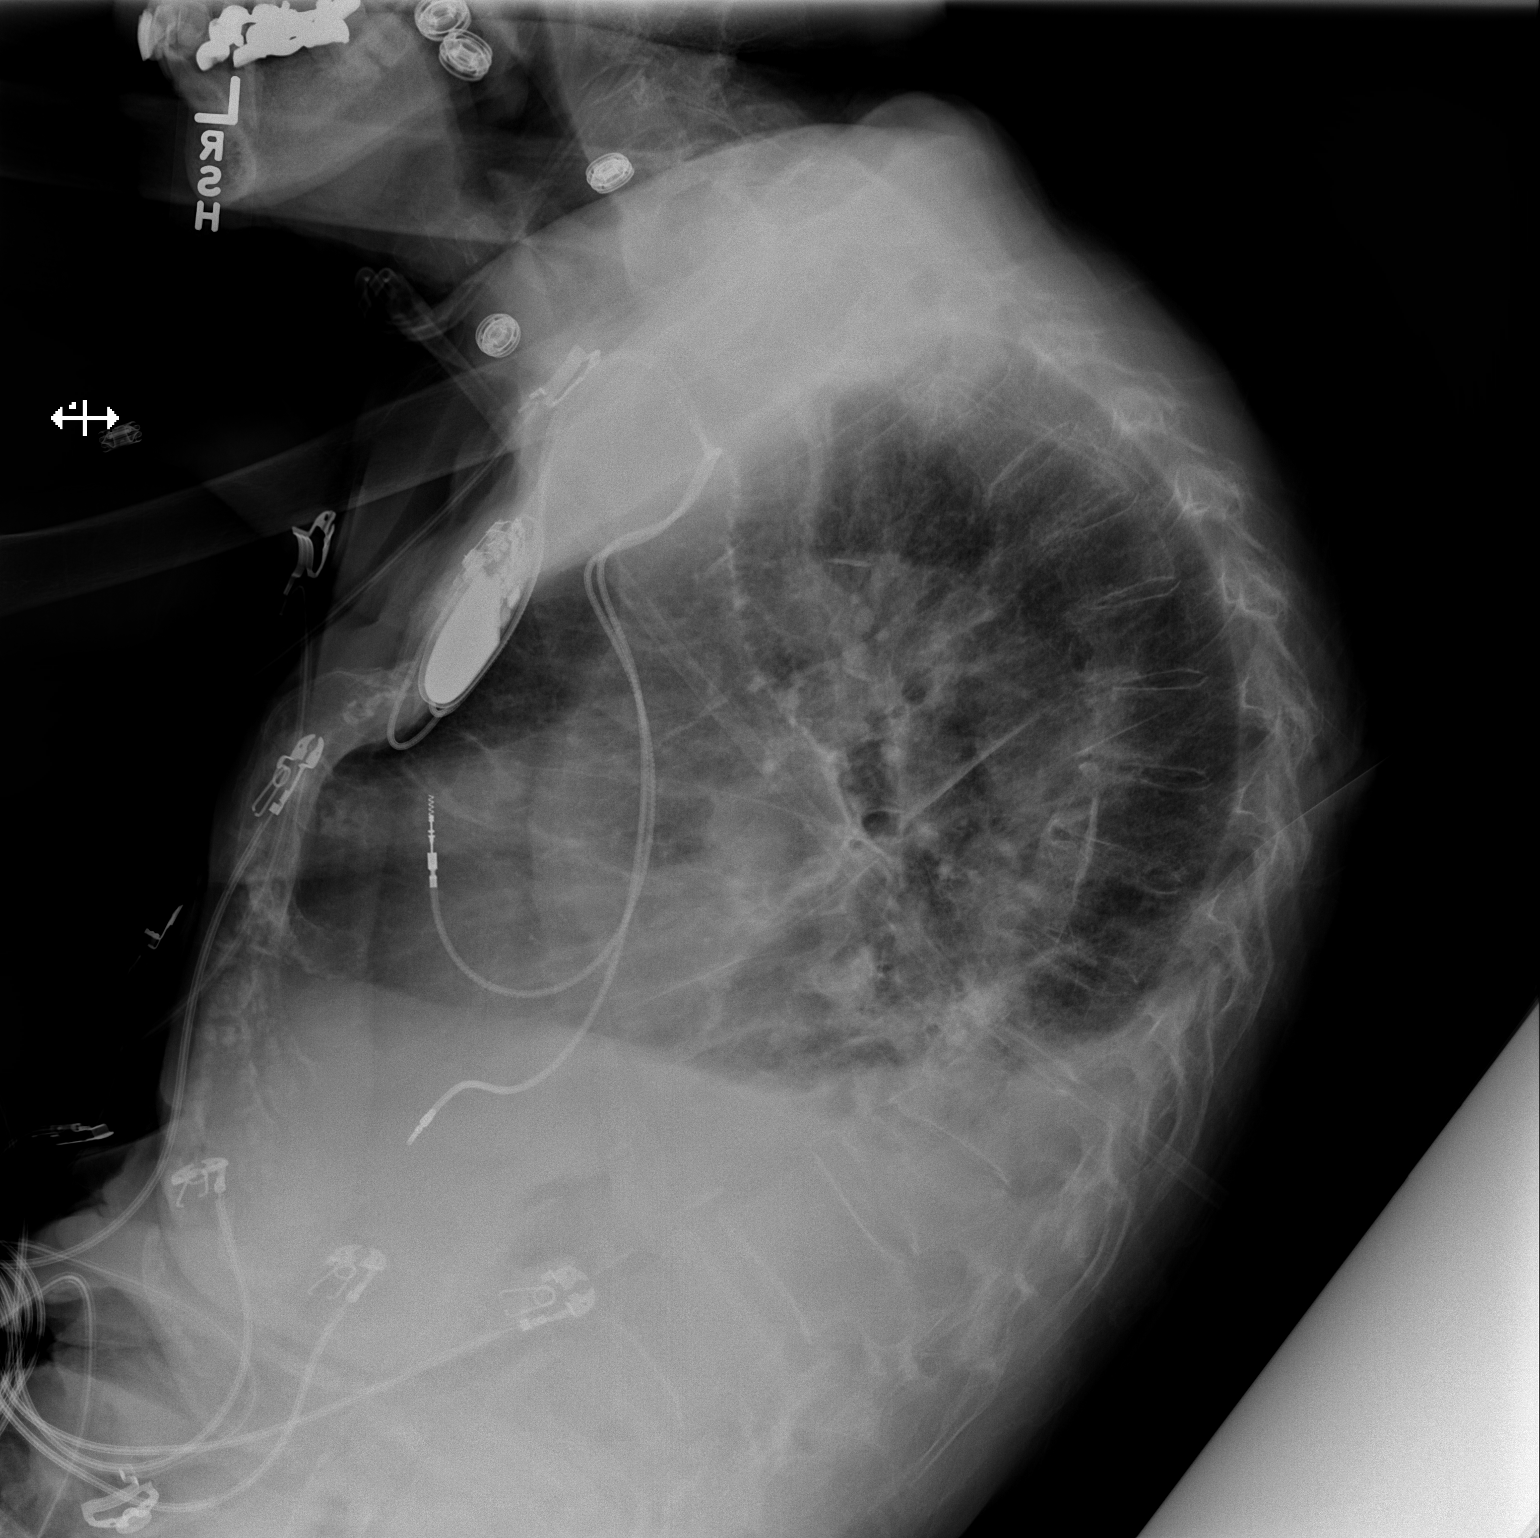

[2 of 2 positions shown; findings below may reference images not displayed]

FINDINGS: Cardiac pacemaker. Shallow inspiration. Cardiac enlargement with
pulmonary vascular congestion. Small bilateral pleural effusions are
developing since previous study. Basilar atelectasis. No definite
edema or consolidation. No pneumothorax. Calcification of the aorta.
Degenerative changes in the spine and shoulders. Old fracture
deformity of the proximal right humerus.
IMPRESSION: Cardiac enlargement with pulmonary vascular congestion and small
bilateral pleural effusions.
